# Patient Record
Sex: Male | Born: 1940 | Race: White | Hispanic: No | Marital: Married | Smoking: Never smoker
Health system: Southern US, Community
[De-identification: ages and names within clinical notes are randomized; demographics above are authoritative.]

## PROBLEM LIST (undated history)

## (undated) DIAGNOSIS — I10 Essential (primary) hypertension: Secondary | ICD-10-CM

## (undated) DIAGNOSIS — C189 Malignant neoplasm of colon, unspecified: Secondary | ICD-10-CM

## (undated) DIAGNOSIS — R1310 Dysphagia, unspecified: Secondary | ICD-10-CM

## (undated) DIAGNOSIS — I493 Ventricular premature depolarization: Secondary | ICD-10-CM

## (undated) DIAGNOSIS — E079 Disorder of thyroid, unspecified: Secondary | ICD-10-CM

## (undated) DIAGNOSIS — N183 Chronic kidney disease, stage 3 unspecified: Secondary | ICD-10-CM

## (undated) DIAGNOSIS — N131 Hydronephrosis with ureteral stricture, not elsewhere classified: Secondary | ICD-10-CM

## (undated) DIAGNOSIS — M316 Other giant cell arteritis: Secondary | ICD-10-CM

## (undated) DIAGNOSIS — F0150 Vascular dementia without behavioral disturbance: Secondary | ICD-10-CM

## (undated) DIAGNOSIS — K5909 Other constipation: Secondary | ICD-10-CM

## (undated) DIAGNOSIS — G7000 Myasthenia gravis without (acute) exacerbation: Secondary | ICD-10-CM

## (undated) DIAGNOSIS — R0600 Dyspnea, unspecified: Secondary | ICD-10-CM

## (undated) DIAGNOSIS — K219 Gastro-esophageal reflux disease without esophagitis: Secondary | ICD-10-CM

## (undated) DIAGNOSIS — I2510 Atherosclerotic heart disease of native coronary artery without angina pectoris: Secondary | ICD-10-CM

## (undated) DIAGNOSIS — I499 Cardiac arrhythmia, unspecified: Secondary | ICD-10-CM

## (undated) DIAGNOSIS — E785 Hyperlipidemia, unspecified: Secondary | ICD-10-CM

## (undated) DIAGNOSIS — I4891 Unspecified atrial fibrillation: Secondary | ICD-10-CM

## (undated) HISTORY — PX: COLONOSCOPY: SHX174

## (undated) HISTORY — PX: ESOPHAGOGASTRODUODENOSCOPY: SHX1529

## (undated) HISTORY — PX: COLON SURGERY: SHX602

## (undated) HISTORY — DX: Hyperlipidemia, unspecified: E78.5

## (undated) HISTORY — PX: BACK SURGERY: SHX140

## (undated) HISTORY — DX: Ventricular premature depolarization: I49.3

## (undated) HISTORY — DX: Unspecified atrial fibrillation (CMS HCC): I48.91

---

## 1993-03-25 DIAGNOSIS — C189 Malignant neoplasm of colon, unspecified: Secondary | ICD-10-CM

## 1993-03-25 DIAGNOSIS — C240 Malignant neoplasm of extrahepatic bile duct: Secondary | ICD-10-CM

## 1993-03-25 HISTORY — DX: Malignant neoplasm of extrahepatic bile duct: C24.0

## 1993-03-25 HISTORY — PX: LAPAROSCOPIC COLON RESECTION: SUR791

## 1993-03-25 HISTORY — DX: Malignant neoplasm of colon, unspecified: C18.9

## 1997-07-01 ENCOUNTER — Encounter: Admission: RE | Admit: 1997-07-01 | Discharge: 1997-07-01 | Payer: Self-pay | Admitting: Family Medicine

## 1998-03-25 DIAGNOSIS — C189 Malignant neoplasm of colon, unspecified: Secondary | ICD-10-CM

## 1998-03-25 HISTORY — DX: Malignant neoplasm of colon, unspecified: C18.9

## 2006-12-24 ENCOUNTER — Ambulatory Visit: Payer: Self-pay | Admitting: Unknown Physician Specialty

## 2011-07-26 ENCOUNTER — Ambulatory Visit: Payer: Self-pay

## 2011-12-26 ENCOUNTER — Ambulatory Visit: Payer: Self-pay | Admitting: Family Medicine

## 2012-03-31 ENCOUNTER — Ambulatory Visit: Payer: Self-pay | Admitting: Unknown Physician Specialty

## 2012-10-25 ENCOUNTER — Emergency Department: Payer: Self-pay | Admitting: Emergency Medicine

## 2013-02-25 ENCOUNTER — Ambulatory Visit: Payer: Self-pay

## 2013-04-12 ENCOUNTER — Other Ambulatory Visit: Payer: Self-pay | Admitting: Specialist

## 2013-04-16 LAB — WOUND CULTURE

## 2013-04-19 ENCOUNTER — Ambulatory Visit: Payer: Self-pay | Admitting: Physician Assistant

## 2013-10-20 ENCOUNTER — Emergency Department: Payer: Self-pay | Admitting: Emergency Medicine

## 2013-10-20 LAB — CBC WITH DIFFERENTIAL/PLATELET
Basophil #: 0.1 10*3/uL (ref 0.0–0.1)
Basophil %: 0.5 %
EOS PCT: 2.9 %
Eosinophil #: 0.3 10*3/uL (ref 0.0–0.7)
HCT: 41 % (ref 40.0–52.0)
HGB: 13.5 g/dL (ref 13.0–18.0)
LYMPHS ABS: 1.4 10*3/uL (ref 1.0–3.6)
LYMPHS PCT: 12.7 %
MCH: 30.4 pg (ref 26.0–34.0)
MCHC: 32.8 g/dL (ref 32.0–36.0)
MCV: 93 fL (ref 80–100)
MONOS PCT: 6.1 %
Monocyte #: 0.7 x10 3/mm (ref 0.2–1.0)
NEUTROS ABS: 8.5 10*3/uL — AB (ref 1.4–6.5)
NEUTROS PCT: 77.8 %
Platelet: 204 10*3/uL (ref 150–440)
RBC: 4.43 10*6/uL (ref 4.40–5.90)
RDW: 14.3 % (ref 11.5–14.5)
WBC: 10.9 10*3/uL — ABNORMAL HIGH (ref 3.8–10.6)

## 2013-10-20 LAB — URINALYSIS, COMPLETE
BACTERIA: NONE SEEN
BILIRUBIN, UR: NEGATIVE
GLUCOSE, UR: NEGATIVE mg/dL (ref 0–75)
KETONE: NEGATIVE
Leukocyte Esterase: NEGATIVE
Nitrite: NEGATIVE
PH: 5 (ref 4.5–8.0)
Protein: 30
RBC,UR: 154 /HPF (ref 0–5)
SPECIFIC GRAVITY: 1.028 (ref 1.003–1.030)
WBC UR: 5 /HPF (ref 0–5)

## 2013-10-20 LAB — COMPREHENSIVE METABOLIC PANEL
ALBUMIN: 3.6 g/dL (ref 3.4–5.0)
ALT: 23 U/L
AST: 27 U/L (ref 15–37)
Alkaline Phosphatase: 57 U/L
Anion Gap: 7 (ref 7–16)
BUN: 26 mg/dL — ABNORMAL HIGH (ref 7–18)
Bilirubin,Total: 0.8 mg/dL (ref 0.2–1.0)
CHLORIDE: 106 mmol/L (ref 98–107)
CREATININE: 1.83 mg/dL — AB (ref 0.60–1.30)
Calcium, Total: 8.4 mg/dL — ABNORMAL LOW (ref 8.5–10.1)
Co2: 30 mmol/L (ref 21–32)
EGFR (African American): 42 — ABNORMAL LOW
EGFR (Non-African Amer.): 37 — ABNORMAL LOW
Glucose: 103 mg/dL — ABNORMAL HIGH (ref 65–99)
OSMOLALITY: 290 (ref 275–301)
Potassium: 4.2 mmol/L (ref 3.5–5.1)
Sodium: 143 mmol/L (ref 136–145)
Total Protein: 6.8 g/dL (ref 6.4–8.2)

## 2013-10-20 LAB — LIPASE, BLOOD: Lipase: 126 U/L (ref 73–393)

## 2014-02-02 ENCOUNTER — Ambulatory Visit (INDEPENDENT_AMBULATORY_CARE_PROVIDER_SITE_OTHER): Payer: Medicare Other | Admitting: Family Medicine

## 2014-02-02 ENCOUNTER — Encounter: Payer: Self-pay | Admitting: Family Medicine

## 2014-02-02 ENCOUNTER — Encounter (INDEPENDENT_AMBULATORY_CARE_PROVIDER_SITE_OTHER): Payer: Self-pay

## 2014-02-02 VITALS — BP 145/73 | HR 75 | Ht 70.0 in | Wt 145.0 lb

## 2014-02-02 DIAGNOSIS — M722 Plantar fascial fibromatosis: Secondary | ICD-10-CM

## 2014-02-02 NOTE — Patient Instructions (Signed)
You have plantar fasciitis Plantar fascia stretch for 20-30 seconds (do 3 of these) in morning Lowering/raise on a step exercises 3 x 10 once or twice a day - this is very important for long term recovery. Can add heel walks, toe walks forward and backward as well Ice heel for 15 minutes as needed. Avoid flat shoes/barefoot walking as much as possible. Arch straps have been shown to help with pain - continue using these. Continue using the over the counter orthotics (dr. Zoe Guzman). Things to consider moving forward: more extensive physical therapy, night splints, repeat cortisone injection, custom orthotics, immobilization in a boot for a period of 4-6 weeks, active release of the plantar fascia. Call me if you're not improving and want to consider one of the above. Otherwise follow-up with me in 1 month to 6 weeks.

## 2014-02-03 DIAGNOSIS — M722 Plantar fascial fibromatosis: Secondary | ICD-10-CM | POA: Insufficient documentation

## 2014-02-03 NOTE — Assessment & Plan Note (Signed)
patient has tried several conservative measures for this including arch binder, physical therapy, taping, dr. Zoe Lan orthotics, and an injection.  Shown home exercises to do daily (was not doing eccentrics).  Icing, arch straps and orthotics.  We also discussed more extensive physical therapy, night splints, repeating injection, orthotics, immobilization.  F/u in 1 month to 6 weeks.

## 2014-02-03 NOTE — Progress Notes (Signed)
PCP: No primary care provider on file.  Subjective:   HPI: Patient is a 73 y.o. male here for right foot/heel pain.  Patient is a runner. States he was up to running about 10 miles in July. No known injury. Pain bottom of right heel developed. Unable to run due to pain. He has tried physical therapy, arch binder, taping, dr. Zoe Lan orthotics, cortisone injection. Has not done step exercise.  No past medical history on file.  No current outpatient prescriptions on file prior to visit.   No current facility-administered medications on file prior to visit.    No past surgical history on file.  No Known Allergies  History   Social History  . Marital Status: Married    Spouse Name: N/A    Number of Children: N/A  . Years of Education: N/A   Occupational History  . Not on file.   Social History Main Topics  . Smoking status: Never Smoker   . Smokeless tobacco: Not on file  . Alcohol Use: Not on file  . Drug Use: Not on file  . Sexual Activity: Not on file   Other Topics Concern  . Not on file   Social History Narrative  . No narrative on file    No family history on file.  BP 145/73 mmHg  Pulse 75  Ht 5\' 10"  (1.778 m)  Wt 145 lb (65.772 kg)  BMI 20.81 kg/m2  Review of Systems: See HPI above.    Objective:  Physical Exam:  Gen: NAD  Right foot/ankle: No gross deformity, swelling, ecchymoses FROM TTP plantar fascia at insertion on calcaneus Negative ant drawer and talar tilt.   Negative syndesmotic compression. Negative calcaneal squeeze. Thompsons test negative. NV intact distally.    Assessment & Plan:  1. Right plantar fasciitis - patient has tried several conservative measures for this including arch binder, physical therapy, taping, dr. Zoe Lan orthotics, and an injection.  Shown home exercises to do daily (was not doing eccentrics).  Icing, arch straps and orthotics.  We also discussed more extensive physical therapy, night splints,  repeating injection, orthotics, immobilization.  F/u in 1 month to 6 weeks.

## 2014-04-05 DIAGNOSIS — H6123 Impacted cerumen, bilateral: Secondary | ICD-10-CM | POA: Diagnosis not present

## 2014-04-05 DIAGNOSIS — Z23 Encounter for immunization: Secondary | ICD-10-CM | POA: Diagnosis not present

## 2014-04-05 DIAGNOSIS — E780 Pure hypercholesterolemia: Secondary | ICD-10-CM | POA: Diagnosis not present

## 2014-10-04 ENCOUNTER — Ambulatory Visit: Payer: Self-pay | Admitting: Family Medicine

## 2014-10-13 ENCOUNTER — Telehealth: Payer: Self-pay | Admitting: Family Medicine

## 2014-10-13 DIAGNOSIS — H6120 Impacted cerumen, unspecified ear: Secondary | ICD-10-CM | POA: Insufficient documentation

## 2014-10-13 DIAGNOSIS — E7800 Pure hypercholesterolemia, unspecified: Secondary | ICD-10-CM

## 2014-10-13 DIAGNOSIS — C189 Malignant neoplasm of colon, unspecified: Secondary | ICD-10-CM

## 2014-10-13 NOTE — Telephone Encounter (Signed)
Patient will be put on MAC schedule for 7/25 @11am  MAC coming in office to clean out box before leaving for vacation

## 2014-10-13 NOTE — Telephone Encounter (Signed)
Pt came in and wanted to know if he could get a referral to go to physical therapy. He went this morning hoping that we would be able to get the referral done. He would like to go to [facility] physical therapy on [address]. Pt would like a call back at [phone].

## 2014-10-13 NOTE — Telephone Encounter (Signed)
Pt called stated MAC instructed him to make an appt for next Tuesday. Pt stated seeing Scott Guzman would be fine with him. Please advise on which provider I should schedule him with. Thanks.

## 2014-10-17 ENCOUNTER — Ambulatory Visit: Payer: Self-pay | Admitting: Family Medicine

## 2014-10-19 ENCOUNTER — Ambulatory Visit: Payer: Self-pay | Admitting: Unknown Physician Specialty

## 2014-10-31 ENCOUNTER — Ambulatory Visit (INDEPENDENT_AMBULATORY_CARE_PROVIDER_SITE_OTHER): Payer: 59 | Admitting: Family Medicine

## 2014-10-31 ENCOUNTER — Encounter: Payer: Self-pay | Admitting: Family Medicine

## 2014-10-31 VITALS — BP 117/78 | HR 54 | Temp 97.3°F | Ht 70.0 in | Wt 139.0 lb

## 2014-10-31 DIAGNOSIS — G4700 Insomnia, unspecified: Secondary | ICD-10-CM

## 2014-10-31 DIAGNOSIS — T148XXA Other injury of unspecified body region, initial encounter: Secondary | ICD-10-CM

## 2014-10-31 DIAGNOSIS — T148 Other injury of unspecified body region: Secondary | ICD-10-CM

## 2014-10-31 MED ORDER — MIRTAZAPINE 30 MG PO TABS: 30.0000 mg | ORAL_TABLET | Freq: Every day | ORAL | Status: DC

## 2014-10-31 MED ORDER — MELOXICAM 15 MG PO TABS: 15.0000 mg | ORAL_TABLET | Freq: Every day | ORAL | Status: DC | PRN

## 2014-10-31 NOTE — Progress Notes (Signed)
   BP 117/78 mmHg  Pulse 54  Temp(Src) 97.3 F (36.3 C)  Ht 5\' 10"  (1.778 m)  Wt 139 lb (63.05 kg)  BMI 19.94 kg/m2  SpO2 99%   Subjective:    Patient ID: Scott Guzman, male    DOB: 1940/08/13, 74 y.o.   MRN: [id number]  HPI: Scott Guzman is a 74 y.o. male  No chief complaint on file.  patient follow-up medication doing well taking occasional meloxicam for overuse injury with his running and sprinting. Patient taking Remeron 80 mg to help with sleep breaks in half for 15 mg mostly and does well. Takes medications with no side effects  Relevant past medical, surgical, family and social history reviewed and updated as indicated. Interim medical history since our last visit reviewed. Allergies and medications reviewed and updated.  Review of Systems  Respiratory: Negative.   Cardiovascular: Negative.     Per HPI unless specifically indicated above     Objective:    BP 117/78 mmHg  Pulse 54  Temp(Src) 97.3 F (36.3 C)  Ht 5\' 10"  (1.778 m)  Wt 139 lb (63.05 kg)  BMI 19.94 kg/m2  SpO2 99%  Wt Readings from Last 3 Encounters:  10/31/14 139 lb (63.05 kg)  04/05/14 142 lb (64.411 kg)  02/02/14 145 lb (65.772 kg)    Physical Exam  Constitutional: He is oriented to person, place, and time. He appears well-developed and well-nourished. No distress.  HENT:  Head: Normocephalic and atraumatic.  Right Ear: Hearing normal.  Left Ear: Hearing normal.  Nose: Nose normal.  Eyes: Conjunctivae and lids are normal. Right eye exhibits no discharge. Left eye exhibits no discharge. No scleral icterus.  Cardiovascular: Normal rate and regular rhythm.   Pulmonary/Chest: Effort normal and breath sounds normal. No respiratory distress.  Musculoskeletal: Normal range of motion.  Neurological: He is alert and oriented to person, place, and time.  Skin: Skin is intact. No rash noted.  Psychiatric: He has a normal mood and affect. His speech is normal and behavior is normal. Judgment and  thought content normal. Cognition and memory are normal.        Assessment & Plan:   Problem List Items Addressed This Visit      Other   Insomnia    Other Visit Diagnoses    Muscle strain    -  Primary    Continue occasional meloxicam may need physical therapy from time to time        Follow up plan: Return in about 6 months (around 05/03/2015) for Physical Exam.

## 2014-11-10 ENCOUNTER — Telehealth: Payer: Self-pay

## 2014-11-10 MED ORDER — MIRTAZAPINE 30 MG PO TABS: 30.0000 mg | ORAL_TABLET | Freq: Every day | ORAL | Status: DC

## 2014-11-10 MED ORDER — MELOXICAM 15 MG PO TABS: 15.0000 mg | ORAL_TABLET | Freq: Every day | ORAL | Status: DC | PRN

## 2014-11-10 NOTE — Telephone Encounter (Signed)
Patient has requested 90 day Rx's for Meloxicam and Mirtazapine  [facility] will need new 90day Rx's

## 2015-02-10 ENCOUNTER — Encounter: Payer: Self-pay | Admitting: Family Medicine

## 2015-02-15 ENCOUNTER — Emergency Department
Admission: EM | Admit: 2015-02-15 | Discharge: 2015-02-15 | Disposition: A | Discharge status: Home / Self Care | Payer: Medicare Other | Attending: Emergency Medicine | Admitting: Emergency Medicine

## 2015-02-15 ENCOUNTER — Encounter: Payer: Self-pay | Admitting: Emergency Medicine

## 2015-02-15 DIAGNOSIS — R5381 Other malaise: Secondary | ICD-10-CM | POA: Insufficient documentation

## 2015-02-15 DIAGNOSIS — F419 Anxiety disorder, unspecified: Secondary | ICD-10-CM | POA: Insufficient documentation

## 2015-02-15 DIAGNOSIS — Z7982 Long term (current) use of aspirin: Secondary | ICD-10-CM | POA: Diagnosis not present

## 2015-02-15 DIAGNOSIS — R002 Palpitations: Secondary | ICD-10-CM

## 2015-02-15 DIAGNOSIS — R110 Nausea: Secondary | ICD-10-CM | POA: Insufficient documentation

## 2015-02-15 DIAGNOSIS — Z79899 Other long term (current) drug therapy: Secondary | ICD-10-CM | POA: Insufficient documentation

## 2015-02-15 DIAGNOSIS — R42 Dizziness and giddiness: Secondary | ICD-10-CM | POA: Diagnosis present

## 2015-02-15 LAB — CBC
HCT: 42.5 % (ref 40.0–52.0)
HEMOGLOBIN: 14 g/dL (ref 13.0–18.0)
MCH: 30 pg (ref 26.0–34.0)
MCHC: 33 g/dL (ref 32.0–36.0)
MCV: 90.8 fL (ref 80.0–100.0)
Platelets: 222 10*3/uL (ref 150–440)
RBC: 4.68 MIL/uL (ref 4.40–5.90)
RDW: 14.2 % (ref 11.5–14.5)
WBC: 9.4 10*3/uL (ref 3.8–10.6)

## 2015-02-15 LAB — GLUCOSE, CAPILLARY: Glucose-Capillary: 129 mg/dL — ABNORMAL HIGH (ref 65–99)

## 2015-02-15 LAB — BASIC METABOLIC PANEL
ANION GAP: 9 (ref 5–15)
BUN: 27 mg/dL — ABNORMAL HIGH (ref 6–20)
CALCIUM: 9.2 mg/dL (ref 8.9–10.3)
CO2: 25 mmol/L (ref 22–32)
CREATININE: 1.36 mg/dL — AB (ref 0.61–1.24)
Chloride: 104 mmol/L (ref 101–111)
GFR calc Af Amer: 58 mL/min — ABNORMAL LOW (ref >60)
GFR calc non Af Amer: 50 mL/min — ABNORMAL LOW (ref >60)
GLUCOSE: 140 mg/dL — AB (ref 65–99)
Potassium: 4.5 mmol/L (ref 3.5–5.1)
Sodium: 138 mmol/L (ref 135–145)

## 2015-02-15 LAB — MAGNESIUM: Magnesium: 2.1 mg/dL (ref 1.7–2.4)

## 2015-02-15 LAB — TROPONIN I

## 2015-02-15 LAB — CK: Total CK: 204 U/L (ref 49–397)

## 2015-02-15 MED ORDER — SODIUM CHLORIDE 0.9 % IV BOLUS (SEPSIS)
1000.0000 mL | Freq: Once | INTRAVENOUS | Status: AC
  Administered 2015-02-15: 1000 mL via INTRAVENOUS

## 2015-02-15 MED ORDER — LORAZEPAM 2 MG/ML IJ SOLN
1.0000 mg | Freq: Once | INTRAMUSCULAR | Status: AC
  Administered 2015-02-15: 1 mg via INTRAVENOUS
  Filled 2015-02-15: qty 1

## 2015-02-15 NOTE — ED Notes (Signed)
Patient ambulatory to triage with steady gait, without difficulty or distress noted; pt reports taken an energy supplement at 5pm with subsequent onset of nausea and feeling light-headed

## 2015-02-15 NOTE — ED Notes (Signed)
Patient discharged to home per MD order. Patient in stable condition, and deemed medically cleared by ED provider for discharge. Discharge instructions reviewed with patient/family using "Teach Back"; verbalized understanding of medication education and administration, and information about follow-up care. Denies further concerns. ° °

## 2015-02-15 NOTE — ED Provider Notes (Signed)
[facility] Emergency Department Provider Note  ____________________________________________  Time seen: Approximately 8:46 PM  I have reviewed the triage vital signs and the nursing notes.   HISTORY  Chief Complaint Nausea and Dizziness    HPI Scott Guzman is a 74 y.o. male with a distant history of colon cancer and mild hyperlipidemia but who is otherwise in excellent health and runs sprints regularly.  He presents today with general malaise and lightheadedness after taking a workout supplement about 4 hours prior to arrival.  He is taking the supplement once in the past and did not have any negative side effects, but shortly after taking the supplement today he had a great burst of energy and an "incredible work out".  When he got back he still had a lot of energy and did a bunch of work around the house.  He started to feel ill shortly thereafter with some nausea and general malaise.  He denies chest pain, shortness of breath, abdominal pain, dysuria, headache, and is not able to specifically identify what he is feeling except that he does not feel right and feels possibly some palpitations.He was very anxious about what may be happening to him so he asked his wife to bring him to the emergency department.  He currently states that he is feeling better but is still feeling some of the uneasy felt previously.  He is not having any pain in his extremities although he says that his legs are trembling.  He denies any focal numbness or weakness.     Past Medical History  Diagnosis Date  . Hyperlipidemia   . Cancer [facility])     colon    Patient Active Problem List   Diagnosis Date Noted  . Insomnia 10/31/2014  . Hypercholesterolemia 10/13/2014  . Colon cancer ([place]) 10/13/2014    Past Surgical History  Procedure Laterality Date  . Colon surgery      Current Outpatient Rx  Name  Route  Sig  Dispense  Refill  . aspirin 325 MG EC tablet   Oral   Take 325  mg by mouth daily.         . meloxicam (MOBIC) 15 MG tablet   Oral   Take 1 tablet (15 mg total) by mouth daily as needed for pain.   90 tablet   2   . mirtazapine (REMERON) 30 MG tablet   Oral   Take 1 tablet (30 mg total) by mouth at bedtime.   90 tablet   2   . Multiple Vitamin (MULTIVITAMIN) tablet   Oral   Take 1 tablet by mouth daily.           Allergies Review of patient's allergies indicates no known allergies.  No family history on file.  Social History Social History  Substance Use Topics  . Smoking status: Never Smoker   . Smokeless tobacco: Never Used  . Alcohol Use: No    Review of Systems Constitutional: No fever/chills.  General malaise and unease Eyes: No visual changes. ENT: No sore throat. Cardiovascular: Denies chest pain.  Possible palpitations.  Feels like his heart is racing Respiratory: Denies shortness of breath. Gastrointestinal: No abdominal pain.  nausea, no vomiting.  No diarrhea.  No constipation. Genitourinary: Negative for dysuria. Musculoskeletal: Negative for back pain. Skin: Negative for rash. Neurological: Negative for headaches, focal weakness or numbness.  10-point ROS otherwise negative.  ____________________________________________   PHYSICAL EXAM:  VITAL SIGNS: ED Triage Vitals  Enc Vitals Group  BP 02/15/15 2024 144/88 mmHg     Pulse Rate 02/15/15 2024 87     Resp 02/15/15 2024 18     Temp 02/15/15 2024 97.7 F (36.5 C)     Temp Source 02/15/15 2024 Oral     SpO2 02/15/15 2024 98 %     Weight 02/15/15 2024 140 lb (63.504 kg)     Height 02/15/15 2024 5\' 10"  (1.778 m)     Head Cir --      Peak Flow --      Pain Score --      Pain Loc --      Pain Edu? --      Excl. in [place]? --     Constitutional: Alert and oriented.  Very healthy and appears younger than stated age.  Anxious. Eyes: Conjunctivae are normal. PERRL. EOMI. Head: Atraumatic. Nose: No congestion/rhinnorhea. Mouth/Throat: Mucous membranes  are moist.  Oropharynx non-erythematous. Neck: No stridor.   Cardiovascular: Normal rate, regular rhythm. Grossly normal heart sounds.  Good peripheral circulation. Respiratory: Normal respiratory effort.  No retractions. Lungs CTAB. Gastrointestinal: Soft and nontender. No distention. No abdominal bruits. No CVA tenderness. Musculoskeletal: No lower extremity tenderness nor edema.  No joint effusions. Neurologic:  Normal speech and language. No gross focal neurologic deficits are appreciated.  Skin:  Skin is warm, dry and intact. No rash noted. Psychiatric: Mood and affect are slightly anxious. Speech and behavior are normal.  ____________________________________________   LABS (all labs ordered are listed, but only abnormal results are displayed)  Labs Reviewed  BASIC METABOLIC PANEL - Abnormal; Notable for the following:    Glucose, Bld 140 (*)    BUN 27 (*)    Creatinine, Ser 1.36 (*)    GFR calc non Af Amer 50 (*)    GFR calc Af Amer 58 (*)    All other components within normal limits  GLUCOSE, CAPILLARY - Abnormal; Notable for the following:    Glucose-Capillary 129 (*)    All other components within normal limits  CBC  TROPONIN I  CK  MAGNESIUM  URINALYSIS COMPLETEWITH MICROSCOPIC ([facility] ONLY)  CBG MONITORING, ED   ____________________________________________  EKG  ED ECG REPORT I, Shawnelle Spoerl, the attending physician, personally viewed and interpreted this ECG.  Date: 02/15/2015 EKG Time: 20:33 Rate: 86 Rhythm: normal sinus rhythm QRS Axis: normal Intervals: normal ST/T Wave abnormalities: Non-specific ST segment / T-wave changes, but no evidence of acute ischemia. Conduction Disutrbances: none Narrative Interpretation: unremarkable  ____________________________________________  RADIOLOGY   No results found.  ____________________________________________   PROCEDURES  Procedure(s) performed: None  Critical Care performed:  No ____________________________________________   INITIAL IMPRESSION / ASSESSMENT AND PLAN / ED COURSE  Pertinent labs & imaging results that were available during my care of the patient were reviewed by me and considered in my medical decision making (see chart for details).  I strongly doubt ACS, but the patient may be suffering from rhabdo or electrolyte abnormalities.  I will start treating with a liter of fluids and will likely give a second liter while awaiting lab results.  Given his level of anxiety and the fact that a heart rate of the mid to upper 80s for him is likely significantly elevated since he is a longtime runner and has a resting heart rate in the 40s or 50s, I will give him Ativan 1 mg which I think will help improve the symptoms as well.  He and his wife understand and agree with this plan.  -----------------------------------------  10:56 PM on 02/15/2015 -----------------------------------------  The patient feels much better at this time and is "pleasantly sleepy".  His vague feelings of unease and palpitations have completely resolved.  I asked him if he would like another liter of fluids and he states that he feels ready to go.  His labs are all within normal limits. (Of note, his magnesium and CK were originally listed as 11/22 instead of 11/23, but we are contacting the lab to have the correct this).  His creatinine is similar to a prior that we have on file for him.  I gave my usual and customary return precautions.    ____________________________________________  FINAL CLINICAL IMPRESSION(S) / ED DIAGNOSES  Final diagnoses:  Heart palpitations      NEW MEDICATIONS STARTED DURING THIS VISIT:  New Prescriptions   No medications on file     Hinda Kehr, MD 02/15/15 2301

## 2015-02-15 NOTE — ED Notes (Signed)
Mallie Mussel RN called the lab to correct the erroneous date listed for the CK and Magnesium. The lab supervisor stated that it has been corrected and it should show within 30 min. (The CK and the Magnesium were collected and resulted on Nov.23, 2013)

## 2015-02-15 NOTE — Discharge Instructions (Signed)
As we discussed, your workup today was reassuring.  Though we do not know exactly what is causing your symptoms, it appears that you have no emergent medical condition at this time are safe to go home and follow up as recommended in this paperwork.  Please do not take any more of that supplement, and drink plenty of clear fluids.  Please return immediately to the Emergency Department if you develop any new or worsening symptoms that concern you.   Palpitations A palpitation is the feeling that your heartbeat is irregular or is faster than normal. It may feel like your heart is fluttering or skipping a beat. Palpitations are usually not a serious problem. However, in some cases, you may need further medical evaluation. CAUSES  Palpitations can be caused by:  Smoking.  Caffeine or other stimulants, such as diet pills or energy drinks.  Alcohol.  Stress and anxiety.  Strenuous physical activity.  Fatigue.  Certain medicines.  Heart disease, especially if you have a history of irregular heart rhythms (arrhythmias), such as atrial fibrillation, atrial flutter, or supraventricular tachycardia.  An improperly working pacemaker or defibrillator. DIAGNOSIS  To find the cause of your palpitations, your health care provider will take your medical history and perform a physical exam. Your health care provider may also have you take a test called an ambulatory electrocardiogram (ECG). An ECG records your heartbeat patterns over a 24-hour period. You may also have other tests, such as:  Transthoracic echocardiogram (TTE). During echocardiography, sound waves are used to evaluate how blood flows through your heart.  Transesophageal echocardiogram (TEE).  Cardiac monitoring. This allows your health care provider to monitor your heart rate and rhythm in real time.  Holter monitor. This is a portable device that records your heartbeat and can help diagnose heart arrhythmias. It allows your health  care provider to track your heart activity for several days, if needed.  Stress tests by exercise or by giving medicine that makes the heart beat faster. TREATMENT  Treatment of palpitations depends on the cause of your symptoms and can vary greatly. Most cases of palpitations do not require any treatment other than time, relaxation, and monitoring your symptoms. Other causes, such as atrial fibrillation, atrial flutter, or supraventricular tachycardia, usually require further treatment. HOME CARE INSTRUCTIONS   Avoid:  Caffeinated coffee, tea, soft drinks, diet pills, and energy drinks.  Chocolate.  Alcohol.  Stop smoking if you smoke.  Reduce your stress and anxiety. Things that can help you relax include:  A method of controlling things in your body, such as your heartbeats, with your mind (biofeedback).  Yoga.  Meditation.  Physical activity such as swimming, jogging, or walking.  Get plenty of rest and sleep. SEEK MEDICAL CARE IF:   You continue to have a fast or irregular heartbeat beyond 24 hours.  Your palpitations occur more often. SEEK IMMEDIATE MEDICAL CARE IF:  You have chest pain or shortness of breath.  You have a severe headache.  You feel dizzy or you faint. MAKE SURE YOU:  Understand these instructions.  Will watch your condition.  Will get help right away if you are not doing well or get worse.   This information is not intended to replace advice given to you by your health care provider. Make sure you discuss any questions you have with your health care provider.   Document Released: 03/08/2000 Document Revised: 03/16/2013 Document Reviewed: 05/10/2011 Elsevier Interactive Patient Education [employer].

## 2015-02-16 MED ORDER — ONDANSETRON HCL 4 MG/2ML IJ SOLN
INTRAMUSCULAR | Status: AC
  Filled 2015-02-16: qty 2

## 2015-03-08 DIAGNOSIS — H2513 Age-related nuclear cataract, bilateral: Secondary | ICD-10-CM | POA: Diagnosis not present

## 2015-04-10 ENCOUNTER — Encounter: Payer: 59 | Admitting: Family Medicine

## 2015-04-24 ENCOUNTER — Telehealth: Payer: Self-pay | Admitting: Family Medicine

## 2015-04-24 NOTE — Telephone Encounter (Signed)
Work in [place] in AM

## 2015-04-24 NOTE — Telephone Encounter (Signed)
Pt called in and stated he was having pains in his head. He said he'd been having this pain for the last 3-4 days about once or twice a day. There were no appts available today and when i mentioned urgent care or the er the pt asked to just have MAC call him.

## 2015-04-25 ENCOUNTER — Encounter: Payer: Self-pay | Admitting: Family Medicine

## 2015-04-25 ENCOUNTER — Ambulatory Visit (INDEPENDENT_AMBULATORY_CARE_PROVIDER_SITE_OTHER): Payer: 59 | Admitting: Family Medicine

## 2015-04-25 VITALS — BP 115/71 | HR 68 | Temp 97.8°F | Ht 69.4 in | Wt 141.0 lb

## 2015-04-25 DIAGNOSIS — R202 Paresthesia of skin: Secondary | ICD-10-CM

## 2015-04-25 DIAGNOSIS — Z23 Encounter for immunization: Secondary | ICD-10-CM | POA: Diagnosis not present

## 2015-04-25 DIAGNOSIS — R200 Anesthesia of skin: Secondary | ICD-10-CM

## 2015-04-25 NOTE — Assessment & Plan Note (Addendum)
We'll observe symptoms Check sedimentation rate and BMP to rule out temporal arteritis Will observe patient education given on potential for shingles and rash urgency of treatment if so

## 2015-04-25 NOTE — Progress Notes (Signed)
BP 115/71 mmHg  Pulse 68  Temp(Src) 97.8 F (36.6 C)  Ht 5' 9.4" (1.763 m)  Wt 141 lb (63.957 kg)  BMI 20.58 kg/m2  SpO2 99%   Subjective:    Patient ID: Scott Guzman, male    DOB: 05-14-1940, 75 y.o.   MRN: [id number]  HPI: Scott Guzman is a 75 y.o. male  Chief Complaint  Patient presents with  . Headache    right side, temple   Patient yesterday had 2 spells of marked tingling in the right temporal area lasting 10 seconds or so No noted rash no trauma no visual complaints no oral complaints no other numbness. Hasn't been okay today Has not been taking any medications  Relevant past medical, surgical, family and social history reviewed and updated as indicated. Interim medical history since our last visit reviewed. Allergies and medications reviewed and updated.  Review of Systems  Constitutional: Negative.   HENT: Negative.   Eyes: Negative.   Respiratory: Negative.   Cardiovascular: Negative.   Gastrointestinal: Negative.   Endocrine: Negative.   Musculoskeletal: Negative.     Per HPI unless specifically indicated above     Objective:    BP 115/71 mmHg  Pulse 68  Temp(Src) 97.8 F (36.6 C)  Ht 5' 9.4" (1.763 m)  Wt 141 lb (63.957 kg)  BMI 20.58 kg/m2  SpO2 99%  Wt Readings from Last 3 Encounters:  04/25/15 141 lb (63.957 kg)  02/15/15 140 lb (63.504 kg)  10/31/14 139 lb (63.05 kg)    Physical Exam  Constitutional: He is oriented to person, place, and time. He appears well-developed and well-nourished. No distress.  HENT:  Head: Normocephalic and atraumatic.  Right Ear: Hearing and external ear normal.  Left Ear: Hearing and external ear normal.  Nose: Nose normal.  Mouth/Throat: Oropharynx is clear and moist. No oropharyngeal exudate.  Teeth and jaw with no lesions  Eyes: Conjunctivae and lids are normal. Pupils are equal, round, and reactive to light. Right eye exhibits no discharge. Left eye exhibits no discharge. No scleral icterus.  Neck:  Normal range of motion. No tracheal deviation present. No thyromegaly present.  Cardiovascular: Normal rate, regular rhythm and normal heart sounds.   Pulmonary/Chest: Effort normal and breath sounds normal. No respiratory distress.  Musculoskeletal: Normal range of motion.  Lymphadenopathy:    He has no cervical adenopathy.  Neurological: He is alert and oriented to person, place, and time. No cranial nerve deficit. He exhibits normal muscle tone. Coordination normal.  Skin: Skin is intact. No rash noted.  No rash in right temple area no tenderness over temporal artery   Psychiatric: He has a normal mood and affect. His speech is normal and behavior is normal. Judgment and thought content normal. Cognition and memory are normal.    Results for orders placed or performed during the hospital encounter of [id number]  Basic metabolic panel  Result Value Ref Range   Sodium 138 135 - 145 mmol/L   Potassium 4.5 3.5 - 5.1 mmol/L   Chloride 104 101 - 111 mmol/L   CO2 25 22 - 32 mmol/L   Glucose, Bld 140 (H) 65 - 99 mg/dL   BUN 27 (H) 6 - 20 mg/dL   Creatinine, Ser 1.36 (H) 0.61 - 1.24 mg/dL   Calcium 9.2 8.9 - 10.3 mg/dL   GFR calc non Af Amer 50 (L) >60 mL/min   GFR calc Af Amer 58 (L) >60 mL/min   Anion gap 9 5 -  15  CBC  Result Value Ref Range   WBC 9.4 3.8 - 10.6 K/uL   RBC 4.68 4.40 - 5.90 MIL/uL   Hemoglobin 14.0 13.0 - 18.0 g/dL   HCT 42.5 40.0 - 52.0 %   MCV 90.8 80.0 - 100.0 fL   MCH 30.0 26.0 - 34.0 pg   MCHC 33.0 32.0 - 36.0 g/dL   RDW 14.2 11.5 - 14.5 %   Platelets 222 150 - 440 K/uL  Troponin I  Result Value Ref Range   Troponin I <0.03 <0.031 ng/mL  Glucose, capillary  Result Value Ref Range   Glucose-Capillary 129 (H) 65 - 99 mg/dL  CK  Result Value Ref Range   Total CK 204 49 - 397 U/L  Magnesium  Result Value Ref Range   Magnesium 2.1 1.7 - 2.4 mg/dL      Assessment & Plan:   Problem List Items Addressed This Visit      Other   Numbness and tingling of  right face    We'll observe symptoms Check sedimentation rate and BMP to rule out temporal arteritis Will observe patient education given on potential for shingles and rash urgency of treatment if so       Relevant Orders   Basic metabolic panel   Reticulocytes    Other Visit Diagnoses    Immunization due    -  Primary    Relevant Orders    Flu Vaccine QUAD 36+ mos PF IM (Fluarix & Fluzone Quad PF) (Completed)        Follow up plan: Return if symptoms worsen or fail to improve, for As scheduled.

## 2015-04-26 ENCOUNTER — Encounter: Payer: Self-pay | Admitting: Family Medicine

## 2015-04-26 LAB — BASIC METABOLIC PANEL
BUN/Creatinine Ratio: 20 (ref 10–22)
BUN: 24 mg/dL (ref 8–27)
CO2: 26 mmol/L (ref 18–29)
Calcium: 9 mg/dL (ref 8.6–10.2)
Chloride: 101 mmol/L (ref 96–106)
Creatinine, Ser: 1.18 mg/dL (ref 0.76–1.27)
GFR, EST AFRICAN AMERICAN: 71 mL/min/{1.73_m2} (ref >59)
GFR, EST NON AFRICAN AMERICAN: 61 mL/min/{1.73_m2} (ref >59)
GLUCOSE: 81 mg/dL (ref 65–99)
Potassium: 5 mmol/L (ref 3.5–5.2)
Sodium: 141 mmol/L (ref 134–144)

## 2015-04-26 LAB — RETICULOCYTES: Retic Ct Pct: 0.5 % — ABNORMAL LOW (ref 0.6–2.6)

## 2015-06-27 ENCOUNTER — Other Ambulatory Visit: Payer: Self-pay | Admitting: Family Medicine

## 2015-10-02 ENCOUNTER — Encounter: Payer: Self-pay | Admitting: Family Medicine

## 2015-10-02 ENCOUNTER — Ambulatory Visit (INDEPENDENT_AMBULATORY_CARE_PROVIDER_SITE_OTHER): Payer: 59 | Admitting: Family Medicine

## 2015-10-02 VITALS — BP 102/48 | HR 67 | Temp 97.7°F | Ht 69.4 in | Wt 137.0 lb

## 2015-10-02 DIAGNOSIS — K5900 Constipation, unspecified: Secondary | ICD-10-CM | POA: Diagnosis not present

## 2015-10-02 NOTE — Patient Instructions (Signed)

## 2015-10-02 NOTE — Progress Notes (Signed)
BP 102/48 mmHg  Pulse 67  Temp(Src) 97.7 F (36.5 C)  Ht 5' 9.4" (1.763 m)  Wt 137 lb (62.143 kg)  BMI 19.99 kg/m2  SpO2 99%   Subjective:    Patient ID: Scott Guzman, male    DOB: 21-Nov-1940, 75 y.o.   MRN: [id number]  HPI: Scott Guzman is a 76 y.o. male  Chief Complaint  Patient presents with  . Constipation    Patient has several questions about constipation   ABDOMINAL ISSUES- Started using miralax at home and that really helped clear him out. Feeling much better. Yesterday, started to take 2 doses of miralax and only 1 dose today, has not been drinking a lot of water. Has been having problems with having pellets for his constipation. He is going to see GI next week.  Duration: several days Nature: bloating, aching, sharp Location: diffuse  Severity: moderate  Radiation: no Frequency: intermittent Alleviating factors: miralax Aggravating factors: stopping running Treatments attempted: laxatives Constipation: yes Diarrhea: no Mucous in the stool: no Heartburn: no Bloating:no Flatulence: yes Nausea: no Vomiting: no Melena or hematochezia: no Rash: no Jaundice: no Fever: no Weight loss: yes, unsure, just on exam today  Relevant past medical, surgical, family and social history reviewed and updated as indicated. Interim medical history since our last visit reviewed. Allergies and medications reviewed and updated.  Review of Systems  Constitutional: Negative.   Respiratory: Negative.   Cardiovascular: Negative.   Gastrointestinal: Positive for abdominal pain and constipation. Negative for nausea, vomiting, diarrhea, blood in stool, abdominal distention, anal bleeding and rectal pain.  Psychiatric/Behavioral: Negative.     Per HPI unless specifically indicated above     Objective:    BP 102/48 mmHg  Pulse 67  Temp(Src) 97.7 F (36.5 C)  Ht 5' 9.4" (1.763 m)  Wt 137 lb (62.143 kg)  BMI 19.99 kg/m2  SpO2 99%  Wt Readings from Last 3 Encounters:   10/02/15 137 lb (62.143 kg)  04/25/15 141 lb (63.957 kg)  02/15/15 140 lb (63.504 kg)    Physical Exam  Constitutional: He is oriented to person, place, and time. He appears well-developed and well-nourished. No distress.  HENT:  Head: Normocephalic and atraumatic.  Right Ear: Hearing normal.  Left Ear: Hearing normal.  Nose: Nose normal.  Eyes: Conjunctivae and lids are normal. Right eye exhibits no discharge. Left eye exhibits no discharge. No scleral icterus.  Cardiovascular: Normal rate, regular rhythm, normal heart sounds and intact distal pulses.  Exam reveals no gallop and no friction rub.   No murmur heard. Pulmonary/Chest: Effort normal and breath sounds normal. No respiratory distress. He has no wheezes. He has no rales. He exhibits no tenderness.  Abdominal: Soft. Bowel sounds are normal. He exhibits no distension and no mass. There is no tenderness. There is no rebound and no guarding.  Musculoskeletal: Normal range of motion.  Neurological: He is alert and oriented to person, place, and time.  Skin: Skin is warm, dry and intact. No rash noted. He is not diaphoretic. No erythema. No pallor.  Psychiatric: He has a normal mood and affect. His speech is normal and behavior is normal. Judgment and thought content normal. Cognition and memory are normal.  Nursing note and vitals reviewed.   Results for orders placed or performed in visit on [id number]  Basic metabolic panel  Result Value Ref Range   Glucose 81 65 - 99 mg/dL   BUN 24 8 - 27 mg/dL   Creatinine, Ser 1.18 0.76 -  1.27 mg/dL   GFR calc non Af Amer 61 >59 mL/min/1.73   GFR calc Af Amer 71 >59 mL/min/1.73   BUN/Creatinine Ratio 20 10 - 22   Sodium 141 134 - 144 mmol/L   Potassium 5.0 3.5 - 5.2 mmol/L   Chloride 101 96 - 106 mmol/L   CO2 26 18 - 29 mmol/L   Calcium 9.0 8.6 - 10.2 mg/dL  Reticulocytes  Result Value Ref Range   Retic Ct Pct 0.5 (L) 0.6 - 2.6 %      Assessment & Plan:   Problem List Items  Addressed This Visit    None    Visit Diagnoses    Constipation, unspecified constipation type    -  Primary    Improved with OTC miralax. Keep appointment with GI next week. Call with any concerns. Increase water intake.         Follow up plan: Return if symptoms worsen or fail to improve.

## 2015-10-10 DIAGNOSIS — R194 Change in bowel habit: Secondary | ICD-10-CM | POA: Diagnosis not present

## 2015-10-12 DIAGNOSIS — R194 Change in bowel habit: Secondary | ICD-10-CM | POA: Diagnosis not present

## 2015-10-24 ENCOUNTER — Encounter: Payer: Self-pay | Admitting: Family Medicine

## 2015-10-24 ENCOUNTER — Ambulatory Visit (INDEPENDENT_AMBULATORY_CARE_PROVIDER_SITE_OTHER): Payer: 59 | Admitting: Family Medicine

## 2015-10-24 VITALS — BP 173/81 | HR 51 | Temp 97.3°F | Wt 135.0 lb

## 2015-10-24 DIAGNOSIS — R42 Dizziness and giddiness: Secondary | ICD-10-CM | POA: Diagnosis not present

## 2015-10-24 MED ORDER — ONDANSETRON 4 MG PO TBDP: 4.0000 mg | ORAL_TABLET | Freq: Three times a day (TID) | ORAL | 0 refills | Status: DC | PRN

## 2015-10-24 MED ORDER — MECLIZINE HCL 25 MG PO TABS: 25.0000 mg | ORAL_TABLET | Freq: Three times a day (TID) | ORAL | 0 refills | Status: DC | PRN

## 2015-10-24 NOTE — Progress Notes (Signed)
   BP (!) 173/81   Pulse (!) 51   Temp 97.3 F (36.3 C)   Wt 135 lb (61.2 kg)   SpO2 100%   BMI 19.71 kg/m    Subjective:    Patient ID: Scott Guzman, male    DOB: 04-27-1940, 75 y.o.   MRN: [id number]  HPI: MARGARITO BONIFAY is a 75 y.o. male  Chief Complaint  Patient presents with  . Dizziness    woke up this morning feeling like the room is spinning. Having nausea but has not vomited.He states it has gotten better but he still doesn't feel good.   Patient presents with dizziness and nausea that began this morning. States he feels like the room is spinning in circles around him. Worse with laying flat. Does admit to not drinking as much water as he should, but does not feel dehydrated. Denies fever, CP, palpitations, vomiting, diarrhea, urinary symptoms, or abdominal pain. Has not tried anything for relief.   Relevant past medical, surgical, family and social history reviewed and updated as indicated. Interim medical history since our last visit reviewed. Allergies and medications reviewed and updated.  Review of Systems  Constitutional: Negative for chills and fever.  HENT: Negative.   Respiratory: Negative.   Cardiovascular: Negative.   Gastrointestinal: Positive for nausea.  Musculoskeletal: Negative.   Neurological: Positive for dizziness. Negative for syncope.  Psychiatric/Behavioral: Negative.     Per HPI unless specifically indicated above     Objective:    BP (!) 173/81   Pulse (!) 51   Temp 97.3 F (36.3 C)   Wt 135 lb (61.2 kg)   SpO2 100%   BMI 19.71 kg/m   Wt Readings from Last 3 Encounters:  10/24/15 135 lb (61.2 kg)  10/02/15 137 lb (62.1 kg)  04/25/15 141 lb (64 kg)    Physical Exam  Constitutional: He is oriented to person, place, and time. He appears well-developed and well-nourished. No distress.  HENT:  Head: Atraumatic.  Eyes: Conjunctivae are normal. No scleral icterus.  Neck: Normal range of motion. Neck supple.  Cardiovascular: Normal  heart sounds.   Pulmonary/Chest: Effort normal and breath sounds normal.  Abdominal: Soft. Bowel sounds are normal.  Musculoskeletal: Normal range of motion.  Lymphadenopathy:    He has no cervical adenopathy.  Neurological: He is alert and oriented to person, place, and time.  Skin: Skin is warm and dry.  Psychiatric: He has a normal mood and affect. His behavior is normal.  Nursing note and vitals reviewed.     Assessment & Plan:   Problem List Items Addressed This Visit    None    Visit Diagnoses    Dizziness    -  Primary    Likely with components of poor hydration and vertigo. Borderline orthostatics today. Meclizine and zofran sent, and patient encouraged to better hydrate, especially during the summer weather. He knows to go to the ER for worsening symptoms.    Follow up plan: Return if symptoms worsen or fail to improve.

## 2015-10-24 NOTE — Patient Instructions (Signed)
Stay well hydrated  Follow up as needed

## 2015-10-25 ENCOUNTER — Ambulatory Visit: Payer: 59 | Admitting: Family Medicine

## 2015-10-27 ENCOUNTER — Emergency Department (HOSPITAL_COMMUNITY): Payer: Medicare Other

## 2015-10-27 ENCOUNTER — Encounter (HOSPITAL_COMMUNITY): Payer: Self-pay | Admitting: Emergency Medicine

## 2015-10-27 ENCOUNTER — Emergency Department (HOSPITAL_COMMUNITY)
Admission: EM | Admit: 2015-10-27 | Discharge: 2015-10-27 | Disposition: A | Discharge status: Home / Self Care | Payer: Medicare Other | Attending: Emergency Medicine | Admitting: Emergency Medicine

## 2015-10-27 DIAGNOSIS — R002 Palpitations: Secondary | ICD-10-CM | POA: Insufficient documentation

## 2015-10-27 DIAGNOSIS — Z85038 Personal history of other malignant neoplasm of large intestine: Secondary | ICD-10-CM | POA: Insufficient documentation

## 2015-10-27 DIAGNOSIS — I493 Ventricular premature depolarization: Secondary | ICD-10-CM | POA: Diagnosis not present

## 2015-10-27 DIAGNOSIS — Z79899 Other long term (current) drug therapy: Secondary | ICD-10-CM | POA: Diagnosis not present

## 2015-10-27 DIAGNOSIS — Z7982 Long term (current) use of aspirin: Secondary | ICD-10-CM | POA: Diagnosis not present

## 2015-10-27 DIAGNOSIS — R079 Chest pain, unspecified: Secondary | ICD-10-CM | POA: Diagnosis not present

## 2015-10-27 LAB — BASIC METABOLIC PANEL
Anion gap: 10 (ref 5–15)
BUN: 25 mg/dL — AB (ref 6–20)
CHLORIDE: 103 mmol/L (ref 101–111)
CO2: 26 mmol/L (ref 22–32)
CREATININE: 1.22 mg/dL (ref 0.61–1.24)
Calcium: 9.3 mg/dL (ref 8.9–10.3)
GFR calc Af Amer: >60 mL/min (ref >60)
GFR calc non Af Amer: 57 mL/min — ABNORMAL LOW (ref >60)
GLUCOSE: 111 mg/dL — AB (ref 65–99)
Potassium: 4 mmol/L (ref 3.5–5.1)
SODIUM: 139 mmol/L (ref 135–145)

## 2015-10-27 LAB — CBC
HCT: 42.5 % (ref 39.0–52.0)
Hemoglobin: 14.3 g/dL (ref 13.0–17.0)
MCH: 29.7 pg (ref 26.0–34.0)
MCHC: 33.6 g/dL (ref 30.0–36.0)
MCV: 88.4 fL (ref 78.0–100.0)
PLATELETS: 230 10*3/uL (ref 150–400)
RBC: 4.81 MIL/uL (ref 4.22–5.81)
RDW: 13.6 % (ref 11.5–15.5)
WBC: 6 10*3/uL (ref 4.0–10.5)

## 2015-10-27 LAB — I-STAT TROPONIN, ED: Troponin i, poc: 0 ng/mL (ref 0.00–0.08)

## 2015-10-27 LAB — TSH: TSH: 4.033 u[IU]/mL (ref 0.350–4.500)

## 2015-10-27 LAB — MAGNESIUM: Magnesium: 2 mg/dL (ref 1.7–2.4)

## 2015-10-27 MED ORDER — MAGNESIUM SULFATE 2 GM/50ML IV SOLN
2.0000 g | Freq: Once | INTRAVENOUS | Status: AC
  Administered 2015-10-27: 2 g via INTRAVENOUS
  Filled 2015-10-27: qty 50

## 2015-10-27 NOTE — ED Notes (Signed)
Patient transported to X-ray 

## 2015-10-27 NOTE — ED Notes (Signed)
Patient ambulated in hallway with no difficulties.

## 2015-10-27 NOTE — Discharge Instructions (Signed)
Take magnesium supplements once a day until seen by your PCP.

## 2015-10-27 NOTE — ED Triage Notes (Signed)
Patient reports chest pain starting about 1/2 hour ago. Denies shortness of breath, vomiting. Reports nausea and lightheadedness. Patient states he was driving over here today because his wife is having bone marrow test today.

## 2015-10-27 NOTE — ED Provider Notes (Signed)
[facility] DEPT Provider Note   CSN: [id number] Arrival date & time: 10/27/15  [id number]  First Provider Contact:  First MD Initiated Contact with Patient 10/27/15 [phone]        History   Chief Complaint Chief Complaint  Patient presents with  . Chest Pain    HPI Scott Guzman is a 75 y.o. male.  75 yo M with a chief complaint of palpitations and a feeling like his heart is racing. This started while he was on the way to the hospital. Had a little lightheaded with this as well. Patient denied any overt pain denied shortness breath denies diaphoresis nausea or vomiting. He denies lower extremity edema. Has remote history of cancer but has been clean for at least 20 years. Denies cough congestion fevers or chills.   The history is provided by the patient.  Chest Pain  Description: palpitations. Severity:  Moderate Onset quality:  Sudden Duration:  1 hour Timing:  Constant Progression:  Unchanged Chronicity:  New Relieved by:  Nothing Worsened by:  Nothing Ineffective treatments:  None tried Associated symptoms: palpitations   Associated symptoms: no chest pain, no diarrhea, no headaches, no shortness of breath and no vomiting     Past Medical History:  Diagnosis Date  . Cancer ([place])    colon  . Hyperlipidemia     Patient Active Problem List   Diagnosis Date Noted  . Numbness and tingling of right face 04/25/2015  . Insomnia 10/31/2014  . Hypercholesterolemia 10/13/2014  . Colon cancer ([place]) 10/13/2014    Past Surgical History:  Procedure Laterality Date  . COLON SURGERY         Home Medications    Prior to Admission medications   Medication Sig Start Date End Date Taking? Authorizing Provider  aspirin 325 MG EC tablet Take 325 mg by mouth daily.    Historical Provider, MD  meclizine (ANTIVERT) 25 MG tablet Take 1 tablet (25 mg total) by mouth 3 (three) times daily as needed for dizziness. 10/24/15   Volney American, PA-C  meloxicam (MOBIC) 15 MG tablet  Take 1 tablet (15 mg total) by mouth daily as needed for pain. 11/10/14   Guadalupe Maple, MD  mirtazapine (REMERON) 30 MG tablet Take 1 tablet by mouth at  bedtime 06/27/15   Guadalupe Maple, MD  Multiple Vitamin (MULTIVITAMIN) tablet Take 1 tablet by mouth daily.    Historical Provider, MD  ondansetron (ZOFRAN ODT) 4 MG disintegrating tablet Take 1 tablet (4 mg total) by mouth every 8 (eight) hours as needed for nausea or vomiting. 10/24/15   Volney American, PA-C  polyethylene glycol [facility] Floria Raveling) packet Take 17 g by mouth daily as needed.     Historical Provider, MD    Family History No family history on file.  Social History Social History  Substance Use Topics  . Smoking status: Never Smoker  . Smokeless tobacco: Never Used  . Alcohol use No     Allergies   Review of patient's allergies indicates no known allergies.   Review of Systems Review of Systems  Constitutional: Negative for chills and fever.  HENT: Negative for congestion and facial swelling.   Eyes: Negative for discharge and visual disturbance.  Respiratory: Negative for shortness of breath.   Cardiovascular: Positive for palpitations. Negative for chest pain.  Gastrointestinal: Negative for abdominal pain, diarrhea and vomiting.  Musculoskeletal: Negative for arthralgias and myalgias.  Skin: Negative for color change and rash.  Neurological: Negative for  tremors, syncope and headaches.  Psychiatric/Behavioral: Negative for confusion and dysphoric mood.     Physical Exam Updated Vital Signs BP 166/71   Pulse 69   Temp 97.9 F (36.6 C) (Oral)   Resp 22   Ht 5\' 10"  (1.778 m)   Wt 140 lb (63.5 kg)   SpO2 98%   BMI 20.09 kg/m   Physical Exam  Constitutional: He is oriented to person, place, and time. He appears well-developed and well-nourished.  HENT:  Head: Normocephalic and atraumatic.  Eyes: Conjunctivae and EOM are normal. Pupils are equal, round, and reactive to light.  Neck: Normal  range of motion. No JVD present.  Cardiovascular: Normal rate and regular rhythm.   Pulmonary/Chest: Effort normal. No stridor. No respiratory distress.  Abdominal: He exhibits no distension. There is no tenderness. There is no guarding.  Musculoskeletal: Normal range of motion. He exhibits no edema.  Neurological: He is alert and oriented to person, place, and time.  Skin: Skin is warm and dry.  Psychiatric: He has a normal mood and affect. His behavior is normal.     ED Treatments / Results  Labs (all labs ordered are listed, but only abnormal results are displayed) Labs Reviewed  BASIC METABOLIC PANEL - Abnormal; Notable for the following:       Result Value   Glucose, Bld 111 (*)    BUN 25 (*)    GFR calc non Af Amer 57 (*)    All other components within normal limits  CBC  MAGNESIUM  TSH  I-STAT TROPOININ, ED    EKG  EKG Interpretation  Date/Time:  Friday October 27 2015 09:00:37 EDT Ventricular Rate:  97 PR Interval:    QRS Duration: 103 QT Interval:  362 QTC Calculation: 460 R Axis:   95 Text Interpretation:  Sinus rhythm Biatrial enlargement Right axis deviation No significant change since last tracing Confirmed by Hayven Croy MD, Quillian Quince [id number]) on 10/27/2015 9:18:20 AM Also confirmed by [name] Nine MD, DANIEL [phone]), editor [place] [address], [place] [phone])  on 10/27/2015 10:04:49 AM       Radiology Dg Chest 2 View  Result Date: 10/27/2015 CLINICAL DATA:  Left-sided chest pain EXAM: CHEST  2 VIEW COMPARISON:  December 26, 2011 FINDINGS: There is slight scarring in the right mid lung with probable small granulomas in this area. Lungs elsewhere are clear. Heart size and pulmonary vascularity are normal. No adenopathy. There is atherosclerotic calcification in the aorta. No bone lesions. No pneumothorax. IMPRESSION: Slight scarring with granulomas right mid lung, stable. No edema or consolidation. Aortic atherosclerosis. Electronically Signed   By: Lowella Grip III M.D.   On: 10/27/2015  09:50    Procedures Procedures (including critical care time)  Medications Ordered in ED Medications  magnesium sulfate IVPB 2 g 50 mL (2 g Intravenous New Bag/Given 10/27/15 [id number])     Initial Impression / Assessment and Plan / ED Course  I have reviewed the triage vital signs and the nursing notes.  Pertinent labs & imaging results that were available during my care of the patient were reviewed by me and considered in my medical decision making (see chart for details).  Clinical Course    75 yo M With a chief complaint of palpitations. Patient has PVCs on the monitor that coincide with his symptoms. I suspect this is the cause. I will obtain a troponin chest x-ray TSH electrolytes. I will give a dose of magnesium and see if this improves his symptoms.  Complete resolution of symptoms,  able to walk without difficulty.  D/c home.   10:44 AM:  I have discussed the diagnosis/risks/treatment options with the patient and family and believe the pt to be eligible for discharge home to follow-up with PCP. We also discussed returning to the ED immediately if new or worsening sx occur. We discussed the sx which are most concerning (e.g., sudden worsening pain, fever, inability to tolerate by mouth) that necessitate immediate return. Medications administered to the patient during their visit and any new prescriptions provided to the patient are listed below.  Medications given during this visit Medications  magnesium sulfate IVPB 2 g 50 mL (2 g Intravenous New Bag/Given 10/27/15 [id number])     The patient appears reasonably screen and/or stabilized for discharge and I doubt any other medical condition or other [facility] requiring further screening, evaluation, or treatment in the ED at this time prior to discharge.    Final Clinical Impressions(s) / ED Diagnoses   Final diagnoses:  PVC (premature ventricular contraction)  Palpitations    New Prescriptions New Prescriptions   No medications on file       Deno Etienne, DO 10/27/15 1045

## 2015-10-27 NOTE — ED Notes (Signed)
MD at bedside. 

## 2015-11-01 ENCOUNTER — Ambulatory Visit (INDEPENDENT_AMBULATORY_CARE_PROVIDER_SITE_OTHER): Payer: 59 | Admitting: Family Medicine

## 2015-11-01 ENCOUNTER — Encounter: Payer: Self-pay | Admitting: Family Medicine

## 2015-11-01 VITALS — BP 131/75 | HR 64 | Temp 97.5°F | Wt 135.0 lb

## 2015-11-01 DIAGNOSIS — Z23 Encounter for immunization: Secondary | ICD-10-CM

## 2015-11-01 DIAGNOSIS — I493 Ventricular premature depolarization: Secondary | ICD-10-CM | POA: Diagnosis not present

## 2015-11-01 DIAGNOSIS — K5900 Constipation, unspecified: Secondary | ICD-10-CM

## 2015-11-01 DIAGNOSIS — W57XXXA Bitten or stung by nonvenomous insect and other nonvenomous arthropods, initial encounter: Secondary | ICD-10-CM | POA: Diagnosis not present

## 2015-11-01 DIAGNOSIS — T148 Other injury of unspecified body region: Secondary | ICD-10-CM

## 2015-11-01 NOTE — Patient Instructions (Addendum)
Premature Ventricular Contraction A premature ventricular contraction is an irregularity in the normal heart rhythm. These contractions are extra heartbeats that occur too early in the normal sequence. In most cases, these contractions are harmless and do not require treatment. CAUSES Premature ventricular contractions may occur without a known cause. In healthy people, the extra contractions may be caused by:  Smoking.  Drinking alcohol.  Caffeine.  Certain medicines.  Some illegal drugs.  Stress. Sometimes, changes in chemicals in the blood (electrolytes) can also cause premature ventricular contractions. They can also occur in people with heart diseases that cause a decrease in blood flow to the heart. SIGNS AND SYMPTOMS Premature ventricular contractions often do not cause any symptoms. In some cases, you may have a feeling of your heart beating fast or skipping a beat (palpitations). DIAGNOSIS Your health care provider will take your medical history and do a physical exam. During the exam, the health care provider will check for irregular heartbeats. Various tests may be done to help diagnose premature ventricular contractions. These tests may include:  An ECG (electrocardiogram) to monitor the electrical activity of your heart.  Holter monitor testing. A Holter monitor is a portable device that can monitor the electrical activity of your heart over longer periods of time.  Stress tests to see how exercise affects your heart rhythm.  Echocardiogram. This test uses sound waves (ultrasound) to produce an image of your heart.  Electrophysiology study. This is used to evaluate the electrical conduction system of your heart. TREATMENT Usually, no treatment is needed. You may be advised to avoid things that can trigger the premature contractions, such as caffeine or alcohol. Medicines are sometimes given if symptoms are severe or if the extra heartbeats are very frequent. Treatment may  also be needed for an underlying cause of the contractions if one is found. HOME CARE INSTRUCTIONS  Take medicines only as directed by your health care provider.  Make any lifestyle changes recommended by your health care provider. These may include:  Quitting smoking.  Avoiding or limiting caffeine or alcohol.  Exercising. Talk to your health care provider about what type of exercise is safe for you.  Trying to reduce stress.  Keep all follow-up visits with your health care provider. This is important. SEEK IMMEDIATE MEDICAL CARE IF:  You feel palpitations that are frequent or continual.  You have chest pain.  You have shortness of breath.  You have sweating for no reason.  You have nausea and vomiting.  You become light-headed or faint.   This information is not intended to replace advice given to you by your health care provider. Make sure you discuss any questions you have with your health care provider.   Document Released: 10/27/2003 Document Revised: 04/01/2014 Document Reviewed: 08/12/2013 Elsevier Interactive Patient Education 2016 [employer] Information Ticks are insects that attach themselves to the skin and draw blood for food. There are various types of ticks. Common types include wood ticks and deer ticks. Most ticks live in shrubs and grassy areas. Ticks can climb onto your body when you make contact with leaves or grass where the tick is waiting. The most common places on the body for ticks to attach themselves are the scalp, neck, armpits, waist, and groin. Most tick bites are harmless, but sometimes ticks carry germs that cause diseases. These germs can be spread to a person during the tick's feeding process. The chance of a disease spreading through a tick bite depends on:  The type of tick.  Time of year.   How long the tick is attached.   Geographic location.  HOW CAN YOU PREVENT TICK BITES? Take these steps to help prevent tick  bites when you are outdoors:  Wear protective clothing. Long sleeves and long pants are best.   Wear white clothes so you can see ticks more easily.  Tuck your pant legs into your socks.   If walking on a trail, stay in the middle of the trail to avoid brushing against bushes.  Avoid walking through areas with long grass.  Put insect repellent on all exposed skin and along boot tops, pant legs, and sleeve cuffs.   Check clothing, hair, and skin repeatedly and before going inside.   Brush off any ticks that are not attached.  Take a shower or bath as soon as possible after being outdoors.  WHAT IS THE PROPER WAY TO REMOVE A TICK? Ticks should be removed as soon as possible to help prevent diseases caused by tick bites. 1. If latex gloves are available, put them on before trying to remove a tick.  2. Using fine-point tweezers, grasp the tick as close to the skin as possible. You may also use curved forceps or a tick removal tool. Grasp the tick as close to its head as possible. Avoid grasping the tick on its body. 3. Pull gently with steady upward pressure until the tick lets go. Do not twist the tick or jerk it suddenly. This may break off the tick's head or mouth parts. 4. Do not squeeze or crush the tick's body. This could force disease-carrying fluids from the tick into your body.  5. After the tick is removed, wash the bite area and your hands with soap and water or other disinfectant such as alcohol. 6. Apply a small amount of antiseptic cream or ointment to the bite site.  7. Wash and disinfect any instruments that were used.  Do not try to remove a tick by applying a hot match, petroleum jelly, or fingernail polish to the tick. These methods do not work and may increase the chances of disease being spread from the tick bite.  WHEN SHOULD YOU SEEK MEDICAL CARE? Contact your health care provider if you are unable to remove a tick from your skin or if a part of the tick  breaks off and is stuck in the skin.  After a tick bite, you need to be aware of signs and symptoms that could be related to diseases spread by ticks. Contact your health care provider if you develop any of the following in the days or weeks after the tick bite:  Unexplained fever.  Rash. A circular rash that appears days or weeks after the tick bite may indicate the possibility of Lyme disease. The rash may resemble a target with a bull's-eye and may occur at a different part of your body than the tick bite.  Redness and swelling in the area of the tick bite.   Tender, swollen lymph glands.   Diarrhea.   Weight loss.   Cough.   Fatigue.   Muscle, joint, or bone pain.   Abdominal pain.   Headache.   Lethargy or a change in your level of consciousness.  Difficulty walking or moving your legs.   Numbness in the legs.   Paralysis.  Shortness of breath.   Confusion.   Repeated vomiting.    This information is not intended to replace advice given to you by your  health care provider. Make sure you discuss any questions you have with your health care provider.   Document Released: 03/08/2000 Document Revised: 04/01/2014 Document Reviewed: 08/19/2012 Elsevier Interactive Patient Education 2016 [place] (what we'll do if it keeps happening or comes back) A Holter monitor is a small device that is used to detect abnormal heart rhythms. It clips to your clothing and is connected by wires to flat, sticky disks (electrodes) that attach to your chest. It is worn continuously for 24-48 hours. HOME CARE INSTRUCTIONS  Wear your Holter monitor at all times, even while exercising and sleeping, for as long as directed by your health care provider.  Make sure that the Holter monitor is safely clipped to your clothing or close to your body as recommended by your health care provider.  Do not get the monitor or wires wet.  Do not put body lotion  or moisturizer on your chest.  Keep your skin clean.  Keep a diary of your daily activities, such as walking and doing chores. If you feel that your heartbeat is abnormal or that your heart is fluttering or skipping a beat:  Record what you are doing when it happens.  Record what time of day the symptoms occur.  Return your Holter monitor as directed by your health care provider.  Keep all follow-up visits as directed by your health care provider. This is important. SEEK IMMEDIATE MEDICAL CARE IF:  You feel lightheaded or you faint.  You have trouble breathing.  You feel pain in your chest, upper arm, or jaw.  You feel sick to your stomach and your skin is pale, cool, or damp.  You heartbeat feels unusual or abnormal.   This information is not intended to replace advice given to you by your health care provider. Make sure you discuss any questions you have with your health care provider.   Document Released: 12/08/2003 Document Revised: 04/01/2014 Document Reviewed: 10/18/2013 Elsevier Interactive Patient Education [employer].

## 2015-11-01 NOTE — Progress Notes (Signed)
BP 131/75 (BP Location: Left Arm, Patient Position: Sitting, Cuff Size: Normal)   Pulse 64   Temp 97.5 F (36.4 C)   Wt 135 lb (61.2 kg)   SpO2 100%   BMI 19.37 kg/m    Subjective:    Patient ID: Scott Guzman, male    DOB: 22-Oct-1940, 75 y.o.   MRN: [id number]  HPI: Scott Guzman is a 75 y.o. male  Chief Complaint  Patient presents with  . ER Follow Up    PVC's   ER FOLLOW UP- Felt like he almost passed out, so went to the ER and got checked out.  Time since discharge: 5 days Hospital/facility: [facility] Diagnosis: PVCs/Palpitations Procedures/tests: EKG, troponin, TSH, CXR, electrolytes (all normal) Consultants: None New medications: magnesium in ER Discharge instructions:  Follow up here Status: better- hasn't been having any racing of the heart since then.   Constipation is doing better after doing worse. Saw GI and everything looked OK. Has been under a lot of stress recently with his wife's illness, his own issues and stuff going on with his son.  He does note that he has had a few tick bites. He has never been tested.   Relevant past medical, surgical, family and social history reviewed and updated as indicated. Interim medical history since our last visit reviewed. Allergies and medications reviewed and updated.  Review of Systems  Constitutional: Positive for activity change and fatigue. Negative for appetite change, chills, diaphoresis, fever and unexpected weight change.  Gastrointestinal: Positive for constipation. Negative for abdominal distention, abdominal pain, anal bleeding, blood in stool, diarrhea, nausea, rectal pain and vomiting.  Neurological: Positive for dizziness, weakness and light-headedness. Negative for tremors, seizures, syncope, facial asymmetry, speech difficulty, numbness and headaches.  Hematological: Negative.   Psychiatric/Behavioral: Negative for agitation, behavioral problems, confusion, decreased concentration, dysphoric mood,  hallucinations, self-injury, sleep disturbance and suicidal ideas. The patient is nervous/anxious. The patient is not hyperactive.     Per HPI unless specifically indicated above     Objective:    BP 131/75 (BP Location: Left Arm, Patient Position: Sitting, Cuff Size: Normal)   Pulse 64   Temp 97.5 F (36.4 C)   Wt 135 lb (61.2 kg)   SpO2 100%   BMI 19.37 kg/m   Wt Readings from Last 3 Encounters:  11/01/15 135 lb (61.2 kg)  10/27/15 140 lb (63.5 kg)  10/24/15 135 lb (61.2 kg)    Physical Exam  Constitutional: He is oriented to person, place, and time. He appears well-developed and well-nourished. No distress.  HENT:  Head: Normocephalic and atraumatic.  Right Ear: Hearing normal.  Left Ear: Hearing normal.  Nose: Nose normal.  Eyes: Conjunctivae and lids are normal. Right eye exhibits no discharge. Left eye exhibits no discharge. No scleral icterus.  Cardiovascular: Normal rate, regular rhythm, normal heart sounds and intact distal pulses.  Exam reveals no gallop and no friction rub.   No murmur heard. Pulmonary/Chest: Effort normal and breath sounds normal. No respiratory distress. He has no wheezes. He has no rales. He exhibits no tenderness.  Musculoskeletal: Normal range of motion.  Neurological: He is alert and oriented to person, place, and time.  Skin: Skin is warm, dry and intact. No rash noted. He is not diaphoretic. No erythema. No pallor.  Psychiatric: He has a normal mood and affect. His speech is normal and behavior is normal. Judgment and thought content normal. Cognition and memory are normal.  Nursing note and vitals reviewed.  Results for orders placed or performed during the hospital encounter of [id number]  Basic metabolic panel  Result Value Ref Range   Sodium 139 135 - 145 mmol/L   Potassium 4.0 3.5 - 5.1 mmol/L   Chloride 103 101 - 111 mmol/L   CO2 26 22 - 32 mmol/L   Glucose, Bld 111 (H) 65 - 99 mg/dL   BUN 25 (H) 6 - 20 mg/dL   Creatinine, Ser  1.22 0.61 - 1.24 mg/dL   Calcium 9.3 8.9 - 10.3 mg/dL   GFR calc non Af Amer 57 (L) >60 mL/min   GFR calc Af Amer >60 >60 mL/min   Anion gap 10 5 - 15  CBC  Result Value Ref Range   WBC 6.0 4.0 - 10.5 K/uL   RBC 4.81 4.22 - 5.81 MIL/uL   Hemoglobin 14.3 13.0 - 17.0 g/dL   HCT 42.5 39.0 - 52.0 %   MCV 88.4 78.0 - 100.0 fL   MCH 29.7 26.0 - 34.0 pg   MCHC 33.6 30.0 - 36.0 g/dL   RDW 13.6 11.5 - 15.5 %   Platelets 230 150 - 400 K/uL  Magnesium  Result Value Ref Range   Magnesium 2.0 1.7 - 2.4 mg/dL  TSH  Result Value Ref Range   TSH 4.033 0.350 - 4.500 uIU/mL  I-stat troponin, ED  Result Value Ref Range   Troponin i, poc 0.00 0.00 - 0.08 ng/mL   Comment 3              Assessment & Plan:   Problem List Items Addressed This Visit    None    Visit Diagnoses    PVCs (premature ventricular contractions)    -  Primary   EKG normal today. Push fluids. Watch stress. If getting worse, let us know and we'll send to cardiology. Call with any concerns.   Relevant Orders   EKG 12-Lead (Completed)   Tick bite       Just not feeling like himself. Will check for tick bourne diseases. Continue to monitor. Await results.    Relevant Orders   Lyme Ab/Western Blot Reflex   Rocky mtn spotted fvr abs pnl(IgG+IgM)   Babesia microti Antibody Panel   Ehrlichia Antibody Panel   Constipation, unspecified constipation type       Has seen GI. Continue pushing fluids. Continue miralax. Continue to monitor.    Immunization due       Pneumovax given today.       Follow up plan: Return if symptoms worsen or fail to improve.

## 2015-11-03 LAB — EHRLICHIA ANTIBODY PANEL
E. CHAFFEENSIS (HME) IGM TITER: NEGATIVE
E.Chaffeensis (HME) IgG: NEGATIVE
HGE IGG TITER: NEGATIVE
HGE IgM Titer: NEGATIVE

## 2015-11-03 LAB — LYME AB/WESTERN BLOT REFLEX: LYME DISEASE AB, QUANT, IGM: <0.8 index (ref 0.00–0.79)

## 2015-11-03 LAB — BABESIA MICROTI ANTIBODY PANEL
Babesia microti IgG: <1:10 {titer}
Babesia microti IgM: <1:10 {titer}

## 2015-11-03 LAB — ROCKY MTN SPOTTED FVR ABS PNL(IGG+IGM)
RMSF IgG: POSITIVE — AB
RMSF IgM: 0.33 index (ref 0.00–0.89)

## 2015-11-03 LAB — RMSF, IGG, IFA

## 2015-11-06 ENCOUNTER — Telehealth: Payer: Self-pay | Admitting: Family Medicine

## 2015-11-06 NOTE — Telephone Encounter (Signed)
Can you please let him know that his labs came back normal. 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 Thanks!

## 2015-11-07 NOTE — Telephone Encounter (Signed)
Patient notified

## 2015-12-13 ENCOUNTER — Other Ambulatory Visit: Payer: Self-pay | Admitting: Family Medicine

## 2016-01-04 ENCOUNTER — Encounter: Payer: Self-pay | Admitting: Family Medicine

## 2016-01-04 ENCOUNTER — Ambulatory Visit (INDEPENDENT_AMBULATORY_CARE_PROVIDER_SITE_OTHER): Payer: 59 | Admitting: Family Medicine

## 2016-01-04 VITALS — BP 116/69 | HR 82 | Temp 99.0°F | Wt 138.0 lb

## 2016-01-04 DIAGNOSIS — R945 Abnormal results of liver function studies: Secondary | ICD-10-CM | POA: Diagnosis not present

## 2016-01-04 DIAGNOSIS — R509 Fever, unspecified: Secondary | ICD-10-CM | POA: Diagnosis not present

## 2016-01-04 DIAGNOSIS — Z7689 Persons encountering health services in other specified circumstances: Secondary | ICD-10-CM | POA: Diagnosis not present

## 2016-01-04 MED ORDER — DOXYCYCLINE HYCLATE 100 MG PO TABS: 100.0000 mg | ORAL_TABLET | Freq: Two times a day (BID) | ORAL | 0 refills | Status: DC

## 2016-01-04 NOTE — Progress Notes (Signed)
   BP 116/69   Pulse 82   Temp 99 F (37.2 C)   Wt 138 lb (62.6 kg)   SpO2 97%   BMI 19.80 kg/m    Subjective:    Patient ID: Scott Guzman, male    DOB: 04/14/1940, 75 y.o.   MRN: [id number]  HPI: Scott Guzman is a 75 y.o. male  Chief Complaint  Patient presents with  . URI    x 1 week. Body aches, joint pain, some fever, slight cough. No sore throat, head congestion, no ear ache. No known tick bites.    Patient presents with 1 week history of fever, marked fatigue, body aches, joint pain. Temps have been around 101-101.5. Taking aspirin for symptoms. No URI sxs, cough, urinary symptoms, wounds, or rashes. Denies CP, SOB. No known tick bite or exposures. No recent travel or new foods.  Relevant past medical, surgical, family and social history reviewed and updated as indicated. Interim medical history since our last visit reviewed. Allergies and medications reviewed and updated.  Review of Systems  Constitutional: Positive for chills, fatigue and fever.  HENT: Negative.   Respiratory: Negative.   Cardiovascular: Negative.   Gastrointestinal: Negative.   Genitourinary: Negative.   Musculoskeletal: Positive for myalgias.  Skin: Negative.   Neurological: Negative.   Psychiatric/Behavioral: Negative.     Per HPI unless specifically indicated above     Objective:    BP 116/69   Pulse 82   Temp 99 F (37.2 C)   Wt 138 lb (62.6 kg)   SpO2 97%   BMI 19.80 kg/m   Wt Readings from Last 3 Encounters:  01/04/16 138 lb (62.6 kg)  11/01/15 135 lb (61.2 kg)  10/27/15 140 lb (63.5 kg)    Physical Exam  Constitutional: He is oriented to person, place, and time. He appears well-developed and well-nourished. No distress.  HENT:  Head: Atraumatic.  Right Ear: External ear normal.  Left Ear: External ear normal.  Nose: Nose normal.  Mouth/Throat: Oropharynx is clear and moist. No oropharyngeal exudate.  Eyes: Conjunctivae are normal. No scleral icterus.  Neck: Normal range  of motion. Neck supple.  Cardiovascular: Normal rate and normal heart sounds.   Pulmonary/Chest: Effort normal. No respiratory distress.  Abdominal: Soft. Bowel sounds are normal.  Musculoskeletal: Normal range of motion.  Lymphadenopathy:    He has no cervical adenopathy.  Neurological: He is alert and oriented to person, place, and time.  Skin: Skin is warm and dry.  Psychiatric: He has a normal mood and affect. His behavior is normal.  Nursing note and vitals reviewed.     Assessment & Plan:   Problem List Items Addressed This Visit    None    Visit Diagnoses    Fever, unspecified fever cause    -  Primary   Will prophylax with 1 week of doxycycline while awaiting labs. Continue tylenol or ibuprofen for symptomatic control in the meantime   Relevant Orders   CBC with Differential/Platelet (Completed)   Comprehensive metabolic panel (Completed)   CMV IgM (Completed)   Rocky mtn spotted fvr ab, IgM-blood (Completed)   Lyme Ab/Western Blot Reflex (Completed)   Monospot (Completed)   Sed Rate (ESR) (Completed)       Follow up plan: Return if symptoms worsen or fail to improve.

## 2016-01-05 ENCOUNTER — Other Ambulatory Visit: Payer: Self-pay | Admitting: Family Medicine

## 2016-01-05 ENCOUNTER — Telehealth: Payer: Self-pay | Admitting: Family Medicine

## 2016-01-05 DIAGNOSIS — R748 Abnormal levels of other serum enzymes: Secondary | ICD-10-CM

## 2016-01-05 NOTE — Telephone Encounter (Signed)
Patient notified

## 2016-01-05 NOTE — Patient Instructions (Signed)
Follow up as needed depending on lab results.

## 2016-01-05 NOTE — Telephone Encounter (Signed)
Please call pt and let him know that I am adding a hepatitis panel to his labs from yesterday as his liver function tests came back elevated. Still haven't gotten all of the other labs back yet. Hoping to get the rest back Monday.

## 2016-01-06 LAB — LYME AB/WESTERN BLOT REFLEX: LYME DISEASE AB, QUANT, IGM: <0.8 index (ref 0.00–0.79)

## 2016-01-06 LAB — COMPREHENSIVE METABOLIC PANEL
A/G RATIO: 1.4 (ref 1.2–2.2)
ALT: 98 IU/L — AB (ref 0–44)
AST: 99 IU/L — AB (ref 0–40)
Albumin: 3.8 g/dL (ref 3.5–4.8)
Alkaline Phosphatase: 165 IU/L — ABNORMAL HIGH (ref 39–117)
BUN/Creatinine Ratio: 16 (ref 10–24)
BUN: 18 mg/dL (ref 8–27)
Bilirubin Total: 0.5 mg/dL (ref 0.0–1.2)
CALCIUM: 9.2 mg/dL (ref 8.6–10.2)
CHLORIDE: 96 mmol/L (ref 96–106)
CO2: 28 mmol/L (ref 18–29)
Creatinine, Ser: 1.12 mg/dL (ref 0.76–1.27)
GFR calc Af Amer: 75 mL/min/{1.73_m2} (ref >59)
GFR, EST NON AFRICAN AMERICAN: 65 mL/min/{1.73_m2} (ref >59)
Globulin, Total: 2.8 g/dL (ref 1.5–4.5)
Glucose: 111 mg/dL — ABNORMAL HIGH (ref 65–99)
POTASSIUM: 5 mmol/L (ref 3.5–5.2)
Sodium: 140 mmol/L (ref 134–144)
Total Protein: 6.6 g/dL (ref 6.0–8.5)

## 2016-01-06 LAB — CBC WITH DIFFERENTIAL/PLATELET
BASOS ABS: 0 10*3/uL (ref 0.0–0.2)
BASOS: 0 %
EOS (ABSOLUTE): 0.1 10*3/uL (ref 0.0–0.4)
Eos: 1 %
Hematocrit: 36.7 % — ABNORMAL LOW (ref 37.5–51.0)
Hemoglobin: 12.2 g/dL — ABNORMAL LOW (ref 12.6–17.7)
IMMATURE GRANULOCYTES: 0 %
Immature Grans (Abs): 0 10*3/uL (ref 0.0–0.1)
Lymphocytes Absolute: 0.9 10*3/uL (ref 0.7–3.1)
Lymphs: 8 %
MCH: 30 pg (ref 26.6–33.0)
MCHC: 33.2 g/dL (ref 31.5–35.7)
MCV: 90 fL (ref 79–97)
MONOS ABS: 0.9 10*3/uL (ref 0.1–0.9)
Monocytes: 8 %
NEUTROS PCT: 83 %
Neutrophils Absolute: 8.8 10*3/uL — ABNORMAL HIGH (ref 1.4–7.0)
Platelets: 327 10*3/uL (ref 150–379)
RBC: 4.06 x10E6/uL — AB (ref 4.14–5.80)
RDW: 13.8 % (ref 12.3–15.4)
WBC: 10.7 10*3/uL (ref 3.4–10.8)

## 2016-01-06 LAB — CMV IGM: CMV IgM Ser EIA-aCnc: <30 AU/mL (ref 0.0–29.9)

## 2016-01-06 LAB — MONONUCLEOSIS SCREEN: Mono Screen: NEGATIVE

## 2016-01-06 LAB — ROCKY MTN SPOTTED FVR AB, IGM-BLOOD: RMSF IGM: 0.22 {index} (ref 0.00–0.89)

## 2016-01-06 LAB — SEDIMENTATION RATE: SED RATE: 16 mm/h (ref 0–30)

## 2016-01-08 ENCOUNTER — Ambulatory Visit
Admission: RE | Admit: 2016-01-08 | Discharge: 2016-01-08 | Disposition: A | Discharge status: Home / Self Care | Payer: Medicare Other | Source: Ambulatory Visit | Attending: Family Medicine | Admitting: Family Medicine

## 2016-01-08 ENCOUNTER — Encounter: Payer: Self-pay | Admitting: Family Medicine

## 2016-01-08 ENCOUNTER — Ambulatory Visit (INDEPENDENT_AMBULATORY_CARE_PROVIDER_SITE_OTHER): Payer: 59 | Admitting: Family Medicine

## 2016-01-08 VITALS — BP 138/73 | HR 84 | Temp 98.2°F | Wt 137.0 lb

## 2016-01-08 DIAGNOSIS — R05 Cough: Secondary | ICD-10-CM | POA: Diagnosis not present

## 2016-01-08 DIAGNOSIS — R509 Fever, unspecified: Secondary | ICD-10-CM | POA: Insufficient documentation

## 2016-01-08 DIAGNOSIS — R918 Other nonspecific abnormal finding of lung field: Secondary | ICD-10-CM | POA: Diagnosis not present

## 2016-01-08 LAB — CBC WITH DIFFERENTIAL/PLATELET
HEMOGLOBIN: 11.8 g/dL — AB (ref 12.6–17.7)
Hematocrit: 36 % — ABNORMAL LOW (ref 37.5–51.0)
LYMPHS ABS: 0.9 10*3/uL (ref 0.7–3.1)
Lymphs: 8 %
MCH: 30.5 pg (ref 26.6–33.0)
MCHC: 32.8 g/dL (ref 31.5–35.7)
MCV: 93 fL (ref 79–97)
MID (Absolute): 1.3 10*3/uL (ref 0.1–1.6)
MID: 11 %
NEUTROS ABS: 9.5 10*3/uL — AB (ref 1.4–7.0)
NEUTROS PCT: 81 %
PLATELETS: 404 10*3/uL — AB (ref 150–379)
RBC: 3.87 x10E6/uL — ABNORMAL LOW (ref 4.14–5.80)
RDW: 13.1 % (ref 12.3–15.4)
WBC: 11.7 10*3/uL — AB (ref 3.4–10.8)

## 2016-01-08 LAB — HEPATITIS PANEL, ACUTE
Hep A IgM: NEGATIVE
Hep B C IgM: NEGATIVE
Hep C Virus Ab: <0.1 s/co ratio (ref 0.0–0.9)
Hepatitis B Surface Ag: NEGATIVE

## 2016-01-08 LAB — SPECIMEN STATUS REPORT

## 2016-01-08 NOTE — Patient Instructions (Signed)
Follow up depending on results

## 2016-01-08 NOTE — Progress Notes (Signed)
BP 138/73   Pulse 84   Temp 98.2 F (36.8 C)   Wt 137 lb (62.1 kg)   SpO2 99%   BMI 19.66 kg/m    Subjective:    Patient ID: Scott Guzman, male    DOB: 10-10-1940, 75 y.o.   MRN: [id number]  HPI: Scott Guzman is a 75 y.o. male  Chief Complaint  Patient presents with  . Fever    patient still running fever, not feeling any better or noticed any change with the Doxy.   Patient presents for follow up from visit last week with persistent fevers 100-101, malaise, and generalized aches. States he doesn't feel any better this week, sxs remain the same. Does admit that he's also had a nagging cough for the past month or so that he forgot to mention last time. Has been taking tylenol as needed for sxs. Last dose of tylenol was 7 am this morning. Work-up has been negative for causation to this point, with the only finding being elevated liver enzymes and alk phos. Hepatitis panel negative.   Relevant past medical, surgical, family and social history reviewed and updated as indicated. Interim medical history since our last visit reviewed. Allergies and medications reviewed and updated.  Review of Systems  Constitutional: Positive for fatigue and fever.  HENT: Negative.   Eyes: Negative.   Respiratory: Positive for cough.   Cardiovascular: Negative.   Gastrointestinal: Negative.   Genitourinary: Negative.   Musculoskeletal: Positive for myalgias.  Skin: Negative.   Neurological: Negative.   Psychiatric/Behavioral: Negative.     Per HPI unless specifically indicated above     Objective:    BP 138/73   Pulse 84   Temp 98.2 F (36.8 C)   Wt 137 lb (62.1 kg)   SpO2 99%   BMI 19.66 kg/m   Wt Readings from Last 3 Encounters:  01/08/16 137 lb (62.1 kg)  01/04/16 138 lb (62.6 kg)  11/01/15 135 lb (61.2 kg)    Physical Exam  Constitutional: He is oriented to person, place, and time. He appears well-developed and well-nourished. No distress.  HENT:  Head: Atraumatic.  Eyes:  Conjunctivae are normal. Pupils are equal, round, and reactive to light. No scleral icterus.  Neck: Normal range of motion. Neck supple.  Cardiovascular: Normal rate.   Pulmonary/Chest: Effort normal and breath sounds normal. No respiratory distress.  Musculoskeletal: Normal range of motion.  Lymphadenopathy:    He has no cervical adenopathy.  Neurological: He is alert and oriented to person, place, and time.  Skin: Skin is warm and dry.  Psychiatric: He has a normal mood and affect. His behavior is normal.  Nursing note and vitals reviewed.   Results for orders placed or performed in visit on 01/04/16  CBC with Differential/Platelet  Result Value Ref Range   WBC 10.7 3.4 - 10.8 x10E3/uL   RBC 4.06 (L) 4.14 - 5.80 x10E6/uL   Hemoglobin 12.2 (L) 12.6 - 17.7 g/dL   Hematocrit 36.7 (L) 37.5 - 51.0 %   MCV 90 79 - 97 fL   MCH 30.0 26.6 - 33.0 pg   MCHC 33.2 31.5 - 35.7 g/dL   RDW 13.8 12.3 - 15.4 %   Platelets 327 150 - 379 x10E3/uL   Neutrophils 83 Not Estab. %   Lymphs 8 Not Estab. %   Monocytes 8 Not Estab. %   Eos 1 Not Estab. %   Basos 0 Not Estab. %   Neutrophils Absolute 8.8 (H) 1.4 -  7.0 x10E3/uL   Lymphocytes Absolute 0.9 0.7 - 3.1 x10E3/uL   Monocytes Absolute 0.9 0.1 - 0.9 x10E3/uL   EOS (ABSOLUTE) 0.1 0.0 - 0.4 x10E3/uL   Basophils Absolute 0.0 0.0 - 0.2 x10E3/uL   Immature Granulocytes 0 Not Estab. %   Immature Grans (Abs) 0.0 0.0 - 0.1 x10E3/uL  Comprehensive metabolic panel  Result Value Ref Range   Glucose 111 (H) 65 - 99 mg/dL   BUN 18 8 - 27 mg/dL   Creatinine, Ser 1.12 0.76 - 1.27 mg/dL   GFR calc non Af Amer 65 >59 mL/min/1.73   GFR calc Af Amer 75 >59 mL/min/1.73   BUN/Creatinine Ratio 16 10 - 24   Sodium 140 134 - 144 mmol/L   Potassium 5.0 3.5 - 5.2 mmol/L   Chloride 96 96 - 106 mmol/L   CO2 28 18 - 29 mmol/L   Calcium 9.2 8.6 - 10.2 mg/dL   Total Protein 6.6 6.0 - 8.5 g/dL   Albumin 3.8 3.5 - 4.8 g/dL   Globulin, Total 2.8 1.5 - 4.5 g/dL    Albumin/Globulin Ratio 1.4 1.2 - 2.2   Bilirubin Total 0.5 0.0 - 1.2 mg/dL   Alkaline Phosphatase 165 (H) 39 - 117 IU/L   AST 99 (H) 0 - 40 IU/L   ALT 98 (H) 0 - 44 IU/L  CMV IgM  Result Value Ref Range   CMV IgM Ser EIA-aCnc <30.0 0.0 - 29.9 AU/mL  Rocky mtn spotted fvr ab, IgM-blood  Result Value Ref Range   RMSF IgM 0.22 0.00 - 0.89 index  Lyme Ab/Western Blot Reflex  Result Value Ref Range   Lyme IgG/IgM Ab <0.91 0.00 - 0.90 ISR   LYME DISEASE AB, QUANT, IGM <0.80 0.00 - 0.79 index  Monospot  Result Value Ref Range   Mono Screen Negative Negative  Sed Rate (ESR)  Result Value Ref Range   Sed Rate 16 0 - 30 mm/hr  Hepatitis panel, acute  Result Value Ref Range   Hep A IgM WILL FOLLOW    Hepatitis B Surface Ag WILL FOLLOW    Hep B C IgM WILL FOLLOW    Hep C Virus Ab WILL FOLLOW   Specimen status report  Result Value Ref Range   specimen status report Comment       Assessment & Plan:   Problem List Items Addressed This Visit    None    Visit Diagnoses    Fever, unspecified fever cause    -  Primary   CBC stable from last week, await CXR and repeat CMP. Consider CT abdomen as next step. Ibuprofen for fever control in the meantime   Relevant Orders   CBC With Differential/Platelet   DG Chest 2 View   Comprehensive metabolic panel       Follow up plan: Return if symptoms worsen or fail to improve.

## 2016-01-09 ENCOUNTER — Telehealth: Payer: Self-pay | Admitting: Family Medicine

## 2016-01-09 DIAGNOSIS — R7989 Other specified abnormal findings of blood chemistry: Secondary | ICD-10-CM

## 2016-01-09 DIAGNOSIS — R945 Abnormal results of liver function studies: Principal | ICD-10-CM

## 2016-01-09 LAB — COMPREHENSIVE METABOLIC PANEL
A/G RATIO: 1.2 (ref 1.2–2.2)
ALT: 179 IU/L — AB (ref 0–44)
AST: 100 IU/L — AB (ref 0–40)
Albumin: 3.4 g/dL — ABNORMAL LOW (ref 3.5–4.8)
Alkaline Phosphatase: 268 IU/L — ABNORMAL HIGH (ref 39–117)
BUN/Creatinine Ratio: 24 (ref 10–24)
BUN: 25 mg/dL (ref 8–27)
Bilirubin Total: 0.3 mg/dL (ref 0.0–1.2)
CALCIUM: 8.9 mg/dL (ref 8.6–10.2)
CO2: 29 mmol/L (ref 18–29)
CREATININE: 1.03 mg/dL (ref 0.76–1.27)
Chloride: 99 mmol/L (ref 96–106)
GFR, EST AFRICAN AMERICAN: 83 mL/min/{1.73_m2} (ref >59)
GFR, EST NON AFRICAN AMERICAN: 72 mL/min/{1.73_m2} (ref >59)
Globulin, Total: 2.8 g/dL (ref 1.5–4.5)
Glucose: 119 mg/dL — ABNORMAL HIGH (ref 65–99)
Potassium: 4.5 mmol/L (ref 3.5–5.2)
Sodium: 143 mmol/L (ref 134–144)
TOTAL PROTEIN: 6.2 g/dL (ref 6.0–8.5)

## 2016-01-09 NOTE — Telephone Encounter (Signed)
Patient notified. I advised him of the need for the CT scan as soon as possible, but he states he's very busy with work and wants to go on Friday.

## 2016-01-09 NOTE — Telephone Encounter (Signed)
Patient notified

## 2016-01-09 NOTE — Telephone Encounter (Signed)
Please call pt and let him know that I have ordered a CT scan of his abdomen that I'm putting a high priority on hoping he can get it done today. Have not gotten x-ray back yet, but we need to figure out why his liver enzymes are steadily worsening.

## 2016-01-09 NOTE — Telephone Encounter (Signed)
Just got his chest x-ray back, and it was normal - no new findings. Hoping to get some answers from the CT scan as soon as possible

## 2016-01-11 ENCOUNTER — Telehealth: Payer: Self-pay | Admitting: Family Medicine

## 2016-01-11 ENCOUNTER — Ambulatory Visit
Admission: RE | Admit: 2016-01-11 | Discharge: 2016-01-11 | Disposition: A | Discharge status: Home / Self Care | Payer: Medicare Other | Source: Ambulatory Visit | Attending: Family Medicine | Admitting: Family Medicine

## 2016-01-11 DIAGNOSIS — R945 Abnormal results of liver function studies: Secondary | ICD-10-CM

## 2016-01-11 DIAGNOSIS — N200 Calculus of kidney: Secondary | ICD-10-CM | POA: Diagnosis not present

## 2016-01-11 DIAGNOSIS — R7989 Other specified abnormal findings of blood chemistry: Secondary | ICD-10-CM | POA: Diagnosis not present

## 2016-01-11 DIAGNOSIS — R509 Fever, unspecified: Secondary | ICD-10-CM

## 2016-01-11 HISTORY — DX: Malignant neoplasm of colon, unspecified: C18.9

## 2016-01-11 MED ORDER — IOPAMIDOL (ISOVUE-300) INJECTION 61%
85.0000 mL | Freq: Once | INTRAVENOUS | Status: AC | PRN
  Administered 2016-01-11: 85 mL via INTRAVENOUS

## 2016-01-11 NOTE — Telephone Encounter (Signed)
CT was normal except for a few non-obstructing kidney stones which aren't seeming to cause any symptoms and would not explain all of the things going on. I have placed a referral to infectious disease for further evaluation as we have been unable to identify cause.

## 2016-01-12 NOTE — Telephone Encounter (Signed)
Patient notified

## 2016-01-16 ENCOUNTER — Other Ambulatory Visit: Payer: Self-pay | Admitting: Infectious Diseases

## 2016-01-16 DIAGNOSIS — R7989 Other specified abnormal findings of blood chemistry: Secondary | ICD-10-CM | POA: Diagnosis not present

## 2016-01-16 DIAGNOSIS — R509 Fever, unspecified: Secondary | ICD-10-CM

## 2016-01-16 DIAGNOSIS — R05 Cough: Secondary | ICD-10-CM | POA: Diagnosis not present

## 2016-01-16 DIAGNOSIS — R059 Cough, unspecified: Secondary | ICD-10-CM

## 2016-01-16 DIAGNOSIS — R5383 Other fatigue: Secondary | ICD-10-CM | POA: Diagnosis not present

## 2016-01-22 ENCOUNTER — Emergency Department
Admission: EM | Admit: 2016-01-22 | Discharge: 2016-01-22 | Disposition: A | Discharge status: Home / Self Care | Payer: Medicare Other | Attending: Emergency Medicine | Admitting: Emergency Medicine

## 2016-01-22 ENCOUNTER — Emergency Department: Payer: Medicare Other

## 2016-01-22 DIAGNOSIS — Z85038 Personal history of other malignant neoplasm of large intestine: Secondary | ICD-10-CM | POA: Diagnosis not present

## 2016-01-22 DIAGNOSIS — R509 Fever, unspecified: Secondary | ICD-10-CM | POA: Insufficient documentation

## 2016-01-22 DIAGNOSIS — Z7982 Long term (current) use of aspirin: Secondary | ICD-10-CM | POA: Diagnosis not present

## 2016-01-22 DIAGNOSIS — R05 Cough: Secondary | ICD-10-CM | POA: Insufficient documentation

## 2016-01-22 DIAGNOSIS — Z791 Long term (current) use of non-steroidal anti-inflammatories (NSAID): Secondary | ICD-10-CM | POA: Diagnosis not present

## 2016-01-22 DIAGNOSIS — Z79899 Other long term (current) drug therapy: Secondary | ICD-10-CM | POA: Insufficient documentation

## 2016-01-22 DIAGNOSIS — Z125 Encounter for screening for malignant neoplasm of prostate: Secondary | ICD-10-CM | POA: Diagnosis not present

## 2016-01-22 DIAGNOSIS — J9811 Atelectasis: Secondary | ICD-10-CM | POA: Diagnosis not present

## 2016-01-22 DIAGNOSIS — Z5181 Encounter for therapeutic drug level monitoring: Secondary | ICD-10-CM | POA: Diagnosis not present

## 2016-01-22 DIAGNOSIS — R000 Tachycardia, unspecified: Secondary | ICD-10-CM | POA: Insufficient documentation

## 2016-01-22 DIAGNOSIS — R059 Cough, unspecified: Secondary | ICD-10-CM

## 2016-01-22 DIAGNOSIS — R61 Generalized hyperhidrosis: Secondary | ICD-10-CM | POA: Diagnosis not present

## 2016-01-22 DIAGNOSIS — R053 Chronic cough: Secondary | ICD-10-CM | POA: Insufficient documentation

## 2016-01-22 LAB — BASIC METABOLIC PANEL
Anion gap: 10 (ref 5–15)
BUN: 24 mg/dL — AB (ref 6–20)
CALCIUM: 8.5 mg/dL — AB (ref 8.9–10.3)
CHLORIDE: 100 mmol/L — AB (ref 101–111)
CO2: 25 mmol/L (ref 22–32)
CREATININE: 1.16 mg/dL (ref 0.61–1.24)
GFR calc non Af Amer: >60 mL/min (ref >60)
GLUCOSE: 118 mg/dL — AB (ref 65–99)
Potassium: 4.4 mmol/L (ref 3.5–5.1)
Sodium: 135 mmol/L (ref 135–145)

## 2016-01-22 LAB — CBC WITH DIFFERENTIAL/PLATELET
BASOS PCT: 1 %
Basophils Absolute: 0.1 10*3/uL (ref 0–0.1)
EOS ABS: 0.7 10*3/uL (ref 0–0.7)
EOS PCT: 5 %
HCT: 31 % — ABNORMAL LOW (ref 40.0–52.0)
Hemoglobin: 10.8 g/dL — ABNORMAL LOW (ref 13.0–18.0)
LYMPHS ABS: 0.6 10*3/uL — AB (ref 1.0–3.6)
Lymphocytes Relative: 4 %
MCH: 29.7 pg (ref 26.0–34.0)
MCHC: 34.7 g/dL (ref 32.0–36.0)
MCV: 85.4 fL (ref 80.0–100.0)
MONO ABS: 0.9 10*3/uL (ref 0.2–1.0)
MONOS PCT: 7 %
NEUTROS PCT: 83 %
Neutro Abs: 10.7 10*3/uL — ABNORMAL HIGH (ref 1.4–6.5)
PLATELETS: 470 10*3/uL — AB (ref 150–440)
RBC: 3.63 MIL/uL — ABNORMAL LOW (ref 4.40–5.90)
RDW: 13.7 % (ref 11.5–14.5)
WBC: 13 10*3/uL — ABNORMAL HIGH (ref 3.8–10.6)

## 2016-01-22 LAB — PROTIME-INR
INR: 1.05
Prothrombin Time: 13.7 seconds (ref 11.4–15.2)

## 2016-01-22 LAB — LIPASE, BLOOD: LIPASE: 20 U/L (ref 11–51)

## 2016-01-22 LAB — APTT: aPTT: 40 seconds — ABNORMAL HIGH (ref 24–36)

## 2016-01-22 LAB — SALICYLATE LEVEL: Salicylate Lvl: <7 mg/dL (ref 2.8–30.0)

## 2016-01-22 LAB — PROCALCITONIN: PROCALCITONIN: 0.14 ng/mL

## 2016-01-22 LAB — LACTIC ACID, PLASMA: Lactic Acid, Venous: 1.3 mmol/L (ref 0.5–1.9)

## 2016-01-22 MED ORDER — SODIUM CHLORIDE 0.9 % IV BOLUS (SEPSIS)
1000.0000 mL | Freq: Once | INTRAVENOUS | Status: AC
  Administered 2016-01-22: 1000 mL via INTRAVENOUS

## 2016-01-22 MED ORDER — BENZONATATE 100 MG PO CAPS: 100.0000 mg | ORAL_CAPSULE | Freq: Four times a day (QID) | ORAL | 0 refills | Status: DC | PRN

## 2016-01-22 MED ORDER — IOPAMIDOL (ISOVUE-300) INJECTION 61%
75.0000 mL | Freq: Once | INTRAVENOUS | Status: AC | PRN
  Administered 2016-01-22: 75 mL via INTRAVENOUS

## 2016-01-22 NOTE — ED Triage Notes (Signed)
Pt presents to ED with reoccurring fever and persistent cough for over a month. Seen by pcp multiple times with no known cause for his symptoms. Most recently evaluated earlier this morning with lab work performed. Pt states 6 months ago he was a runner and now he just feels weak and cant do anything. Denies n/v/d. No increased work of breathing or acute distress noted at this time.

## 2016-01-22 NOTE — Discharge Instructions (Signed)
Your labs and CT scan of the chest today were unremarkable. Please continue following up with Dr. Ola Spurr for further evaluation of your  symptoms. Continue drinking lots of fluids and staying well-hydrated.

## 2016-01-22 NOTE — ED Provider Notes (Signed)
[facility] Emergency Department Provider Note  ____________________________________________  Time seen: Approximately 11:31 PM  I have reviewed the triage vital signs and the nursing notes.   HISTORY  Chief Complaint Fever and Cough    HPI Scott Guzman is a 75 y.o. male who complains of fever and cough for the past month. Really states that he's had fatigue and cough for the past 6 months and then a fever over the past 1 month. He's been referred to infectious disease doctor for stroke is been doing a very extensive workup on him. He is negative for various infectious disease titers. He saw his doctor today who added on some additional tests including a Quantiferon gold. He is planned for a CT chest tomorrow. Patient just felt like he needed to be seen tonight. His been eating and drinking normally.  No syncope. No chest pain or shortness of breath. Only takes Remeron which is taken for about 20 years for sleep. Continued subacute night sweats.     Past Medical History:  Diagnosis Date  . Colon cancer ([place]) 2000   He states he had a partial colon resection.   . Hyperlipidemia   . PVC (premature ventricular contraction)      Patient Active Problem List   Diagnosis Date Noted  . Numbness and tingling of right face 04/25/2015  . Insomnia 10/31/2014  . Hypercholesterolemia 10/13/2014  . Colon cancer ([place]) 10/13/2014     Past Surgical History:  Procedure Laterality Date  . COLON SURGERY       Prior to Admission medications   Medication Sig Start Date End Date Taking? Authorizing Provider  aspirin 325 MG EC tablet Take 325 mg by mouth daily.    Historical Provider, MD  doxycycline (VIBRA-TABS) 100 MG tablet Take 1 tablet (100 mg total) by mouth 2 (two) times daily. 01/04/16   Volney American, PA-C  meclizine (ANTIVERT) 25 MG tablet Take 1 tablet (25 mg total) by mouth 3 (three) times daily as needed for dizziness. 10/24/15   Volney American, PA-C  meloxicam [facility]) 15 MG tablet Take 1 tablet by mouth  daily as needed for pain 12/13/15   Guadalupe Maple, MD  mirtazapine (REMERON) 30 MG tablet Take 1 tablet by mouth at  bedtime 06/27/15   Guadalupe Maple, MD  ondansetron (ZOFRAN ODT) 4 MG disintegrating tablet Take 1 tablet (4 mg total) by mouth every 8 (eight) hours as needed for nausea or vomiting. 10/24/15   Volney American, PA-C  polyethylene glycol [facility] Floria Raveling) packet Take 17 g by mouth daily as needed.     Historical Provider, MD     Allergies Review of patient's allergies indicates no known allergies.   No family history on file.  Social History Social History  Substance Use Topics  . Smoking status: Never Smoker  . Smokeless tobacco: Never Used  . Alcohol use No    Review of Systems  Constitutional:   Positive fever and chills. No weight loss ENT:   No sore throat. No rhinorrhea. Cardiovascular:   No chest pain. Respiratory:   No dyspnea positive nonproductive cough. Gastrointestinal:   Negative for abdominal pain, vomiting and diarrhea.  Genitourinary:   Negative for dysuria or difficulty urinating. Musculoskeletal:   Negative for focal pain or swelling Neurological:   Negative for headaches 10-point ROS otherwise negative.  ____________________________________________   PHYSICAL EXAM:  VITAL SIGNS: ED Triage Vitals  Enc Vitals Group     BP  01/22/16 2123 (!) 102/57     Pulse Rate 01/22/16 2123 (!) 102     Resp 01/22/16 2123 20     Temp 01/22/16 2123 (!) 101.8 F (38.8 C)     Temp Source 01/22/16 2123 Oral     SpO2 01/22/16 2123 95 %     Weight 01/22/16 2124 137 lb (62.1 kg)     Height 01/22/16 2124 5\' 10"  (1.778 m)     Head Circumference --      Peak Flow --      Pain Score 01/22/16 2125 2     Pain Loc --      Pain Edu? --      Excl. in [place]? --     Vital signs reviewed, nursing assessments reviewed.   Constitutional:   Alert and oriented. Well appearing and in no  distress. Eyes:   No scleral icterus. No conjunctival pallor. PERRL. EOMI.  No nystagmus. ENT   Head:   Normocephalic and atraumatic.   Nose:   No congestion/rhinnorhea. No septal hematoma   Mouth/Throat:   MMM, no pharyngeal erythema. No peritonsillar mass.    Neck:   No stridor. No SubQ emphysema. No meningismus. Hematological/Lymphatic/Immunilogical:   No cervical lymphadenopathy. Cardiovascular:   Tachycardia heart rate 100. Symmetric bilateral radial and DP pulses.  No murmurs.  Respiratory:   Normal respiratory effort without tachypnea nor retractions. Breath sounds are clear and equal bilaterally. No wheezes/rales/rhonchi. Gastrointestinal:   Soft and nontender. Non distended. There is no CVA tenderness.  No rebound, rigidity, or guarding. Genitourinary:   deferred Musculoskeletal:   Nontender with normal range of motion in all extremities. No joint effusions.  No lower extremity tenderness.  No edema. Neurologic:   Normal speech and language.  CN 2-10 normal. Motor grossly intact. No gross focal neurologic deficits are appreciated.  Skin:    Skin is warm, dry and intact. No rash noted.  No petechiae, purpura, or bullae.  ____________________________________________    LABS (pertinent positives/negatives) (all labs ordered are listed, but only abnormal results are displayed) Labs Reviewed  BASIC METABOLIC PANEL - Abnormal; Notable for the following:       Result Value   Chloride 100 (*)    Glucose, Bld 118 (*)    BUN 24 (*)    Calcium 8.5 (*)    All other components within normal limits  CBC WITH DIFFERENTIAL/PLATELET - Abnormal; Notable for the following:    WBC 13.0 (*)    RBC 3.63 (*)    Hemoglobin 10.8 (*)    HCT 31.0 (*)    Platelets 470 (*)    Neutro Abs 10.7 (*)    Lymphs Abs 0.6 (*)    All other components within normal limits  APTT - Abnormal; Notable for the following:    aPTT 40 (*)    All other components within normal limits  CULTURE, BLOOD  (ROUTINE X 2)  CULTURE, BLOOD (ROUTINE X 2)  URINE CULTURE  CULTURE, EXPECTORATED SPUTUM-ASSESSMENT  LACTIC ACID, PLASMA  PROTIME-INR  LIPASE, BLOOD  PROCALCITONIN  SALICYLATE LEVEL  LACTIC ACID, PLASMA  URINALYSIS COMPLETEWITH MICROSCOPIC ([facility] ONLY)   ____________________________________________   EKG  Interpreted by me Sinus rhythm rate of 85, normal axis intervals QRS ST segments and T waves.  ____________________________________________    RADIOLOGY  Chest x-ray unremarkable CT chest with contrast unremarkable  ____________________________________________   PROCEDURES Procedures  ____________________________________________   INITIAL IMPRESSION / ASSESSMENT AND PLAN / ED COURSE  Pertinent labs & imaging  results that were available during my care of the patient were reviewed by me and considered in my medical decision making (see chart for details).  Patient well appearing no acute distress who presents with fever to 102 and a heart rate of 102. The tachycardia is congruent to the fever. Blood pressure is normal, patient is very calm and comfortable and well-appearing. Sepsis protocol initiated although I actually think the patient is not septic despite the vital sign alterations. No empiric antibiotics. Extensive lab panel here in the ED is unremarkable. I completed the CT chest which was unremarkable except that shows some calcified granulomas, concerning for old granulomatous disease.  Vital signs are now unremarkable. We'll discharge home to follow up with Dr. Ola Spurr closely. As this is been going on for at least a month and he has had a very extensive workup, there doesn't seem to be any benefit to hospitalization since he is hemodynamically stable since essentially all sources of infection of an ruled out except those that can be continued to be worked up and monitored through the clinic.   Clinical Course    ____________________________________________   FINAL CLINICAL IMPRESSION(S) / ED DIAGNOSES  Final diagnoses:  Cough  Fever, unspecified fever cause       Portions of this note were generated with dragon dictation software. Dictation errors may occur despite best attempts at proofreading.    Carrie Mew, MD 01/22/16 [phone]

## 2016-01-22 NOTE — ED Notes (Signed)
Pt discharged to home.  Family member driving.  Discharge instructions reviewed.  Verbalized understanding.  No questions or concerns at this time.  Teach back verified.  Pt in NAD.  No items left in ED.   

## 2016-01-22 NOTE — ED Notes (Signed)
Patient transported to X-ray 

## 2016-01-23 ENCOUNTER — Ambulatory Visit: Admission: RE | Admit: 2016-01-23 | Payer: Medicare Other | Source: Ambulatory Visit

## 2016-01-27 LAB — CULTURE, BLOOD (ROUTINE X 2)
CULTURE: NO GROWTH
CULTURE: NO GROWTH

## 2016-01-29 DIAGNOSIS — R7989 Other specified abnormal findings of blood chemistry: Secondary | ICD-10-CM | POA: Diagnosis not present

## 2016-01-29 DIAGNOSIS — R509 Fever, unspecified: Secondary | ICD-10-CM | POA: Diagnosis not present

## 2016-01-29 DIAGNOSIS — R700 Elevated erythrocyte sedimentation rate: Secondary | ICD-10-CM | POA: Insufficient documentation

## 2016-01-31 DIAGNOSIS — G4489 Other headache syndrome: Secondary | ICD-10-CM | POA: Diagnosis not present

## 2016-02-02 DIAGNOSIS — G4489 Other headache syndrome: Secondary | ICD-10-CM | POA: Diagnosis not present

## 2016-02-12 DIAGNOSIS — R700 Elevated erythrocyte sedimentation rate: Secondary | ICD-10-CM | POA: Diagnosis not present

## 2016-02-12 DIAGNOSIS — R748 Abnormal levels of other serum enzymes: Secondary | ICD-10-CM | POA: Diagnosis not present

## 2016-02-12 DIAGNOSIS — D473 Essential (hemorrhagic) thrombocythemia: Secondary | ICD-10-CM | POA: Diagnosis not present

## 2016-02-21 DIAGNOSIS — D473 Essential (hemorrhagic) thrombocythemia: Secondary | ICD-10-CM | POA: Diagnosis not present

## 2016-02-21 DIAGNOSIS — R700 Elevated erythrocyte sedimentation rate: Secondary | ICD-10-CM | POA: Diagnosis not present

## 2016-02-21 DIAGNOSIS — R748 Abnormal levels of other serum enzymes: Secondary | ICD-10-CM | POA: Diagnosis not present

## 2016-03-06 DIAGNOSIS — M316 Other giant cell arteritis: Secondary | ICD-10-CM | POA: Diagnosis not present

## 2016-03-06 DIAGNOSIS — R700 Elevated erythrocyte sedimentation rate: Secondary | ICD-10-CM | POA: Diagnosis not present

## 2016-03-06 DIAGNOSIS — D473 Essential (hemorrhagic) thrombocythemia: Secondary | ICD-10-CM | POA: Diagnosis not present

## 2016-03-11 DIAGNOSIS — R200 Anesthesia of skin: Secondary | ICD-10-CM | POA: Diagnosis not present

## 2016-03-11 DIAGNOSIS — R700 Elevated erythrocyte sedimentation rate: Secondary | ICD-10-CM | POA: Diagnosis not present

## 2016-03-11 DIAGNOSIS — R202 Paresthesia of skin: Secondary | ICD-10-CM | POA: Diagnosis not present

## 2016-03-11 DIAGNOSIS — D473 Essential (hemorrhagic) thrombocythemia: Secondary | ICD-10-CM | POA: Diagnosis not present

## 2016-03-11 DIAGNOSIS — M316 Other giant cell arteritis: Secondary | ICD-10-CM | POA: Diagnosis not present

## 2016-03-12 ENCOUNTER — Encounter: Payer: Self-pay | Admitting: Family Medicine

## 2016-03-12 ENCOUNTER — Ambulatory Visit (INDEPENDENT_AMBULATORY_CARE_PROVIDER_SITE_OTHER): Payer: Medicare Other | Admitting: Family Medicine

## 2016-03-12 VITALS — BP 146/75 | HR 86 | Temp 98.0°F | Ht 68.7 in | Wt 141.5 lb

## 2016-03-12 DIAGNOSIS — N400 Enlarged prostate without lower urinary tract symptoms: Secondary | ICD-10-CM | POA: Diagnosis not present

## 2016-03-12 DIAGNOSIS — G4700 Insomnia, unspecified: Secondary | ICD-10-CM | POA: Diagnosis not present

## 2016-03-12 DIAGNOSIS — R7989 Other specified abnormal findings of blood chemistry: Secondary | ICD-10-CM | POA: Diagnosis not present

## 2016-03-12 DIAGNOSIS — R945 Abnormal results of liver function studies: Principal | ICD-10-CM

## 2016-03-12 DIAGNOSIS — M316 Other giant cell arteritis: Secondary | ICD-10-CM | POA: Insufficient documentation

## 2016-03-12 NOTE — Progress Notes (Signed)
 BP (!) 146/75 (BP Location: Left Arm, Patient Position: Sitting, Cuff Size: Normal)   Pulse 86   Temp 98 F (36.7 C)   Ht 5' 8.7" (1.745 m)   Wt 141 lb 8 oz (64.2 kg)   SpO2 97%   BMI 21.08 kg/m    Subjective:    Patient ID: Scott Guzman, male    DOB: 11/05/1940, 75 y.o.   MRN: [id number]  HPI: Scott Guzman is a 75 y.o. male  Chief Complaint  Patient presents with  . Medicare Wellness  AWV metrics met Temporal arteritis followed by rheumatology doing much better. Patient still on prednisone at feeling back to normal. Insomnia stable on Remeron. Takes 30 mg at bedtime most nights now that he is on prednisone. Patient very competitive sprint distance runner but due to medical issues hasn't been running this late fall. Reviewed to get back slowly because of possible tendon rupture and prednisone.  Relevant past medical, surgical, family and social history reviewed and updated as indicated. Interim medical history since our last visit reviewed. Allergies and medications reviewed and updated.  Review of Systems  Constitutional: Negative.   HENT: Negative.   Eyes: Negative.   Respiratory: Negative.   Cardiovascular: Negative.   Gastrointestinal: Negative.   Endocrine: Negative.   Genitourinary: Negative.   Musculoskeletal: Negative.   Skin: Negative.   Allergic/Immunologic: Negative.   Neurological: Negative.   Hematological: Negative.   Psychiatric/Behavioral: Negative.     Per HPI unless specifically indicated above     Objective:    BP (!) 146/75 (BP Location: Left Arm, Patient Position: Sitting, Cuff Size: Normal)   Pulse 86   Temp 98 F (36.7 C)   Ht 5' 8.7" (1.745 m)   Wt 141 lb 8 oz (64.2 kg)   SpO2 97%   BMI 21.08 kg/m   Wt Readings from Last 3 Encounters:  03/12/16 141 lb 8 oz (64.2 kg)  01/22/16 137 lb (62.1 kg)  01/08/16 137 lb (62.1 kg)    Physical Exam  Constitutional: He is oriented to person, place, and time. He appears well-developed and  well-nourished.  HENT:  Head: Normocephalic and atraumatic.  Right Ear: External ear normal.  Left Ear: External ear normal.  Eyes: Conjunctivae and EOM are normal. Pupils are equal, round, and reactive to light.  Neck: Normal range of motion. Neck supple.  Cardiovascular: Normal rate, regular rhythm, normal heart sounds and intact distal pulses.   Pulmonary/Chest: Effort normal and breath sounds normal.  Abdominal: Soft. Bowel sounds are normal. There is no splenomegaly or hepatomegaly.  Genitourinary: Rectum normal and penis normal.  Genitourinary Comments: Mild BPH changes  Musculoskeletal: Normal range of motion.  Neurological: He is alert and oriented to person, place, and time. He has normal reflexes.  Skin: No rash noted. No erythema.  Psychiatric: He has a normal mood and affect. His behavior is normal. Judgment and thought content normal.        Assessment & Plan:   Problem List Items Addressed This Visit      Cardiovascular and Mediastinum   Temporal arteritis ([name])    Followed by rheumatology has been on prednisone and feeling better.        Genitourinary   BPH (benign prostatic hyperplasia)   Relevant Orders   PSA     Other   Insomnia    The current medical regimen is effective;  continue present plan and medications.        Other Visit Diagnoses      Elevated LFTs    -  Primary      Only checking PSA as patient's had extensive blood work done throughout the fall. Follow up plan: Return in about 1 year (around 03/12/2017) for Physical Exam.      

## 2016-03-12 NOTE — Assessment & Plan Note (Signed)
Followed by rheumatology has been on prednisone and feeling better.

## 2016-03-12 NOTE — Assessment & Plan Note (Signed)
The current medical regimen is effective;  continue present plan and medications.  

## 2016-03-13 ENCOUNTER — Encounter: Payer: 59 | Admitting: Family Medicine

## 2016-03-13 ENCOUNTER — Encounter: Payer: Self-pay | Admitting: Family Medicine

## 2016-03-13 LAB — PSA: Prostate Specific Ag, Serum: 1 ng/mL (ref 0.0–4.0)

## 2016-03-25 DIAGNOSIS — S22000A Wedge compression fracture of unspecified thoracic vertebra, initial encounter for closed fracture: Secondary | ICD-10-CM

## 2016-03-25 HISTORY — DX: Wedge compression fracture of unspecified thoracic vertebra, initial encounter for closed fracture: S22.000A

## 2016-04-08 ENCOUNTER — Ambulatory Visit (INDEPENDENT_AMBULATORY_CARE_PROVIDER_SITE_OTHER): Payer: Medicare Other | Admitting: Family Medicine

## 2016-04-08 ENCOUNTER — Encounter: Payer: Self-pay | Admitting: Family Medicine

## 2016-04-08 VITALS — BP 105/64 | HR 97 | Temp 97.8°F | Ht 70.0 in | Wt 147.0 lb

## 2016-04-08 DIAGNOSIS — M316 Other giant cell arteritis: Secondary | ICD-10-CM | POA: Diagnosis not present

## 2016-04-08 DIAGNOSIS — I952 Hypotension due to drugs: Secondary | ICD-10-CM | POA: Diagnosis not present

## 2016-04-08 NOTE — Assessment & Plan Note (Signed)
Improving with prednisone decreasing

## 2016-04-08 NOTE — Progress Notes (Signed)
   BP 105/64   Pulse 97   Temp 97.8 F (36.6 C) (Oral)   Ht 5\' 10"  (1.778 m)   Wt 147 lb (66.7 kg)   SpO2 99%   BMI 21.09 kg/m    Subjective:    Patient ID: Scott Guzman, male    DOB: 1940/06/19, 76 y.o.   MRN: [id number]  HPI: Scott Guzman is a 76 y.o. male  Chief Complaint  Patient presents with  . Dizziness  Patient with lightheaded when going from lying or sitting to standing. Patient with no nausea or diaphoresis no real dizziness just more lightheaded has noticed is worse now that is cutting back on prednisone. Of concern these are similar symptoms to what patient had prior to going on prednisone. Patient has gained significant weight on prednisone and has quit his interval training and sprinting.  Relevant past medical, surgical, family and social history reviewed and updated as indicated. Interim medical history since our last visit reviewed. Allergies and medications reviewed and updated.  Review of Systems  Constitutional: Positive for fatigue. Negative for diaphoresis and fever.  Respiratory: Negative.   Cardiovascular: Negative.     Per HPI unless specifically indicated above     Objective:    BP 105/64   Pulse 97   Temp 97.8 F (36.6 C) (Oral)   Ht 5\' 10"  (1.778 m)   Wt 147 lb (66.7 kg)   SpO2 99%   BMI 21.09 kg/m   Wt Readings from Last 3 Encounters:  04/08/16 147 lb (66.7 kg)  03/12/16 141 lb 8 oz (64.2 kg)  01/22/16 137 lb (62.1 kg)    Physical Exam  Constitutional: He is oriented to person, place, and time. He appears well-developed and well-nourished. No distress.  HENT:  Head: Normocephalic and atraumatic.  Right Ear: Hearing normal.  Left Ear: Hearing normal.  Nose: Nose normal.  Eyes: Conjunctivae and lids are normal. Right eye exhibits no discharge. Left eye exhibits no discharge. No scleral icterus.  Cardiovascular: Normal rate, regular rhythm and normal heart sounds.   Pulmonary/Chest: Effort normal and breath sounds normal. No  respiratory distress.  Musculoskeletal: Normal range of motion.  Neurological: He is alert and oriented to person, place, and time.  Skin: Skin is intact. No rash noted.  Psychiatric: He has a normal mood and affect. His speech is normal and behavior is normal. Judgment and thought content normal. Cognition and memory are normal.   patient with marked orthostatic tilt of 132/77 to 105/64 standing Results for orders placed or performed in visit on 03/12/16  PSA  Result Value Ref Range   Prostate Specific Ag, Serum 1.0 0.0 - 4.0 ng/mL      Assessment & Plan:   Problem List Items Addressed This Visit      Cardiovascular and Mediastinum   Temporal arteritis ([place])    Improving with prednisone decreasing       Other Visit Diagnoses    Hypotension due to drugs    -  Primary   Patient tapering prednisone discussed increasing salt and Valsalva maneuvers increase fluids if problems persist will need to further evaluate       Follow up plan: Return for As scheduled.

## 2016-04-17 DIAGNOSIS — R700 Elevated erythrocyte sedimentation rate: Secondary | ICD-10-CM | POA: Diagnosis not present

## 2016-04-17 DIAGNOSIS — Z79899 Other long term (current) drug therapy: Secondary | ICD-10-CM | POA: Diagnosis not present

## 2016-04-18 ENCOUNTER — Other Ambulatory Visit: Payer: Self-pay | Admitting: Family Medicine

## 2016-05-04 ENCOUNTER — Emergency Department: Payer: Medicare Other

## 2016-05-04 ENCOUNTER — Emergency Department
Admission: EM | Admit: 2016-05-04 | Discharge: 2016-05-04 | Disposition: A | Discharge status: Home / Self Care | Payer: Medicare Other | Attending: Emergency Medicine | Admitting: Emergency Medicine

## 2016-05-04 ENCOUNTER — Encounter: Payer: Self-pay | Admitting: Emergency Medicine

## 2016-05-04 DIAGNOSIS — R1010 Upper abdominal pain, unspecified: Secondary | ICD-10-CM | POA: Insufficient documentation

## 2016-05-04 DIAGNOSIS — Z85038 Personal history of other malignant neoplasm of large intestine: Secondary | ICD-10-CM | POA: Diagnosis not present

## 2016-05-04 DIAGNOSIS — R0789 Other chest pain: Secondary | ICD-10-CM | POA: Diagnosis not present

## 2016-05-04 DIAGNOSIS — R079 Chest pain, unspecified: Secondary | ICD-10-CM | POA: Diagnosis not present

## 2016-05-04 LAB — CBC
HEMATOCRIT: 41.7 % (ref 40.0–52.0)
HEMOGLOBIN: 13.8 g/dL (ref 13.0–18.0)
MCH: 29.2 pg (ref 26.0–34.0)
MCHC: 33.1 g/dL (ref 32.0–36.0)
MCV: 88.2 fL (ref 80.0–100.0)
Platelets: 300 10*3/uL (ref 150–440)
RBC: 4.73 MIL/uL (ref 4.40–5.90)
RDW: 16.5 % — ABNORMAL HIGH (ref 11.5–14.5)
WBC: 10.4 10*3/uL (ref 3.8–10.6)

## 2016-05-04 LAB — BASIC METABOLIC PANEL
ANION GAP: 9 (ref 5–15)
BUN: 19 mg/dL (ref 6–20)
CALCIUM: 9 mg/dL (ref 8.9–10.3)
CHLORIDE: 101 mmol/L (ref 101–111)
CO2: 31 mmol/L (ref 22–32)
Creatinine, Ser: 1.18 mg/dL (ref 0.61–1.24)
GFR calc non Af Amer: 59 mL/min — ABNORMAL LOW (ref >60)
GLUCOSE: 97 mg/dL (ref 65–99)
Potassium: 3.4 mmol/L — ABNORMAL LOW (ref 3.5–5.1)
Sodium: 141 mmol/L (ref 135–145)

## 2016-05-04 LAB — FIBRIN DERIVATIVES D-DIMER (ARMC ONLY): Fibrin derivatives D-dimer (ARMC): 660 — ABNORMAL HIGH (ref 0–499)

## 2016-05-04 LAB — TROPONIN I: Troponin I: <0.03 ng/mL (ref <0.03)

## 2016-05-04 MED ORDER — IOPAMIDOL (ISOVUE-370) INJECTION 76%
75.0000 mL | Freq: Once | INTRAVENOUS | Status: AC | PRN
  Administered 2016-05-04: 75 mL via INTRAVENOUS

## 2016-05-04 NOTE — ED Triage Notes (Signed)
Pt states he started having chest pain yesterday on the right side, states today the pain came back but was different and in the middle of his chest.  Pt states it almost feels like indigestion. Pt states he does not have a cardiac hx but is being seen by ID for possible Rhuematoid, but they have not given him a complete diagnosis yet. Pt has been on high dose steroids for the past 2-3 months.

## 2016-05-04 NOTE — ED Notes (Signed)
Pt. Verbalizes understanding of d/c instructions and follow-up. VS stable and pain controlled per pt.  Pt. In NAD at time of d/c and denies further concerns regarding this visit. Pt. Stable at the time of departure from the unit, departing unit by the safest and most appropriate manner per that pt condition and limitations. Pt advised to return to the ED at any time for emergent concerns, or for new/worsening symptoms.   

## 2016-05-04 NOTE — ED Provider Notes (Signed)
[facility] Emergency Department Provider Note        Time seen: ----------------------------------------- 7:21 PM on 05/04/2016 -----------------------------------------    I have reviewed the triage vital signs and the nursing notes.   HISTORY  Chief Complaint Chest Pain    HPI Scott Guzman is a 76 y.o. male presents to ER for chest pain on the right side of his chest and upper abdomen. Patient states today the pain was worse than it was yesterday. He describes it as a heartburn type of pain. He has never had a history of this, nothing makes it better or worse. He denies any recent illness, changes in his medicines or other complaints. Currently is on chronic steroids for rheumatoid arthritis   Past Medical History:  Diagnosis Date  . Colon cancer ([place]) 2000   He states he had a partial colon resection.   . Hyperlipidemia   . PVC (premature ventricular contraction)     Patient Active Problem List   Diagnosis Date Noted  . Temporal arteritis ([place]) 03/12/2016  . BPH (benign prostatic hyperplasia) 03/12/2016  . Insomnia 10/31/2014  . Hypercholesterolemia 10/13/2014  . Colon cancer ([place]) 10/13/2014    Past Surgical History:  Procedure Laterality Date  . COLON SURGERY      Allergies Patient has no known allergies.  Social History Social History  Substance Use Topics  . Smoking status: Never Smoker  . Smokeless tobacco: Never Used  . Alcohol use No    Review of Systems Constitutional: Negative for fever. Cardiovascular: Positive for chest pain Respiratory: Negative for shortness of breath. Gastrointestinal: Negative for abdominal pain, vomiting and diarrhea. Genitourinary: Negative for dysuria. Musculoskeletal: Negative for back pain. Skin: Negative for rash. Neurological: Negative for headaches, focal weakness or numbness.  10-point ROS otherwise negative.  ____________________________________________   PHYSICAL  EXAM:  VITAL SIGNS: ED Triage Vitals [05/04/16 1814]  Enc Vitals Group     BP (!) 162/84     Pulse Rate 71     Resp 20     Temp 97.7 F (36.5 C)     Temp Source Oral     SpO2 100 %     Weight 150 lb (68 kg)     Height 5\' 10"  (1.778 m)     Head Circumference      Peak Flow      Pain Score 5     Pain Loc      Pain Edu?      Excl. in [place]?     Constitutional: Alert and oriented. Well appearing and in no distress. Eyes: Conjunctivae are normal. PERRL. Normal extraocular movements. ENT   Head: Normocephalic and atraumatic.   Nose: No congestion/rhinnorhea.   Mouth/Throat: Mucous membranes are moist.   Neck: No stridor. Cardiovascular: Normal rate, regular rhythm. No murmurs, rubs, or gallops. Respiratory: Normal respiratory effort without tachypnea nor retractions. Breath sounds are clear and equal bilaterally. No wheezes/rales/rhonchi. Gastrointestinal: Soft and nontender. Normal bowel sounds Musculoskeletal: Nontender with normal range of motion in all extremities. No lower extremity tenderness nor edema. Neurologic:  Normal speech and language. No gross focal neurologic deficits are appreciated.  Skin:  Skin is warm, dry and intact. No rash noted. Psychiatric: Mood and affect are normal. Speech and behavior are normal.  ____________________________________________  EKG: Interpreted by me. Sinus rhythm rate of 77 bpm, normal PR interval, normal QRS, normal QT, nonspecific ST segment changes  Repeat EKG interpreted by me, heart rate is 60 bpm, normal PR interval,  normal QRS size, normal QT, possible septal infarct age indeterminate. EKG is unchanged from earlier. ____________________________________________  ED COURSE:  Pertinent labs & imaging results that were available during my care of the patient were reviewed by me and considered in my medical decision making (see chart for details). Patient presents to ER with minimal chest pain and nonspecific chest pain at  this time. We will assess with labs and likely repeat troponin.   Procedures ____________________________________________   LABS (pertinent positives/negatives)  Labs Reviewed  BASIC METABOLIC PANEL - Abnormal; Notable for the following:       Result Value   Potassium 3.4 (*)    GFR calc non Af Amer 59 (*)    All other components within normal limits  CBC - Abnormal; Notable for the following:    RDW 16.5 (*)    All other components within normal limits  FIBRIN DERIVATIVES D-DIMER ([facility] ONLY) - Abnormal; Notable for the following:    Fibrin derivatives D-dimer ([facility]) 660 (*)    All other components within normal limits  TROPONIN I  TROPONIN I    RADIOLOGY Images were viewed by me  Chest x-ray Is unremarkable CT angiogram of the chest IMPRESSION: No evidence of pulmonary emboli.  Changes consistent with prior granulomatous disease. ____________________________________________  FINAL ASSESSMENT AND PLAN  Chest pain  Plan: Patient with labs and imaging as dictated above. No clear etiology for the patient's chest pain is been identified. Repeat troponin is negative. D-dimer elevated but CT scan is negative. He is stable for outpatient follow-up.   Earleen Newport, MD   Note: This note was generated in part or whole with voice recognition software. Voice recognition is usually quite accurate but there are transcription errors that can and very often do occur. I apologize for any typographical errors that were not detected and corrected.     Earleen Newport, MD 05/04/16 2131

## 2016-05-15 DIAGNOSIS — Z79899 Other long term (current) drug therapy: Secondary | ICD-10-CM | POA: Diagnosis not present

## 2016-05-23 DIAGNOSIS — M316 Other giant cell arteritis: Secondary | ICD-10-CM | POA: Diagnosis not present

## 2016-05-23 DIAGNOSIS — R700 Elevated erythrocyte sedimentation rate: Secondary | ICD-10-CM | POA: Diagnosis not present

## 2016-05-24 ENCOUNTER — Telehealth: Payer: Self-pay | Admitting: Family Medicine

## 2016-05-24 NOTE — Telephone Encounter (Signed)
Routing to provider  

## 2016-05-24 NOTE — Telephone Encounter (Signed)
Pt would like to be tested for lyme disease.  Pt states that he would like to use [facility] and wonders if he orders the kit are we willing to draw his blood  Please call patient to advise.  [phone]

## 2016-05-28 NOTE — Telephone Encounter (Signed)
I am not sure if the lab can draw this or to order it. If he is able to get the kit and they are allowed to draw it, that should be fine, but we will need to check with [facility] on that.

## 2016-06-06 DIAGNOSIS — R700 Elevated erythrocyte sedimentation rate: Secondary | ICD-10-CM | POA: Diagnosis not present

## 2016-06-06 DIAGNOSIS — M316 Other giant cell arteritis: Secondary | ICD-10-CM | POA: Diagnosis not present

## 2016-06-10 NOTE — Telephone Encounter (Signed)
Call pt 

## 2016-06-11 DIAGNOSIS — R700 Elevated erythrocyte sedimentation rate: Secondary | ICD-10-CM | POA: Diagnosis not present

## 2016-06-19 ENCOUNTER — Encounter: Payer: Self-pay | Admitting: Family Medicine

## 2016-06-19 ENCOUNTER — Ambulatory Visit (INDEPENDENT_AMBULATORY_CARE_PROVIDER_SITE_OTHER): Payer: Medicare Other | Admitting: Family Medicine

## 2016-06-19 VITALS — BP 127/77 | HR 84 | Temp 98.0°F | Wt 147.0 lb

## 2016-06-19 DIAGNOSIS — H6122 Impacted cerumen, left ear: Secondary | ICD-10-CM

## 2016-06-19 MED ORDER — MECLIZINE HCL 25 MG PO TABS: 25.0000 mg | ORAL_TABLET | Freq: Three times a day (TID) | ORAL | 0 refills | Status: DC | PRN

## 2016-06-19 NOTE — Progress Notes (Signed)
   BP 127/77   Pulse 84   Temp 98 F (36.7 C)   Wt 147 lb (66.7 kg)   SpO2 100%   BMI 21.09 kg/m    Subjective:    Patient ID: Scott Guzman, male    DOB: 05-Mar-1941, 76 y.o.   MRN: [id number]  HPI: Scott Guzman is a 76 y.o. male  Chief Complaint  Patient presents with  . Cerumen Impaction    left ear x 2 weeks.    Patient presents with 2 weeks of left ear discomfort and muffled hearing. Asked his rheumatologist to take a look and was told there was an impaction of cerumen, and he would like to have it removed. Has had this happen in the past. Denies congestion, sore throat, cough, fever.   Relevant past medical, surgical, family and social history reviewed and updated as indicated. Interim medical history since our last visit reviewed. Allergies and medications reviewed and updated.  Review of Systems  Constitutional: Negative.   HENT: Positive for ear pain and hearing loss (muffled).   Respiratory: Negative.   Cardiovascular: Negative.   Gastrointestinal: Negative.   Genitourinary: Negative.   Musculoskeletal: Negative.   Neurological: Negative.   Psychiatric/Behavioral: Negative.     Per HPI unless specifically indicated above     Objective:    BP 127/77   Pulse 84   Temp 98 F (36.7 C)   Wt 147 lb (66.7 kg)   SpO2 100%   BMI 21.09 kg/m   Wt Readings from Last 3 Encounters:  06/19/16 147 lb (66.7 kg)  05/04/16 150 lb (68 kg)  04/08/16 147 lb (66.7 kg)    Physical Exam  Constitutional: He is oriented to person, place, and time. He appears well-developed and well-nourished. No distress.  HENT:  Head: Atraumatic.  Right EAC mostly clear, minimal peripheral wax present. TM intact and benign appearing. Left EAC impacted with cerumen. TM not visible  Eyes: Conjunctivae are normal. Pupils are equal, round, and reactive to light.  Neck: Normal range of motion. Neck supple.  Cardiovascular: Normal rate and normal heart sounds.   Pulmonary/Chest: Effort normal  and breath sounds normal. No respiratory distress.  Musculoskeletal: Normal range of motion.  Neurological: He is alert and oriented to person, place, and time.  Skin: Skin is warm and dry.  Psychiatric: He has a normal mood and affect. His behavior is normal.  Nursing note and vitals reviewed.  Procedure Note: Left ear lavage Left ear was lavaged with warm water and hydrogen peroxide. Wax plug easily removed, TM intact and benign. Procedure well tolerated with excellent relief.      Assessment & Plan:   Problem List Items Addressed This Visit    None    Visit Diagnoses    Impacted cerumen of left ear    -  Primary   Ear lavage performed in clinic with good relief. Pt tolerated well. Discussed using debrox drops several times weekly if starting to build up again       Follow up plan: Return for as scheduled.

## 2016-06-19 NOTE — Patient Instructions (Signed)
Debrox drops for earwax softening

## 2016-06-25 ENCOUNTER — Ambulatory Visit (INDEPENDENT_AMBULATORY_CARE_PROVIDER_SITE_OTHER): Payer: Medicare Other | Admitting: Family Medicine

## 2016-06-25 ENCOUNTER — Encounter: Payer: Self-pay | Admitting: Family Medicine

## 2016-06-25 DIAGNOSIS — A6920 Lyme disease, unspecified: Secondary | ICD-10-CM | POA: Diagnosis not present

## 2016-06-25 DIAGNOSIS — R002 Palpitations: Secondary | ICD-10-CM

## 2016-06-25 DIAGNOSIS — R5383 Other fatigue: Secondary | ICD-10-CM | POA: Insufficient documentation

## 2016-06-25 DIAGNOSIS — R5382 Chronic fatigue, unspecified: Secondary | ICD-10-CM | POA: Diagnosis not present

## 2016-06-25 NOTE — Progress Notes (Signed)
BP 134/84   Pulse 80   Wt 149 lb 6.4 oz (67.8 kg)   SpO2 99%   BMI 21.44 kg/m    Subjective:    Patient ID: Scott Guzman, male    DOB: 02-14-1941, 76 y.o.   MRN: [id number]  HPI: Scott Guzman is a 76 y.o. male  Chief Complaint  Patient presents with  . Follow-up    Lyme test.  Patient concern still having Lyme symptoms and has Lyme testing kit to send to a lab in [place]. These labs were drawn centrifuged and given to the patient's possession for mailing. Venipuncture done without problems. Patient also still having marked palpitations symptoms with intermittent symptoms of racing heart said 3-4 times this week and lasting about 30 minutes or so. Patient's been to the emergency room but of course racing heart stop by the time he had EKGs done which was normal. The symptoms are very disconcerting and he feels like he may be falling out. Palpitations really gone on almost all year 2-3 times a week and has had days where he doesn't feel good because the Lyme disease testing is now tapering off of prednisone. While on prednisone he did feel good.  Relevant past medical, surgical, family and social history reviewed and updated as indicated. Interim medical history since our last visit reviewed. Allergies and medications reviewed and updated.  Review of Systems  Constitutional: Positive for fatigue.  Respiratory: Negative.   Cardiovascular: Positive for palpitations. Negative for chest pain.    Per HPI unless specifically indicated above     Objective:    BP 134/84   Pulse 80   Wt 149 lb 6.4 oz (67.8 kg)   SpO2 99%   BMI 21.44 kg/m   Wt Readings from Last 3 Encounters:  06/25/16 149 lb 6.4 oz (67.8 kg)  06/19/16 147 lb (66.7 kg)  05/04/16 150 lb (68 kg)    Physical Exam  Constitutional: He is oriented to person, place, and time. He appears well-developed and well-nourished.  HENT:  Head: Normocephalic and atraumatic.  Eyes: Conjunctivae and EOM are normal.  Neck:  Normal range of motion.  Cardiovascular: Normal rate, regular rhythm and normal heart sounds.   Pulmonary/Chest: Effort normal and breath sounds normal.  Musculoskeletal: Normal range of motion.  Neurological: He is alert and oriented to person, place, and time.  Skin: No erythema.  Psychiatric: He has a normal mood and affect. His behavior is normal. Judgment and thought content normal.   EKG normal sinus rhythm with occasional PACs but otherwise normal.  Results for orders placed or performed during the hospital encounter of 01/00/71  Basic metabolic panel  Result Value Ref Range   Sodium 141 135 - 145 mmol/L   Potassium 3.4 (L) 3.5 - 5.1 mmol/L   Chloride 101 101 - 111 mmol/L   CO2 31 22 - 32 mmol/L   Glucose, Bld 97 65 - 99 mg/dL   BUN 19 6 - 20 mg/dL   Creatinine, Ser 1.18 0.61 - 1.24 mg/dL   Calcium 9.0 8.9 - 10.3 mg/dL   GFR calc non Af Amer 59 (L) >60 mL/min   GFR calc Af Amer >60 >60 mL/min   Anion gap 9 5 - 15  CBC  Result Value Ref Range   WBC 10.4 3.8 - 10.6 K/uL   RBC 4.73 4.40 - 5.90 MIL/uL   Hemoglobin 13.8 13.0 - 18.0 g/dL   HCT 41.7 40.0 - 52.0 %   MCV 88.2  80.0 - 100.0 fL   MCH 29.2 26.0 - 34.0 pg   MCHC 33.1 32.0 - 36.0 g/dL   RDW 16.5 (H) 11.5 - 14.5 %   Platelets 300 150 - 440 K/uL  Troponin I  Result Value Ref Range   Troponin I <0.03 <0.03 ng/mL  Fibrin derivatives D-Dimer ([facility] only)  Result Value Ref Range   Fibrin derivatives D-dimer ([facility]) 660 (H) 0 - 499  Troponin I  Result Value Ref Range   Troponin I <0.03 <0.03 ng/mL      Assessment & Plan:   Problem List Items Addressed This Visit      Other   Palpitations    With long history of palpitations will do EKG today and schedule 24-hour Holter monitor.      Relevant Orders   EKG 12-Lead (Completed)   Fatigue    With ongoing fatigue symptoms and Lyme disease concerns will send testing          Follow up plan: Return if symptoms worsen or fail to improve, for As  scheduled.

## 2016-06-25 NOTE — Addendum Note (Signed)
Addended by: Gerda Diss A on: 06/25/2016 11:16 AM   Modules accepted: Orders

## 2016-06-25 NOTE — Assessment & Plan Note (Signed)
With long history of palpitations will do EKG today and schedule 24-hour Holter monitor.

## 2016-06-25 NOTE — Assessment & Plan Note (Signed)
With ongoing fatigue symptoms and Lyme disease concerns will send testing

## 2016-06-26 ENCOUNTER — Telehealth: Payer: Self-pay | Admitting: Family Medicine

## 2016-06-26 NOTE — Telephone Encounter (Signed)
Called to check on the 24 hr monitor Dr Jeananne Rama was going to order for him yesterday. Per patient he has not heard anything and he just wanted to make sure he was not supposed to be doing something.   Thanks

## 2016-06-26 NOTE — Telephone Encounter (Signed)
Called [facility] -  to verify process. Went ahead and scheduled for 07/01/16 @ 9 a.m. Called pt to relay, he is unavailable at that time. Pt was given [facility]'s number to reschedule.   Address: [address], [place], [place] [address] Hours: Open ? Closes 5PM Phone: [phone]

## 2016-07-02 ENCOUNTER — Other Ambulatory Visit: Payer: Self-pay

## 2016-07-02 ENCOUNTER — Ambulatory Visit (INDEPENDENT_AMBULATORY_CARE_PROVIDER_SITE_OTHER): Payer: Medicare Other

## 2016-07-02 DIAGNOSIS — R002 Palpitations: Secondary | ICD-10-CM | POA: Diagnosis not present

## 2016-07-05 ENCOUNTER — Ambulatory Visit
Admission: RE | Admit: 2016-07-05 | Discharge: 2016-07-05 | Disposition: A | Discharge status: Home / Self Care | Payer: Medicare Other | Source: Ambulatory Visit | Attending: Internal Medicine | Admitting: Internal Medicine

## 2016-07-23 DIAGNOSIS — R700 Elevated erythrocyte sedimentation rate: Secondary | ICD-10-CM | POA: Diagnosis not present

## 2016-07-31 DIAGNOSIS — R079 Chest pain, unspecified: Secondary | ICD-10-CM | POA: Diagnosis not present

## 2016-07-31 DIAGNOSIS — R700 Elevated erythrocyte sedimentation rate: Secondary | ICD-10-CM | POA: Diagnosis not present

## 2016-08-16 DIAGNOSIS — R0789 Other chest pain: Secondary | ICD-10-CM | POA: Diagnosis not present

## 2016-08-16 DIAGNOSIS — E7800 Pure hypercholesterolemia, unspecified: Secondary | ICD-10-CM | POA: Diagnosis not present

## 2016-08-16 DIAGNOSIS — R0602 Shortness of breath: Secondary | ICD-10-CM | POA: Diagnosis not present

## 2016-08-19 DIAGNOSIS — R0609 Other forms of dyspnea: Secondary | ICD-10-CM | POA: Insufficient documentation

## 2016-08-19 DIAGNOSIS — R0602 Shortness of breath: Secondary | ICD-10-CM | POA: Insufficient documentation

## 2016-08-19 DIAGNOSIS — R0600 Dyspnea, unspecified: Secondary | ICD-10-CM | POA: Insufficient documentation

## 2016-08-19 DIAGNOSIS — R0789 Other chest pain: Secondary | ICD-10-CM | POA: Insufficient documentation

## 2016-08-20 DIAGNOSIS — R509 Fever, unspecified: Secondary | ICD-10-CM | POA: Diagnosis not present

## 2016-08-20 DIAGNOSIS — R700 Elevated erythrocyte sedimentation rate: Secondary | ICD-10-CM | POA: Diagnosis not present

## 2016-09-05 ENCOUNTER — Other Ambulatory Visit: Payer: Self-pay | Admitting: Family Medicine

## 2016-09-05 NOTE — Telephone Encounter (Signed)
Your patient 

## 2016-09-12 DIAGNOSIS — R0789 Other chest pain: Secondary | ICD-10-CM | POA: Diagnosis not present

## 2016-09-12 DIAGNOSIS — R0602 Shortness of breath: Secondary | ICD-10-CM | POA: Diagnosis not present

## 2016-09-26 DIAGNOSIS — R700 Elevated erythrocyte sedimentation rate: Secondary | ICD-10-CM | POA: Diagnosis not present

## 2016-10-18 DIAGNOSIS — R0602 Shortness of breath: Secondary | ICD-10-CM | POA: Diagnosis not present

## 2016-10-18 DIAGNOSIS — E7800 Pure hypercholesterolemia, unspecified: Secondary | ICD-10-CM | POA: Diagnosis not present

## 2016-10-18 DIAGNOSIS — R0789 Other chest pain: Secondary | ICD-10-CM | POA: Diagnosis not present

## 2016-11-13 DIAGNOSIS — R700 Elevated erythrocyte sedimentation rate: Secondary | ICD-10-CM | POA: Diagnosis not present

## 2016-12-09 DIAGNOSIS — R700 Elevated erythrocyte sedimentation rate: Secondary | ICD-10-CM | POA: Diagnosis not present

## 2016-12-12 ENCOUNTER — Telehealth: Payer: Self-pay | Admitting: Family Medicine

## 2016-12-12 ENCOUNTER — Other Ambulatory Visit: Payer: Self-pay

## 2016-12-12 MED ORDER — MIRTAZAPINE 30 MG PO TABS: 30.0000 mg | ORAL_TABLET | Freq: Every day | ORAL | 0 refills | Status: DC

## 2016-12-12 NOTE — Telephone Encounter (Signed)
Routing to provider. Appt on 03/13/17.

## 2016-12-12 NOTE — Telephone Encounter (Signed)
Faxed script to [facility] [phone] [place]

## 2016-12-25 DIAGNOSIS — H2513 Age-related nuclear cataract, bilateral: Secondary | ICD-10-CM | POA: Diagnosis not present

## 2016-12-27 ENCOUNTER — Telehealth: Payer: Self-pay | Admitting: Family Medicine

## 2016-12-27 NOTE — Telephone Encounter (Signed)
Pt declined awv visit

## 2017-01-07 ENCOUNTER — Encounter: Payer: Self-pay | Admitting: Family Medicine

## 2017-01-07 ENCOUNTER — Ambulatory Visit (INDEPENDENT_AMBULATORY_CARE_PROVIDER_SITE_OTHER): Payer: Medicare Other | Admitting: Family Medicine

## 2017-01-07 VITALS — BP 131/73 | HR 73 | Temp 97.8°F | Wt 139.0 lb

## 2017-01-07 DIAGNOSIS — M545 Low back pain, unspecified: Secondary | ICD-10-CM

## 2017-01-07 MED ORDER — BACLOFEN 5 MG PO TABS: 5.0000 mg | ORAL_TABLET | Freq: Three times a day (TID) | ORAL | 0 refills | Status: DC | PRN

## 2017-01-07 MED ORDER — HYDROCODONE-ACETAMINOPHEN 5-325 MG PO TABS: 1.0000 | ORAL_TABLET | Freq: Four times a day (QID) | ORAL | 0 refills | Status: DC | PRN

## 2017-01-07 NOTE — Patient Instructions (Signed)
Follow up as needed

## 2017-01-07 NOTE — Progress Notes (Signed)
   BP 131/73   Pulse 73   Temp 97.8 F (36.6 C)   Wt 139 lb (63 kg)   SpO2 99%   BMI 19.94 kg/m    Subjective:    Patient ID: Scott Guzman, male    DOB: 11-12-1940, 76 y.o.   MRN: [id number]  HPI: Scott Guzman is a 76 y.o. male  Chief Complaint  Patient presents with  . Back Pain    tree fell on his back this morning, hurting in his lower back.   Patient presents with low back pain since being hit across the back with a tree branch this morning while cutting limbs. No head trauma, LOC, numbness or tingling down legs, bowel or bladder incontinence, dyspnea, or wound. Took tylenol with mild relief. Able to walk, just very sore.   Relevant past medical, surgical, family and social history reviewed and updated as indicated. Interim medical history since our last visit reviewed. Allergies and medications reviewed and updated.  Review of Systems  Constitutional: Negative.   Respiratory: Negative.   Cardiovascular: Negative.   Gastrointestinal: Negative.   Genitourinary: Negative.   Musculoskeletal: Positive for arthralgias and back pain.  Neurological: Negative.   Psychiatric/Behavioral: Negative.    Per HPI unless specifically indicated above     Objective:    BP 131/73   Pulse 73   Temp 97.8 F (36.6 C)   Wt 139 lb (63 kg)   SpO2 99%   BMI 19.94 kg/m   Wt Readings from Last 3 Encounters:  01/07/17 139 lb (63 kg)  06/25/16 149 lb 6.4 oz (67.8 kg)  06/19/16 147 lb (66.7 kg)    Physical Exam  Constitutional: He is oriented to person, place, and time. He appears well-developed and well-nourished. No distress.  HENT:  Head: Atraumatic.  Eyes: Pupils are equal, round, and reactive to light. Conjunctivae are normal.  Neck: Normal range of motion. Neck supple.  Cardiovascular: Normal rate and normal heart sounds.   Pulmonary/Chest: Effort normal and breath sounds normal. No respiratory distress.  Musculoskeletal: He exhibits tenderness (ttp b/l low back at site of  impact, but no spinal ttp).  Antalgic gait and movements, but ROM intact  Neurological: He is alert and oriented to person, place, and time. No cranial nerve deficit. He exhibits normal muscle tone. Coordination normal.  Skin: Skin is warm and dry.  No visible bruising or abrasions from impact  Psychiatric: He has a normal mood and affect. His behavior is normal.  Nursing note and vitals reviewed.     Assessment & Plan:   Problem List Items Addressed This Visit    None    Visit Diagnoses    Acute bilateral low back pain without sciatica    -  Primary   Following trauma from fallen tree branch. No evidence of fx, difficulty breathing, or internal bleeding. Norco and baclofen prn, precautions reviewed at length   Relevant Medications   HYDROcodone-acetaminophen (NORCO/VICODIN) 5-325 MG tablet   Baclofen 5 MG TABS    Strict return precautions reviewed with patient and patient's wife.    Follow up plan: Return if symptoms worsen or fail to improve.

## 2017-01-08 ENCOUNTER — Encounter: Payer: Self-pay | Admitting: Family Medicine

## 2017-01-09 ENCOUNTER — Ambulatory Visit (INDEPENDENT_AMBULATORY_CARE_PROVIDER_SITE_OTHER): Payer: Medicare Other | Admitting: Family Medicine

## 2017-01-09 ENCOUNTER — Encounter: Payer: Self-pay | Admitting: Family Medicine

## 2017-01-09 VITALS — BP 113/71 | HR 86 | Temp 98.4°F | Wt 140.0 lb

## 2017-01-09 DIAGNOSIS — M545 Low back pain, unspecified: Secondary | ICD-10-CM

## 2017-01-09 DIAGNOSIS — K5903 Drug induced constipation: Secondary | ICD-10-CM | POA: Diagnosis not present

## 2017-01-09 NOTE — Patient Instructions (Addendum)
Increase miralax as needed Start taking dulcolax several times daily until BM is achieved Increase water intake and fiber If all else fails, pick up an enema  If feeling dizzy, can try a meclizine and increasing salt intake

## 2017-01-09 NOTE — Progress Notes (Signed)
BP 113/71   Pulse 86   Temp 98.4 F (36.9 C)   Wt 140 lb (63.5 kg)   SpO2 96%   BMI 20.09 kg/m    Subjective:    Patient ID: Scott Guzman, male    DOB: 1940/11/01, 76 y.o.   MRN: [id number]  HPI: Scott Guzman is a 76 y.o. male  Chief Complaint  Patient presents with  . Back Pain    not feeling any better  . Constipation    last BM was Friday or Sat. Been using Miralax with no relief. He states he doesn't feel like he's constipated.   Patient here today following up on mid/low back pain after a branch fell on him 2 days ago. Back is still very sore, but some better and he is better able to ambulate today. Continues to not have leg weakness, incontinence, numbness or tingling. Taking 1/2 tab hydrocodone prn, still has plenty left from previous visit. Providing some good relief from the pain.   Had some constipation issues prior to incident that have naturally gotten worse with the addition of pain medication. Has been trying to drink more water and taking several capfuls of miralax daily with no relief. Last BM was Saturday. Pt concerned because he doesn't even feel an urge to go. No abdominal pain, N/V.   Past Medical History:  Diagnosis Date  . Colon cancer ([place]) 2000   He states he had a partial colon resection.   . Hyperlipidemia   . PVC (premature ventricular contraction)    Social History   Social History  . Marital status: Married    Spouse name: N/A  . Number of children: N/A  . Years of education: N/A   Occupational History  . Not on file.   Social History Main Topics  . Smoking status: Never Smoker  . Smokeless tobacco: Never Used  . Alcohol use No  . Drug use: No  . Sexual activity: Not on file   Other Topics Concern  . Not on file   Social History Narrative  . No narrative on file    Relevant past medical, surgical, family and social history reviewed and updated as indicated. Interim medical history since our last visit reviewed. Allergies and  medications reviewed and updated.  Review of Systems  Constitutional: Negative.   Respiratory: Negative.   Cardiovascular: Negative.   Gastrointestinal: Positive for constipation.  Genitourinary: Negative.   Musculoskeletal: Positive for back pain.  Neurological: Negative.   Psychiatric/Behavioral: Negative.    Per HPI unless specifically indicated above     Objective:    BP 113/71   Pulse 86   Temp 98.4 F (36.9 C)   Wt 140 lb (63.5 kg)   SpO2 96%   BMI 20.09 kg/m   Wt Readings from Last 3 Encounters:  01/11/17 150 lb (68 kg)  01/09/17 140 lb (63.5 kg)  01/07/17 139 lb (63 kg)    Physical Exam  Constitutional: He is oriented to person, place, and time. He appears well-developed and well-nourished. No distress.  HENT:  Head: Atraumatic.  Eyes: Pupils are equal, round, and reactive to light. Conjunctivae are normal.  Cardiovascular: Normal rate and normal heart sounds.   Pulmonary/Chest: Effort normal and breath sounds normal. No respiratory distress.  Abdominal: Soft. Bowel sounds are normal. He exhibits no mass. There is no tenderness. There is no guarding.  Musculoskeletal: He exhibits tenderness (TTP b/l lower back). He exhibits no edema or deformity.  Antalgic movements, but  ROM and strength intact No spinal TTP   Neurological: He is alert and oriented to person, place, and time.  Skin: Skin is warm and dry.  No bruising or abrasions noted across back  Psychiatric: He has a normal mood and affect. His behavior is normal.  Nursing note and vitals reviewed.  Results for orders placed or performed during the hospital encounter of 65/78/46  Basic metabolic panel  Result Value Ref Range   Sodium 141 135 - 145 mmol/L   Potassium 3.4 (L) 3.5 - 5.1 mmol/L   Chloride 101 101 - 111 mmol/L   CO2 31 22 - 32 mmol/L   Glucose, Bld 97 65 - 99 mg/dL   BUN 19 6 - 20 mg/dL   Creatinine, Ser 1.18 0.61 - 1.24 mg/dL   Calcium 9.0 8.9 - 10.3 mg/dL   GFR calc non Af Amer 59 (L)  >60 mL/min   GFR calc Af Amer >60 >60 mL/min   Anion gap 9 5 - 15  CBC  Result Value Ref Range   WBC 10.4 3.8 - 10.6 K/uL   RBC 4.73 4.40 - 5.90 MIL/uL   Hemoglobin 13.8 13.0 - 18.0 g/dL   HCT 41.7 40.0 - 52.0 %   MCV 88.2 80.0 - 100.0 fL   MCH 29.2 26.0 - 34.0 pg   MCHC 33.1 32.0 - 36.0 g/dL   RDW 16.5 (H) 11.5 - 14.5 %   Platelets 300 150 - 440 K/uL  Troponin I  Result Value Ref Range   Troponin I <0.03 <0.03 ng/mL  Fibrin derivatives D-Dimer ([facility] only)  Result Value Ref Range   Fibrin derivatives D-dimer ([facility]) 660 (H) 0 - 499  Troponin I  Result Value Ref Range   Troponin I <0.03 <0.03 ng/mL      Assessment & Plan:   Problem List Items Addressed This Visit    None    Visit Diagnoses    Acute bilateral low back pain without sciatica    -  Primary   Slowly improving after branch accident, no evidence of fx or neurologic complications. Continue prn hydrocodone and rest, heat pads, muscle rubs   Drug-induced constipation       Start fiber supplement, increase water, increase miralax dose every few hours until successful BM. Add dulcolax BID. If no success, perform enema and call       Follow up plan: Return for as scheduled.

## 2017-01-10 ENCOUNTER — Encounter: Payer: Self-pay | Admitting: Family Medicine

## 2017-01-11 ENCOUNTER — Emergency Department
Admission: EM | Admit: 2017-01-11 | Discharge: 2017-01-11 | Disposition: A | Discharge status: Home / Self Care | Payer: Medicare Other | Attending: Emergency Medicine | Admitting: Emergency Medicine

## 2017-01-11 ENCOUNTER — Emergency Department: Payer: Medicare Other

## 2017-01-11 DIAGNOSIS — S299XXA Unspecified injury of thorax, initial encounter: Secondary | ICD-10-CM | POA: Diagnosis present

## 2017-01-11 DIAGNOSIS — Y929 Unspecified place or not applicable: Secondary | ICD-10-CM | POA: Insufficient documentation

## 2017-01-11 DIAGNOSIS — W208XXA Other cause of strike by thrown, projected or falling object, initial encounter: Secondary | ICD-10-CM | POA: Insufficient documentation

## 2017-01-11 DIAGNOSIS — Z85038 Personal history of other malignant neoplasm of large intestine: Secondary | ICD-10-CM | POA: Insufficient documentation

## 2017-01-11 DIAGNOSIS — S22089A Unspecified fracture of T11-T12 vertebra, initial encounter for closed fracture: Secondary | ICD-10-CM | POA: Diagnosis not present

## 2017-01-11 DIAGNOSIS — Y998 Other external cause status: Secondary | ICD-10-CM | POA: Diagnosis not present

## 2017-01-11 DIAGNOSIS — Z79899 Other long term (current) drug therapy: Secondary | ICD-10-CM | POA: Diagnosis not present

## 2017-01-11 DIAGNOSIS — M545 Low back pain: Secondary | ICD-10-CM | POA: Diagnosis not present

## 2017-01-11 DIAGNOSIS — Y9389 Activity, other specified: Secondary | ICD-10-CM | POA: Insufficient documentation

## 2017-01-11 DIAGNOSIS — S22080A Wedge compression fracture of T11-T12 vertebra, initial encounter for closed fracture: Secondary | ICD-10-CM | POA: Diagnosis not present

## 2017-01-11 LAB — URINALYSIS, COMPLETE (UACMP) WITH MICROSCOPIC
BACTERIA UA: NONE SEEN
Bilirubin Urine: NEGATIVE
GLUCOSE, UA: NEGATIVE mg/dL
Hgb urine dipstick: NEGATIVE
Ketones, ur: NEGATIVE mg/dL
Leukocytes, UA: NEGATIVE
Nitrite: NEGATIVE
PROTEIN: NEGATIVE mg/dL
SQUAMOUS EPITHELIAL / LPF: NONE SEEN
Specific Gravity, Urine: 1.016 (ref 1.005–1.030)
pH: 6 (ref 5.0–8.0)

## 2017-01-11 MED ORDER — OXYCODONE-ACETAMINOPHEN 5-325 MG PO TABS: 1.0000 | ORAL_TABLET | Freq: Three times a day (TID) | ORAL | 0 refills | Status: DC | PRN

## 2017-01-11 MED ORDER — BACLOFEN 10 MG PO TABS: ORAL_TABLET | ORAL | 1 refills | Status: AC

## 2017-01-11 NOTE — ED Notes (Signed)
Pt was seen 10/16 after being hit by a tree branch in his upper back. Pt states was sent home with prescription for norco 5-325. Pt states on Thursday was offered for another prescription for more pain medication and initially declined. Pt states now he only has 3 pills left and continues to have pain and thinks he needs more pain medication. Pt states "they didn't do an x-ray and I think I should have an x-ray done". Pt is ambulatory without difficulty, denies any new or worsening symptoms at this time. Pt states called Dr. Tedd Sias emergency line but his PCP was unable to write a prescription for him due to him not being in the office and MD not being able to call prescription for narcotic in.

## 2017-01-11 NOTE — ED Triage Notes (Signed)
Pt reports was cutting down a tree and part of tree fell on him. Pt has been evaluated by PCP, was given baclofen and hydrocodone, has run out of pain prescription and needs refill. Reports pain 9/10 while moving

## 2017-01-11 NOTE — Discharge Instructions (Signed)
Follow-up with Dr. Rance Muir office this coming week for reevaluation of your  back pain. Discontinue taking hydrocodone for pain. Begin taking oxycodone 1 tablet every 8 hours as needed for severe pain. Be aware that this could cause drowsiness increase your risk for falling. Also continue taking MiraLAX on a daily basis to prevent constipation due to narcotic use. Continue with baclofen one half tablet twice a day during the day and increased to baclofen 10 mg at bedtime. You may also use ice to your back to reduce pain.

## 2017-01-11 NOTE — ED Provider Notes (Signed)
[facility] Emergency Department Provider Note  ____________________________________________   First MD Initiated Contact with Patient 01/11/17 1228     (approximate)  I have reviewed the triage vital signs and the nursing notes.   HISTORY  Chief Complaint Medication Refill   HPI Scott Guzman is a 76 y.o. male is here with complaint of continued back pain. Patient was seen in his PCPs office this week after a tree limb fell on him. This was a very large tree limb and pinned him to the ground. He was able to get out on his own. He is given a prescription for hydrocodone and baclofen. He has had some issues withconstipation while taking this medication but was able to have a bowel movement after using MiraLAX and another over-the-counter medication. Patient states that he is taking medication as directed by his doctorbut still continues to be in pain. He denies any paresthesias to his lower extremities. He is now aware of any hematuria. He denies any head injury or loss of consciousness.patient rates his pain as a 10 over 10 with movement.   Past Medical History:  Diagnosis Date  . Colon cancer ([place]) 2000   He states he had a partial colon resection.   . Hyperlipidemia   . PVC (premature ventricular contraction)     Patient Active Problem List   Diagnosis Date Noted  . Palpitations 06/25/2016  . Fatigue 06/25/2016  . Temporal arteritis ([place]) 03/12/2016  . BPH (benign prostatic hyperplasia) 03/12/2016  . Insomnia 10/31/2014  . Hypercholesterolemia 10/13/2014  . Colon cancer ([place]) 10/13/2014    Past Surgical History:  Procedure Laterality Date  . COLON SURGERY      Prior to Admission medications   Medication Sig Start Date End Date Taking? Authorizing Provider  baclofen (LIORESAL) 10 MG tablet One tablet at bedtime 01/11/17 01/22/17  Letitia Neri L, PA-C  Baclofen 5 MG TABS Take 5 mg by mouth 3 (three) times daily as needed. 01/07/17   Volney American, PA-C  meclizine (ANTIVERT) 25 MG tablet Take 1 tablet (25 mg total) by mouth 3 (three) times daily as needed for dizziness. Patient not taking: Reported on 01/07/2017 06/19/16   Volney American, PA-C  mirtazapine (REMERON) 30 MG tablet Take 1 tablet (30 mg total) by mouth at bedtime. 12/12/16   Volney American, PA-C  oxyCODONE-acetaminophen (PERCOCET) 5-325 MG tablet Take 1 tablet by mouth every 8 (eight) hours as needed for severe pain. 01/11/17   Johnn Hai, PA-C  predniSONE (DELTASONE) 20 MG tablet Take 7.5 mg by mouth daily.  02/25/16   [provider]    Allergies Patient has no known allergies.  No family history on file.  Social History Social History  Substance Use Topics  . Smoking status: Never Smoker  . Smokeless tobacco: Never Used  . Alcohol use No    Review of Systems Constitutional: No fever/chills Eyes: No visual changes. ENT: no trauma Cardiovascular: Denies chest pain. Respiratory: Denies shortness of breath. Gastrointestinal: No abdominal pain.  No nausea, no vomiting.    positive constipation. Genitourinary: Negative for dysuria.negative for hematuria Musculoskeletal: positive for back pain. Skin: Negative for rash. Neurological: Negative for headaches, focal weakness or numbness. ____________________________________________   PHYSICAL EXAM:  VITAL SIGNS: ED Triage Vitals [01/11/17 1125]  Enc Vitals Group     BP 114/60     Pulse Rate 88     Resp 16     Temp 98.8 F (37.1 C)  Temp Source Oral     SpO2 97 %     Weight 150 lb (68 kg)     Height 5\' 10"  (1.778 m)     Head Circumference      Peak Flow      Pain Score      Pain Loc      Pain Edu?      Excl. in [place]?    Constitutional: Alert and oriented. Well appearing and in no acute distress. Eyes: Conjunctivae are normal.  Head: Atraumatic. Nose: no trauma Neck: No stridor.  no cervical tenderness on palpation posteriorly. Cardiovascular: Normal  rate, regular rhythm. Grossly normal heart sounds.  Good peripheral circulation. Respiratory: Normal respiratory effort.  No retractions. Lungs CTAB. Chest wall nontender to palpation. No ecchymosis or abrasions were seen. Gastrointestinal: Soft and nontender. No distention.  Musculoskeletal: there is moderate tenderness on palpation of the lower thoracic and upper lumbar spine. No soft tissue swelling, ecchymosis, abrasions were noted. Range of motion is restricted secondary to patient's discomfort. Good muscle strength bilateral lower extremities. Neurologic:  Normal speech and language. No gross focal neurologic deficits are appreciated.  Skin:  Skin is warm, dry and intact. As noted above. Psychiatric: Mood and affect are normal. Speech and behavior are normal.  ____________________________________________   LABS (all labs ordered are listed, but only abnormal results are displayed)  Labs Reviewed  URINALYSIS, COMPLETE (UACMP) WITH MICROSCOPIC - Abnormal; Notable for the following:       Result Value   Color, Urine YELLOW (*)    APPearance CLEAR (*)    All other components within normal limits  RADIOLOGY  Dg Thoracic Spine 2 View  Result Date: 01/11/2017 CLINICAL DATA:  76 year old male with thoracic and lumbar spine pain. A tree fell on his back this past week. EXAM: THORACIC SPINE 2 VIEWS COMPARISON:  Prior CT scan of the chest 05/04/2016 FINDINGS: Compression fracture of the T12 vertebral body with approximately 50% height loss anteriorly. This is a new finding compared to the prior CT scan dated 05/04/2016. The remaining vertebral body heights are maintained. No visualized rib fracture. The visualized lungs are clear. Atherosclerotic calcifications are present in the transverse aorta. IMPRESSION: 1. T12 compression fracture with approximately 50% height loss anteriorly is a new finding compared to 05/04/2016. This may be acute or subacute. 2.  Aortic Atherosclerosis (ICD10-170.0)  Electronically Signed   By: Jacqulynn Cadet M.D.   On: 01/11/2017 13:24   Dg Lumbar Spine 2-3 Views  Result Date: 01/11/2017 CLINICAL DATA:  Acute low back pain following injury 4 days ago. Initial encounter. EXAM: LUMBAR SPINE - 2-3 VIEW COMPARISON:  05/04/2016 chest CT appear 01/11/2016 abdominal and pelvic CT FINDINGS: A 60% anterior wedge compression fracture of T12 is noted, new since 05/04/2016. No other fracture or subluxation identified. No definite bony retropulsion is noted. 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 IMPRESSION: 60% anterior wedge compression fracture of T12. No definite bony retropulsion. Electronically Signed   By: Margarette Canada M.D.   On: 01/11/2017 13:27    ____________________________________________   PROCEDURES  Procedure(s) performed: None  Procedures  Critical Care performed: No  ____________________________________________   INITIAL IMPRESSION / ASSESSMENT AND PLAN / ED COURSE  As part of my medical decision making, I reviewed the following data within the electronic MEDICAL RECORD NUMBER Notes from prior ED visits and [place] Controlled Substance Database  x-ray findings were discussed with patient and wife. Patient was given a picture  of his T12  compression fracture. We discussed pain management. Patient states that hydrocodone does not help with relief a lot of his pain. He will discontinue taking this medication again taking oxycodone/acetaminophen every 8 hours as needed for pain. He currently has prescription for baclofen 10 mg which he has been taking one half tablet 3 times a day. Patient voices that he is also having difficulty sleeping. He is to continue taking his half tablet of baclofen during the day and will increase to 10 mg baclofen at at bedtime and a separate prescription forthis was written. Patient is follow-up with his PCP next week for reevaluation of his pain.   ___________________________________________   FINAL  CLINICAL IMPRESSION(S) / ED DIAGNOSES  Final diagnoses:  Compression fracture of T12 vertebra ([place])      NEW MEDICATIONS STARTED DURING THIS VISIT:  Discharge Medication List as of 01/11/2017  2:44 PM    START taking these medications   Details  !! baclofen (LIORESAL) 10 MG tablet One tablet at bedtime, Print    oxyCODONE-acetaminophen (PERCOCET) 5-325 MG tablet Take 1 tablet by mouth every 8 (eight) hours as needed for severe pain., Starting Sat 01/11/2017, Print     !! - Potential duplicate medications found. Please discuss with provider.       Note:  This document was prepared using Dragon voice recognition software and may include unintentional dictation errors.    Johnn Hai, PA-C 01/11/17 1616    Lisa Roca, MD 01/12/17 [phone]

## 2017-01-11 NOTE — ED Notes (Signed)
Pt discharged to home.  Family member driving.  Discharge instructions reviewed.  Verbalized understanding.  No questions or concerns at this time.  Teach back verified.  Pt in NAD.  No items left in ED.   

## 2017-01-13 ENCOUNTER — Encounter: Payer: Self-pay | Admitting: Family Medicine

## 2017-01-13 ENCOUNTER — Other Ambulatory Visit: Payer: Self-pay

## 2017-01-13 ENCOUNTER — Ambulatory Visit (INDEPENDENT_AMBULATORY_CARE_PROVIDER_SITE_OTHER): Payer: Medicare Other | Admitting: Family Medicine

## 2017-01-13 VITALS — BP 172/76 | HR 57 | Temp 98.2°F | Wt 140.0 lb

## 2017-01-13 DIAGNOSIS — S22000A Wedge compression fracture of unspecified thoracic vertebra, initial encounter for closed fracture: Secondary | ICD-10-CM | POA: Insufficient documentation

## 2017-01-13 DIAGNOSIS — S22000D Wedge compression fracture of unspecified thoracic vertebra, subsequent encounter for fracture with routine healing: Secondary | ICD-10-CM

## 2017-01-13 DIAGNOSIS — K5903 Drug induced constipation: Secondary | ICD-10-CM | POA: Diagnosis not present

## 2017-01-13 DIAGNOSIS — Z8781 Personal history of (healed) traumatic fracture: Secondary | ICD-10-CM | POA: Insufficient documentation

## 2017-01-13 NOTE — Assessment & Plan Note (Addendum)
Still hurting quite a bit, but continues to have good ROM and functionality. Discussed several options with pt, including referral to specialist for consult. Pt opting to monitor for another week or so using prn percocet, rest, bracing for any activity, lidocaine patches. D/c baclofen as seeming to cause memory issues and is not helping noticeably. Return precautions reviewed again

## 2017-01-13 NOTE — Progress Notes (Signed)
BP (!) 172/76   Pulse (!) 57   Temp 98.2 F (36.8 C)   Wt 140 lb (63.5 kg)   SpO2 98%   BMI 20.09 kg/m    Subjective:    Patient ID: Scott Guzman, male    DOB: [birth year], 76 y.o.   MRN: [id number]  HPI: Scott Guzman is a 76 y.o. male  Chief Complaint  Patient presents with  . Back Pain    Went to ED for continued pain. Was diagnosed with a T12 compression fracture. States pain is about the same.   Patient presents for ER f/u for his back pain. Went over weekend because he ran out of his pain medication and could not get it refilled after hours. X-ray of thoracic and lumbar spine performed and revealed T 12 compression fx, subacute vs acute. Given percocet and increased baclofen dose and told to rest and avoid most activities. Pain has been difficult to control even with percocet, but he feels he may be slowly getting some better. Still no leg weakness, numbness, tingling.   Still struggling with constipation. Was able to have a BM Friday with use of 3 doses of miralax and 2 doses of dulcolax. Has been slowly lowering miralax dose as twice now he has had a small liquid BM in his sleep. Has not happened since stopping the dulcolax. No abdominal pain, urinary sxs, N/V. Tolerating PO well.   Past Medical History:  Diagnosis Date  . Colon cancer ([place]) 2000   He states he had a partial colon resection.   . Hyperlipidemia   . PVC (premature ventricular contraction)    Social History   Social History  . Marital status: Married    Spouse name: N/A  . Number of children: N/A  . Years of education: N/A   Occupational History  . Not on file.   Social History Main Topics  . Smoking status: Never Smoker  . Smokeless tobacco: Never Used  . Alcohol use No  . Drug use: No  . Sexual activity: Not on file   Other Topics Concern  . Not on file   Social History Narrative  . No narrative on file   Relevant past medical, surgical, family and social history reviewed and updated as  indicated. Interim medical history since our last visit reviewed. Allergies and medications reviewed and updated.  Review of Systems  Constitutional: Negative.   HENT: Negative.   Respiratory: Negative.   Cardiovascular: Negative.   Gastrointestinal: Positive for constipation.  Genitourinary: Negative.   Musculoskeletal: Positive for back pain and gait problem.  Psychiatric/Behavioral: Negative.    Per HPI unless specifically indicated above     Objective:    BP (!) 172/76   Pulse (!) 57   Temp 98.2 F (36.8 C)   Wt 140 lb (63.5 kg)   SpO2 98%   BMI 20.09 kg/m   Wt Readings from Last 3 Encounters:  01/13/17 140 lb (63.5 kg)  01/11/17 150 lb (68 kg)  01/09/17 140 lb (63.5 kg)    Physical Exam  Constitutional: He is oriented to person, place, and time. He appears well-developed and well-nourished.  HENT:  Head: Atraumatic.  Eyes: Pupils are equal, round, and reactive to light. Conjunctivae are normal.  Neck: Normal range of motion. Neck supple.  Cardiovascular: Normal rate and normal heart sounds.   Pulmonary/Chest: Effort normal and breath sounds normal. No respiratory distress.  Musculoskeletal: Normal range of motion. He exhibits tenderness (mild ttp b/l mid/lower  back). He exhibits no edema or deformity.  Neurological: He is alert and oriented to person, place, and time.  Skin: Skin is warm and dry.  No bruising, abrasions  Psychiatric: He has a normal mood and affect. His behavior is normal.  Nursing note and vitals reviewed.  Results for orders placed or performed during the hospital encounter of 01/11/17  Urinalysis, Complete w Microscopic  Result Value Ref Range   Color, Urine YELLOW (A) YELLOW   APPearance CLEAR (A) CLEAR   Specific Gravity, Urine 1.016 1.005 - 1.030   pH 6.0 5.0 - 8.0   Glucose, UA NEGATIVE NEGATIVE mg/dL   Hgb urine dipstick NEGATIVE NEGATIVE   Bilirubin Urine NEGATIVE NEGATIVE   Ketones, ur NEGATIVE NEGATIVE mg/dL   Protein, ur  NEGATIVE NEGATIVE mg/dL   Nitrite NEGATIVE NEGATIVE   Leukocytes, UA NEGATIVE NEGATIVE   RBC / HPF 0-5 0 - 5 RBC/hpf   WBC, UA 0-5 0 - 5 WBC/hpf   Bacteria, UA NONE SEEN NONE SEEN   Squamous Epithelial / LPF NONE SEEN NONE SEEN   Mucus PRESENT       Assessment & Plan:   Problem List Items Addressed This Visit      Musculoskeletal and Integument   Thoracic compression fracture ([place]) - Primary    Still hurting quite a bit, but continues to have good ROM and functionality. Discussed several options with pt, including referral to specialist for consult. Pt opting to monitor for another week or so using prn percocet, rest, bracing for any activity, lidocaine patches. D/c baclofen as seeming to cause memory issues and is not helping noticeably. Return precautions reviewed again       Other Visit Diagnoses    Drug-induced constipation       Decrease dose of miralax to once-twice daily, only use dulcolax for severe episodes. Prune juice and high fiber diet.        Follow up plan: Return if symptoms worsen or fail to improve.

## 2017-01-13 NOTE — Patient Outreach (Signed)
Outreach patient after ED visit on 01/11/17 at [facility] [place].  I spoke with the patient and he verified his PCP.  He had just come from seeing his PCP.  I explained the 24 Hour Nurse Advice Line and [facility] services and asked if he would like a follow up call from on of my team members.  He declined there being a need for our services at the time and thanked me for the call.  I told him that I would mail him information regarding [facility] services and to give [place] a call should he need [place] in the future.

## 2017-01-13 NOTE — Patient Instructions (Signed)
Follow up as needed

## 2017-01-14 ENCOUNTER — Encounter: Payer: Self-pay | Admitting: Family Medicine

## 2017-01-14 DIAGNOSIS — R700 Elevated erythrocyte sedimentation rate: Secondary | ICD-10-CM | POA: Diagnosis not present

## 2017-01-15 ENCOUNTER — Other Ambulatory Visit: Payer: Self-pay | Admitting: Emergency Medicine

## 2017-01-15 ENCOUNTER — Other Ambulatory Visit: Payer: Self-pay | Admitting: Family Medicine

## 2017-01-15 ENCOUNTER — Encounter: Payer: Self-pay | Admitting: Family Medicine

## 2017-01-15 DIAGNOSIS — S22000D Wedge compression fracture of unspecified thoracic vertebra, subsequent encounter for fracture with routine healing: Secondary | ICD-10-CM

## 2017-01-15 NOTE — Progress Notes (Signed)
re

## 2017-01-16 ENCOUNTER — Telehealth: Payer: Self-pay | Admitting: Family Medicine

## 2017-01-16 ENCOUNTER — Other Ambulatory Visit: Payer: Self-pay | Admitting: Emergency Medicine

## 2017-01-16 NOTE — Telephone Encounter (Signed)
Pt requesting Percocet. Please advise.

## 2017-01-16 NOTE — Telephone Encounter (Signed)
Copied from [place] [phone]. Topic: Quick Communication - See Telephone Encounter >> Jan 16, 2017  2:11 PM Boyd Kerbs wrote: CRM for notification. See Telephone encounter for:  01/16/17.  Need medicine refill - narcotic The prescription she has now is from ER  But came to see Dr. Orene Desanctis and she said he will need medication.  Please call wife- Juliann Pulse

## 2017-01-16 NOTE — Telephone Encounter (Signed)
Pt's wife asking for refill on narcotic.

## 2017-01-17 ENCOUNTER — Other Ambulatory Visit: Payer: Self-pay | Admitting: Family Medicine

## 2017-01-17 MED ORDER — OXYCODONE-ACETAMINOPHEN 5-325 MG PO TABS: 1.0000 | ORAL_TABLET | Freq: Two times a day (BID) | ORAL | 0 refills | Status: DC | PRN

## 2017-01-22 ENCOUNTER — Ambulatory Visit
Admission: RE | Admit: 2017-01-22 | Discharge: 2017-01-22 | Disposition: A | Discharge status: Home / Self Care | Payer: Medicare Other | Source: Ambulatory Visit | Attending: Orthopedic Surgery | Admitting: Orthopedic Surgery

## 2017-01-22 ENCOUNTER — Other Ambulatory Visit: Payer: Self-pay | Admitting: Orthopedic Surgery

## 2017-01-22 DIAGNOSIS — S22080A Wedge compression fracture of T11-T12 vertebra, initial encounter for closed fracture: Secondary | ICD-10-CM

## 2017-01-22 DIAGNOSIS — R29890 Loss of height: Secondary | ICD-10-CM | POA: Insufficient documentation

## 2017-01-22 DIAGNOSIS — M549 Dorsalgia, unspecified: Secondary | ICD-10-CM | POA: Diagnosis not present

## 2017-01-22 DIAGNOSIS — M810 Age-related osteoporosis without current pathological fracture: Secondary | ICD-10-CM | POA: Diagnosis not present

## 2017-01-22 DIAGNOSIS — X58XXXA Exposure to other specified factors, initial encounter: Secondary | ICD-10-CM | POA: Insufficient documentation

## 2017-01-23 ENCOUNTER — Encounter: Payer: Self-pay | Admitting: *Deleted

## 2017-01-23 ENCOUNTER — Ambulatory Visit
Admission: RE | Admit: 2017-01-23 | Discharge: 2017-01-23 | Disposition: A | Discharge status: Home / Self Care | Payer: Medicare Other | Source: Ambulatory Visit | Attending: Orthopedic Surgery | Admitting: Orthopedic Surgery

## 2017-01-23 ENCOUNTER — Ambulatory Visit: Payer: Medicare Other | Admitting: Anesthesiology

## 2017-01-23 ENCOUNTER — Ambulatory Visit: Payer: Medicare Other

## 2017-01-23 ENCOUNTER — Encounter
Admission: RE | Disposition: A | Discharge status: Home / Self Care | Payer: Self-pay | Source: Ambulatory Visit | Attending: Orthopedic Surgery

## 2017-01-23 DIAGNOSIS — Z85038 Personal history of other malignant neoplasm of large intestine: Secondary | ICD-10-CM | POA: Insufficient documentation

## 2017-01-23 DIAGNOSIS — Z419 Encounter for procedure for purposes other than remedying health state, unspecified: Secondary | ICD-10-CM

## 2017-01-23 DIAGNOSIS — I739 Peripheral vascular disease, unspecified: Secondary | ICD-10-CM | POA: Diagnosis not present

## 2017-01-23 DIAGNOSIS — S22080A Wedge compression fracture of T11-T12 vertebra, initial encounter for closed fracture: Secondary | ICD-10-CM | POA: Diagnosis not present

## 2017-01-23 DIAGNOSIS — Z981 Arthrodesis status: Secondary | ICD-10-CM | POA: Diagnosis not present

## 2017-01-23 DIAGNOSIS — Z79899 Other long term (current) drug therapy: Secondary | ICD-10-CM | POA: Insufficient documentation

## 2017-01-23 DIAGNOSIS — W2209XA Striking against other stationary object, initial encounter: Secondary | ICD-10-CM | POA: Insufficient documentation

## 2017-01-23 DIAGNOSIS — E7800 Pure hypercholesterolemia, unspecified: Secondary | ICD-10-CM | POA: Insufficient documentation

## 2017-01-23 HISTORY — PX: KYPHOPLASTY: SHX5884

## 2017-01-23 SURGERY — KYPHOPLASTY
Anesthesia: General | Site: Back | Wound class: Clean

## 2017-01-23 MED ORDER — LIDOCAINE HCL (PF) 1 % IJ SOLN
INTRAMUSCULAR | Status: AC
  Filled 2017-01-23: qty 30

## 2017-01-23 MED ORDER — PROPOFOL 500 MG/50ML IV EMUL
INTRAVENOUS | Status: DC | PRN
  Administered 2017-01-23: 35 ug/kg/min via INTRAVENOUS

## 2017-01-23 MED ORDER — SODIUM CHLORIDE 0.9 % IV SOLN: INTRAVENOUS | Status: DC

## 2017-01-23 MED ORDER — BUPIVACAINE-EPINEPHRINE (PF) 0.5% -1:200000 IJ SOLN
INTRAMUSCULAR | Status: AC
  Filled 2017-01-23: qty 30

## 2017-01-23 MED ORDER — PROPOFOL 10 MG/ML IV BOLUS
INTRAVENOUS | Status: AC
  Filled 2017-01-23: qty 20

## 2017-01-23 MED ORDER — MIDAZOLAM HCL 2 MG/2ML IJ SOLN
INTRAMUSCULAR | Status: AC
  Filled 2017-01-23: qty 2

## 2017-01-23 MED ORDER — FENTANYL CITRATE (PF) 100 MCG/2ML IJ SOLN
INTRAMUSCULAR | Status: DC | PRN
  Administered 2017-01-23 (×4): 25 ug via INTRAVENOUS

## 2017-01-23 MED ORDER — ONDANSETRON HCL 4 MG PO TABS: 4.0000 mg | ORAL_TABLET | Freq: Four times a day (QID) | ORAL | Status: DC | PRN

## 2017-01-23 MED ORDER — IOPAMIDOL (ISOVUE-M 200) INJECTION 41%
INTRAMUSCULAR | Status: AC
  Filled 2017-01-23: qty 20

## 2017-01-23 MED ORDER — FAMOTIDINE 20 MG PO TABS
20.0000 mg | ORAL_TABLET | Freq: Once | ORAL | Status: AC
  Administered 2017-01-23: 20 mg via ORAL

## 2017-01-23 MED ORDER — HYDROCODONE-ACETAMINOPHEN 5-325 MG PO TABS: 1.0000 | ORAL_TABLET | ORAL | Status: DC | PRN

## 2017-01-23 MED ORDER — LIDOCAINE HCL 1 % IJ SOLN
INTRAMUSCULAR | Status: DC | PRN
  Administered 2017-01-23: 30 mL

## 2017-01-23 MED ORDER — METOCLOPRAMIDE HCL 10 MG PO TABS: 5.0000 mg | ORAL_TABLET | Freq: Three times a day (TID) | ORAL | Status: DC | PRN

## 2017-01-23 MED ORDER — CEFAZOLIN SODIUM-DEXTROSE 1-4 GM/50ML-% IV SOLN
1.0000 g | Freq: Once | INTRAVENOUS | Status: AC
  Administered 2017-01-23: 1 g via INTRAVENOUS

## 2017-01-23 MED ORDER — FENTANYL CITRATE (PF) 100 MCG/2ML IJ SOLN
INTRAMUSCULAR | Status: AC
  Filled 2017-01-23: qty 2

## 2017-01-23 MED ORDER — LIDOCAINE 2% (20 MG/ML) 5 ML SYRINGE
INTRAMUSCULAR | Status: DC | PRN
  Administered 2017-01-23: 25 mg via INTRAVENOUS

## 2017-01-23 MED ORDER — BUPIVACAINE-EPINEPHRINE (PF) 0.5% -1:200000 IJ SOLN
INTRAMUSCULAR | Status: DC | PRN
  Administered 2017-01-23: 20 mL

## 2017-01-23 MED ORDER — METOCLOPRAMIDE HCL 5 MG/ML IJ SOLN: 5.0000 mg | Freq: Three times a day (TID) | INTRAMUSCULAR | Status: DC | PRN

## 2017-01-23 MED ORDER — ONDANSETRON HCL 4 MG/2ML IJ SOLN: 4.0000 mg | Freq: Once | INTRAMUSCULAR | Status: DC | PRN

## 2017-01-23 MED ORDER — CEFAZOLIN SODIUM-DEXTROSE 1-4 GM/50ML-% IV SOLN
INTRAVENOUS | Status: AC
  Filled 2017-01-23: qty 50

## 2017-01-23 MED ORDER — PROPOFOL 10 MG/ML IV BOLUS
INTRAVENOUS | Status: DC | PRN
  Administered 2017-01-23: 20 mg via INTRAVENOUS

## 2017-01-23 MED ORDER — LACTATED RINGERS IV SOLN
INTRAVENOUS | Status: DC
  Administered 2017-01-23: 11:00:00 via INTRAVENOUS

## 2017-01-23 MED ORDER — MIDAZOLAM HCL 5 MG/5ML IJ SOLN
INTRAMUSCULAR | Status: DC | PRN
  Administered 2017-01-23: 2 mg via INTRAVENOUS

## 2017-01-23 MED ORDER — ONDANSETRON HCL 4 MG/2ML IJ SOLN: 4.0000 mg | Freq: Four times a day (QID) | INTRAMUSCULAR | Status: DC | PRN

## 2017-01-23 MED ORDER — LIDOCAINE HCL (PF) 2 % IJ SOLN
INTRAMUSCULAR | Status: AC
  Filled 2017-01-23: qty 10

## 2017-01-23 MED ORDER — FAMOTIDINE 20 MG PO TABS
ORAL_TABLET | ORAL | Status: AC
  Administered 2017-01-23: 20 mg via ORAL
  Filled 2017-01-23: qty 1

## 2017-01-23 MED ORDER — FENTANYL CITRATE (PF) 100 MCG/2ML IJ SOLN: 25.0000 ug | INTRAMUSCULAR | Status: DC | PRN

## 2017-01-23 MED ORDER — IOPAMIDOL (ISOVUE-M 200) INJECTION 41%
INTRAMUSCULAR | Status: AC
  Filled 2017-01-23: qty 10

## 2017-01-23 SURGICAL SUPPLY — 16 items
CEMENT KYPHON CX01A KIT/MIXER (Cement) ×2 IMPLANT
DERMABOND ADVANCED (GAUZE/BANDAGES/DRESSINGS) ×1
DERMABOND ADVANCED .7 DNX12 (GAUZE/BANDAGES/DRESSINGS) ×1 IMPLANT
DEVICE BIOPSY BONE KYPHX (INSTRUMENTS) ×2 IMPLANT
DRAPE C-ARM XRAY 36X54 (DRAPES) ×2 IMPLANT
DURAPREP 26ML APPLICATOR (WOUND CARE) ×2 IMPLANT
GLOVE SURG SYN 9.0  PF PI (GLOVE) ×1
GLOVE SURG SYN 9.0 PF PI (GLOVE) ×1 IMPLANT
GOWN SRG 2XL LVL 4 RGLN SLV (GOWNS) ×1 IMPLANT
GOWN STRL NON-REIN 2XL LVL4 (GOWNS) ×1
GOWN STRL REUS W/ TWL LRG LVL3 (GOWN DISPOSABLE) ×1 IMPLANT
GOWN STRL REUS W/TWL LRG LVL3 (GOWN DISPOSABLE) ×1
PACK KYPHOPLASTY (MISCELLANEOUS) ×2 IMPLANT
STRAP SAFETY BODY (MISCELLANEOUS) ×2 IMPLANT
TRAY KYPHOPAK 15/3 EXPRESS 1ST (MISCELLANEOUS) IMPLANT
TRAY KYPHOPAK 20/3 EXPRESS 1ST (MISCELLANEOUS) ×2 IMPLANT

## 2017-01-23 NOTE — Anesthesia Preprocedure Evaluation (Signed)
Anesthesia Evaluation  Patient identified by MRN, date of birth, ID band Patient awake    Reviewed: Allergy & Precautions, H&P , NPO status , Patient's Chart, lab work & pertinent test results, reviewed documented beta blocker date and time   Airway Mallampati: II   Neck ROM: full    Dental  (+) Poor Dentition   Pulmonary neg pulmonary ROS,    Pulmonary exam normal        Cardiovascular + Peripheral Vascular Disease  negative cardio ROS Normal cardiovascular exam Rhythm:regular Rate:Normal     Neuro/Psych negative neurological ROS  negative psych ROS   GI/Hepatic negative GI ROS, Neg liver ROS,   Endo/Other  negative endocrine ROS  Renal/GU negative Renal ROS  negative genitourinary   Musculoskeletal   Abdominal   Peds  Hematology negative hematology ROS (+)   Anesthesia Other Findings Past Medical History: 2000: Colon cancer ([place])     Comment:  He states he had a partial colon resection.  No date: Hyperlipidemia No date: PVC (premature ventricular contraction) Past Surgical History: No date: COLON SURGERY BMI    Body Mass Index:  20.09 kg/m     Reproductive/Obstetrics negative OB ROS                             Anesthesia Physical Anesthesia Plan  ASA: II  Anesthesia Plan: General   Post-op Pain Management:    Induction:   PONV Risk Score and Plan: 3 and Ondansetron, Dexamethasone, Midazolam and Propofol infusion  Airway Management Planned:   Additional Equipment:   Intra-op Plan:   Post-operative Plan:   Informed Consent: I have reviewed the patients History and Physical, chart, labs and discussed the procedure including the risks, benefits and alternatives for the proposed anesthesia with the patient or authorized representative who has indicated his/her understanding and acceptance.   Dental Advisory Given  Plan Discussed with: CRNA  Anesthesia Plan  Comments:         Anesthesia Quick Evaluation

## 2017-01-23 NOTE — Transfer of Care (Signed)
Immediate Anesthesia Transfer of Care Note  Patient: INFANT ZINK  Procedure(s) Performed: Coralyn Helling (N/A Back)  Patient Location: PACU  Anesthesia Type:General  Level of Consciousness: awake and alert   Airway & Oxygen Therapy: Patient Spontanous Breathing  Post-op Assessment: Report given to RN and Post -op Vital signs reviewed and stable  Post vital signs: Reviewed  Last Vitals:  Vitals:   01/23/17 1119 01/23/17 1354  BP: (!) 176/77 (!) 164/78  Pulse: 72 83  Resp: 18 14  Temp: 36.6 C   SpO2: 100% 99%    Last Pain:  Vitals:   01/23/17 1119  TempSrc: Oral  PainSc: 7          Complications: No apparent anesthesia complications

## 2017-01-23 NOTE — Discharge Instructions (Addendum)
Take it easy today and resume normal household activities tomorrow. Okay to drive on Monday if off of narcotics. Remove Band-Aid on Saturday then okay to shower. Try to avoid lifting taking anything over 3 pounds for 2 weeks    AMBULATORY SURGERY  DISCHARGE INSTRUCTIONS   1) The drugs that you were given will stay in your system until tomorrow so for the next 24 hours you should not:  A) Drive an automobile B) Make any legal decisions C) Drink any alcoholic beverage   2) You may resume regular meals tomorrow.  Today it is better to start with liquids and gradually work up to solid foods.  You may eat anything you prefer, but it is better to start with liquids, then soup and crackers, and gradually work up to solid foods.   3) Please notify your doctor immediately if you have any unusual bleeding, trouble breathing, redness and pain at the surgery site, drainage, fever, or pain not relieved by medication.    4) Additional Instructions:        Please contact your physician with any problems or Same Day Surgery at [phone], Monday through Friday 6 am to 4 pm, or  at [facility] number at [phone].

## 2017-01-23 NOTE — OR Nursing (Signed)
Dr. Rudene Christians in to see pt prior to d/c

## 2017-01-23 NOTE — Op Note (Signed)
01/23/2017  1:51 PM  PATIENT:  Scott Guzman  76 y.o. male  PRE-OPERATIVE DIAGNOSIS:  CLOSED WEDGE COMPRESSION FRACTURE OF TWELFTH THORACIC VERTEBRA  POST-OPERATIVE DIAGNOSIS:  CLOSED WEDGE COMPRESSION FRACTURE OF TWELFTH THORACIC VERTEBRA  PROCEDURE:  Procedure(s): KYPHOPLASTY-T12 (N/A)  SURGEON: Laurene Footman, MD  ASSISTANTS: None  ANESTHESIA:   local and MAC  EBL:  Total I/O In: 250 [I.V.:250] Out: 0   BLOOD ADMINISTERED:none  DRAINS: none   LOCAL MEDICATIONS USED:  MARCAINE    and XYLOCAINE   SPECIMEN:  Source of Specimen:  T12 vertebral body  DISPOSITION OF SPECIMEN:  PATHOLOGY  COUNTS:  YES  TOURNIQUET:  * No tourniquets in log *  IMPLANTS: Bone cement  DICTATION: .Dragon Dictation   Patient brought the operating room and after adequate sedation was obtained the patient was placed prone and C-arm brought in with good visualization of T12on the C-arm. After appropriate patient identification and timeout procedure local anesthetic was infiltratedon both sides at T12. The back was then prepped and draped in sterile fashion and repeat timeout procedure carried out. Spinal needle was used to get local asthenic down to the pedicle on the right and left at T12. Small incision was then made and trocar advanced into the vertebral body and an  transpedicular fashion taking frequent C-arm views to make sure the neural foramen and spinal canal were not entered. Specimen was obtained during biopsy part of the procedure followed by drilling carried out followed by placement of balloon inflation ofT12balloon to 4.0cc, identical procedure without the biopsy done on the left side and inflation to 3 cc with good correction of deformity . Next the cement was mixed and was appropriate consistency it was used to fill the vertebral bodies with about 7cc into T 12 with good fill and interdigitation. After the cement was set the trochar wasremoved and permanent C-arm views showed  adequate position of the cement with good fill superior to inferior endplates medial and lateral. The wounds are closed with Dermabond followed by Band-Aids  PLAN OF CARE: Discharge to home after PACU  PATIENT DISPOSITION:  PACU - hemodynamically stable.

## 2017-01-23 NOTE — Anesthesia Post-op Follow-up Note (Signed)
Anesthesia QCDR form completed.        

## 2017-01-23 NOTE — H&P (Signed)
Reviewed paper H+P, will be scanned into chart. No changes noted.  

## 2017-01-24 ENCOUNTER — Encounter: Payer: Self-pay | Admitting: Orthopedic Surgery

## 2017-01-24 LAB — SURGICAL PATHOLOGY

## 2017-01-27 ENCOUNTER — Ambulatory Visit: Payer: Medicare Other | Admitting: Family Medicine

## 2017-01-27 ENCOUNTER — Encounter: Payer: Self-pay | Admitting: Family Medicine

## 2017-01-27 VITALS — BP 143/83 | HR 78 | Temp 97.8°F | Wt 129.0 lb

## 2017-01-27 DIAGNOSIS — R5382 Chronic fatigue, unspecified: Secondary | ICD-10-CM | POA: Diagnosis not present

## 2017-01-27 DIAGNOSIS — S22000D Wedge compression fracture of unspecified thoracic vertebra, subsequent encounter for fracture with routine healing: Secondary | ICD-10-CM

## 2017-01-27 DIAGNOSIS — R627 Adult failure to thrive: Secondary | ICD-10-CM

## 2017-01-27 MED ORDER — MEGESTROL ACETATE 40 MG PO TABS: 40.0000 mg | ORAL_TABLET | Freq: Three times a day (TID) | ORAL | 1 refills | Status: DC | PRN

## 2017-01-27 NOTE — Anesthesia Postprocedure Evaluation (Signed)
Anesthesia Post Note  Patient: MARLEN MOLLICA  Procedure(s) Performed: Coralyn Helling (N/A Back)  Patient location during evaluation: PACU Anesthesia Type: General Level of consciousness: awake and alert Pain management: pain level controlled Vital Signs Assessment: post-procedure vital signs reviewed and stable Respiratory status: spontaneous breathing, nonlabored ventilation, respiratory function stable and patient connected to nasal cannula oxygen Cardiovascular status: blood pressure returned to baseline and stable Postop Assessment: no apparent nausea or vomiting Anesthetic complications: no     Last Vitals:  Vitals:   01/23/17 1436 01/23/17 1455  BP: (!) 171/89 (!) 166/88  Pulse: 71 73  Resp: 16 16  Temp: 36.6 C (!) 36.4 C  SpO2: 100% 100%    Last Pain:  Vitals:   01/24/17 0840  TempSrc:   PainSc: 1                  Molli Barrows

## 2017-01-27 NOTE — Progress Notes (Signed)
BP (!) 143/83   Pulse 78   Temp 97.8 F (36.6 C)   Wt 129 lb (58.5 kg)   SpO2 100%   BMI 18.51 kg/m    Subjective:    Patient ID: Scott Guzman, male    DOB: July 10, 1940, 76 y.o.   MRN: [id number]  HPI: EADEN HETTINGER is a 76 y.o. male  Chief Complaint  Patient presents with  . Weight Loss    Has lost 11 pounds in the last 2 weeks, back in April he was 150. Also just overall feeling bad. Feels like he's going back to way he was a year ago when he sick.   Patient presents today for malaise, weight loss, and anxiousness the past month or so since a back injury. Taking about 1 pain pill daily, had elective kyphoplasty several days ago. Doing well for the most part, getting stronger. Pt states he does not do well sitting home all day and feels like this is very much affecting his mental health. Not having any appetite anymore, only eating very small portions every once in a while. Has lost a significant amount of weight recently, which he mainly attributes to stress and his poor appetite. Denies fevers, chills, CP, SOB, N/V, melena.   Also wanting to discuss a second opinion through another rheumatologist. Wanting a new referral today.   Past Medical History:  Diagnosis Date  . Colon cancer ([place]) 2000   He states he had a partial colon resection.   . Hyperlipidemia   . PVC (premature ventricular contraction)    Social History   Socioeconomic History  . Marital status: Married    Spouse name: Not on file  . Number of children: Not on file  . Years of education: Not on file  . Highest education level: Not on file  Social Needs  . Financial resource strain: Patient refused  . Food insecurity - worry: Patient refused  . Food insecurity - inability: Patient refused  . Transportation needs - medical: No  . Transportation needs - non-medical: No  Occupational History  . Not on file  Tobacco Use  . Smoking status: Never Smoker  . Smokeless tobacco: Never Used  Substance and  Sexual Activity  . Alcohol use: No    Alcohol/week: 0.0 oz  . Drug use: No  . Sexual activity: Not on file  Other Topics Concern  . Not on file  Social History Narrative  . Not on file   Relevant past medical, surgical, family and social history reviewed and updated as indicated. Interim medical history since our last visit reviewed. Allergies and medications reviewed and updated.  Review of Systems  Constitutional: Positive for appetite change, fatigue and unexpected weight change.  HENT: Negative.   Respiratory: Negative.   Cardiovascular: Negative.   Gastrointestinal: Negative.   Genitourinary: Negative.   Musculoskeletal: Positive for back pain.  Skin: Negative.   Neurological: Negative.   Psychiatric/Behavioral: Positive for dysphoric mood. The patient is nervous/anxious.    Per HPI unless specifically indicated above     Objective:    BP (!) 143/83   Pulse 78   Temp 97.8 F (36.6 C)   Wt 129 lb (58.5 kg)   SpO2 100%   BMI 18.51 kg/m   Wt Readings from Last 3 Encounters:  01/27/17 129 lb (58.5 kg)  01/23/17 140 lb (63.5 kg)  01/13/17 140 lb (63.5 kg)    Physical Exam  Constitutional: He is oriented to person, place, and time.  Significantly underweight  HENT:  Head: Atraumatic.  Eyes: Conjunctivae are normal. Pupils are equal, round, and reactive to light. No scleral icterus.  Neck: Normal range of motion. Neck supple.  Cardiovascular: Normal rate and normal heart sounds.  Pulmonary/Chest: Effort normal and breath sounds normal. No respiratory distress.  Musculoskeletal: Normal range of motion.  Neurological: He is alert and oriented to person, place, and time.  Skin: Skin is warm and dry.  Psychiatric: He has a normal mood and affect. His behavior is normal.  Nursing note and vitals reviewed.     Assessment & Plan:   Problem List Items Addressed This Visit      Musculoskeletal and Integument   Thoracic compression fracture [facility])    S/p  kyphoplasty, pain under good control and pt states slowly improving. Continue rest, pain medication prn        Other   Fatigue - Primary    Will refer to a different Rheumatologist per pt request for second opinion given his chronic (largely unexplained) symptoms      Relevant Orders   Ambulatory referral to Rheumatology    Other Visit Diagnoses    Adult failure to thrive        Suspect much of his weight loss and malaise is from depression and anxiety, wife in very poor health and now pt with back injury unable to work. Recommended counseling, which pt declines. Will start megace and boost supplements at least twice daily. Increase PO intake. Slowly re-condition once cleared by Neurosurgery. Will continue to monitor closely.     Follow up plan: Return for as scheduled.

## 2017-01-29 NOTE — Assessment & Plan Note (Signed)
Will refer to a different Rheumatologist per pt request for second opinion given his chronic (largely unexplained) symptoms

## 2017-01-29 NOTE — Patient Instructions (Signed)
Follow up as needed

## 2017-01-29 NOTE — Assessment & Plan Note (Signed)
S/p kyphoplasty, pain under good control and pt states slowly improving. Continue rest, pain medication prn

## 2017-01-30 DIAGNOSIS — H353132 Nonexudative age-related macular degeneration, bilateral, intermediate dry stage: Secondary | ICD-10-CM | POA: Diagnosis not present

## 2017-01-30 DIAGNOSIS — H25013 Cortical age-related cataract, bilateral: Secondary | ICD-10-CM | POA: Diagnosis not present

## 2017-01-30 DIAGNOSIS — H2511 Age-related nuclear cataract, right eye: Secondary | ICD-10-CM | POA: Diagnosis not present

## 2017-01-30 DIAGNOSIS — H2513 Age-related nuclear cataract, bilateral: Secondary | ICD-10-CM | POA: Diagnosis not present

## 2017-01-30 DIAGNOSIS — H43393 Other vitreous opacities, bilateral: Secondary | ICD-10-CM | POA: Diagnosis not present

## 2017-02-01 ENCOUNTER — Other Ambulatory Visit: Payer: Self-pay

## 2017-02-01 ENCOUNTER — Emergency Department
Admission: EM | Admit: 2017-02-01 | Discharge: 2017-02-01 | Disposition: A | Discharge status: Home / Self Care | Payer: Medicare Other | Attending: Emergency Medicine | Admitting: Emergency Medicine

## 2017-02-01 ENCOUNTER — Encounter: Payer: Self-pay | Admitting: Emergency Medicine

## 2017-02-01 ENCOUNTER — Emergency Department: Payer: Medicare Other

## 2017-02-01 DIAGNOSIS — H109 Unspecified conjunctivitis: Secondary | ICD-10-CM | POA: Diagnosis not present

## 2017-02-01 DIAGNOSIS — R531 Weakness: Secondary | ICD-10-CM | POA: Diagnosis not present

## 2017-02-01 DIAGNOSIS — R634 Abnormal weight loss: Secondary | ICD-10-CM | POA: Insufficient documentation

## 2017-02-01 DIAGNOSIS — Z85038 Personal history of other malignant neoplasm of large intestine: Secondary | ICD-10-CM | POA: Diagnosis not present

## 2017-02-01 DIAGNOSIS — H5789 Other specified disorders of eye and adnexa: Secondary | ICD-10-CM | POA: Diagnosis present

## 2017-02-01 DIAGNOSIS — H1031 Unspecified acute conjunctivitis, right eye: Secondary | ICD-10-CM | POA: Insufficient documentation

## 2017-02-01 DIAGNOSIS — R5383 Other fatigue: Secondary | ICD-10-CM | POA: Diagnosis not present

## 2017-02-01 DIAGNOSIS — Z79899 Other long term (current) drug therapy: Secondary | ICD-10-CM | POA: Insufficient documentation

## 2017-02-01 LAB — CBC WITH DIFFERENTIAL/PLATELET
BASOS ABS: 0 10*3/uL (ref 0–0.1)
Basophils Relative: 0 %
EOS ABS: 0.1 10*3/uL (ref 0–0.7)
EOS PCT: 1 %
HCT: 38.6 % — ABNORMAL LOW (ref 40.0–52.0)
HEMOGLOBIN: 12.8 g/dL — AB (ref 13.0–18.0)
LYMPHS PCT: 15 %
Lymphs Abs: 1 10*3/uL (ref 1.0–3.6)
MCH: 28.4 pg (ref 26.0–34.0)
MCHC: 33.1 g/dL (ref 32.0–36.0)
MCV: 85.7 fL (ref 80.0–100.0)
Monocytes Absolute: 0.6 10*3/uL (ref 0.2–1.0)
Monocytes Relative: 9 %
NEUTROS PCT: 75 %
Neutro Abs: 5 10*3/uL (ref 1.4–6.5)
PLATELETS: 281 10*3/uL (ref 150–440)
RBC: 4.5 MIL/uL (ref 4.40–5.90)
RDW: 14.5 % (ref 11.5–14.5)
WBC: 6.7 10*3/uL (ref 3.8–10.6)

## 2017-02-01 LAB — COMPREHENSIVE METABOLIC PANEL
ALT: 14 U/L — AB (ref 17–63)
ANION GAP: 10 (ref 5–15)
AST: 25 U/L (ref 15–41)
Albumin: 3.8 g/dL (ref 3.5–5.0)
Alkaline Phosphatase: 99 U/L (ref 38–126)
BUN: 30 mg/dL — ABNORMAL HIGH (ref 6–20)
CALCIUM: 9 mg/dL (ref 8.9–10.3)
CHLORIDE: 103 mmol/L (ref 101–111)
CO2: 23 mmol/L (ref 22–32)
CREATININE: 1.14 mg/dL (ref 0.61–1.24)
Glucose, Bld: 109 mg/dL — ABNORMAL HIGH (ref 65–99)
Potassium: 3.9 mmol/L (ref 3.5–5.1)
SODIUM: 136 mmol/L (ref 135–145)
Total Bilirubin: 1.1 mg/dL (ref 0.3–1.2)
Total Protein: 6.7 g/dL (ref 6.5–8.1)

## 2017-02-01 LAB — TROPONIN I

## 2017-02-01 LAB — C-REACTIVE PROTEIN: CRP: 1.4 mg/dL — AB (ref <1.0)

## 2017-02-01 LAB — SEDIMENTATION RATE: Sed Rate: 27 mm/hr — ABNORMAL HIGH (ref 0–20)

## 2017-02-01 LAB — CK: CK TOTAL: 100 U/L (ref 49–397)

## 2017-02-01 MED ORDER — EYE WASH OPHTH SOLN
2.0000 [drp] | OPHTHALMIC | Status: DC | PRN
  Filled 2017-02-01: qty 118

## 2017-02-01 MED ORDER — SULFACETAMIDE SODIUM 10 % OP SOLN: 2.0000 [drp] | Freq: Four times a day (QID) | OPHTHALMIC | 0 refills | Status: DC

## 2017-02-01 MED ORDER — TETRACAINE HCL 0.5 % OP SOLN
2.0000 [drp] | Freq: Once | OPHTHALMIC | Status: AC
  Administered 2017-02-01: 2 [drp] via OPHTHALMIC

## 2017-02-01 MED ORDER — FLUORESCEIN SODIUM 1 MG OP STRP
ORAL_STRIP | OPHTHALMIC | Status: AC
  Administered 2017-02-01: 1 via OPHTHALMIC
  Filled 2017-02-01: qty 1

## 2017-02-01 MED ORDER — TETRACAINE HCL 0.5 % OP SOLN
OPHTHALMIC | Status: AC
  Administered 2017-02-01: 2 [drp] via OPHTHALMIC
  Filled 2017-02-01: qty 4

## 2017-02-01 MED ORDER — FLUORESCEIN SODIUM 1 MG OP STRP
1.0000 | ORAL_STRIP | Freq: Once | OPHTHALMIC | Status: AC
  Administered 2017-02-01: 1 via OPHTHALMIC

## 2017-02-01 NOTE — Discharge Instructions (Signed)
Please follow-up with both your eye doctor and Dr. Jefm Bryant. Use the sulfa eyedrops one or 2 to the eye every 4 hours while you're awake. Please return here if you worsen at all including worse weakness more changes to the eyes or any other problems.

## 2017-02-01 NOTE — ED Provider Notes (Signed)
[facility] Emergency Department Provider Note   ____________________________________________   First MD Initiated Contact with Patient 02/01/17 [phone]     (approximate)  I have reviewed the triage vital signs and the nursing notes.   HISTORY  Chief Complaint Weight Loss and Eye Problem    HPI Scott Guzman is a 76 y.o. male Patient reports he had kyphoplasty about a week ago and pain is still bothersome although it's better than it was. He went to see the eye doctor several days ago and about 2 days later began having redness in the eye he thinks his eye vision is somewhat blurry in that eye although when we tested it with the pocket card division was the same in both eyes actually slightly better than the right eye which is effected eye. Patient reports he's feeling weak and has been losing weight again he has had problems like this in the past. He has been followed by Dr. Virgilio Belling who was treating with steroids for an elevated sedimentation rate. At present his sedimentation rate is not elevated. He denies any focal weakness or numbness in his wife reports she is not having any droopy face.   Past Medical History:  Diagnosis Date  . Colon cancer ([place]) 2000   He states he had a partial colon resection.   . Hyperlipidemia   . PVC (premature ventricular contraction)     Patient Active Problem List   Diagnosis Date Noted  . Thoracic compression fracture ([place]) 01/13/2017  . Palpitations 06/25/2016  . Fatigue 06/25/2016  . Temporal arteritis ([place]) 03/12/2016  . BPH (benign prostatic hyperplasia) 03/12/2016  . Insomnia 10/31/2014  . Hypercholesterolemia 10/13/2014  . Colon cancer ([place]) 10/13/2014    Past Surgical History:  Procedure Laterality Date  . COLON SURGERY      Prior to Admission medications   Medication Sig Start Date End Date Taking? Authorizing Provider  Baclofen 5 MG TABS Take 5 mg by mouth 3 (three) times daily as needed. Patient not  taking: Reported on 01/27/2017 01/07/17   Volney American, PA-C  meclizine (ANTIVERT) 25 MG tablet Take 1 tablet (25 mg total) by mouth 3 (three) times daily as needed for dizziness. Patient not taking: Reported on 01/07/2017 06/19/16   Volney American, PA-C  megestrol (MEGACE) 40 MG tablet Take 1 tablet (40 mg total) 3 (three) times daily as needed by mouth. 01/27/17   Volney American, PA-C  mirtazapine (REMERON) 30 MG tablet Take 1 tablet (30 mg total) by mouth at bedtime. 12/12/16   Volney American, PA-C  oxyCODONE-acetaminophen (PERCOCET) 5-325 MG tablet Take 1 tablet by mouth 2 (two) times daily as needed for severe pain. 01/17/17   Volney American, PA-C  predniSONE (DELTASONE) 20 MG tablet Take 7.5 mg by mouth daily.  02/25/16   [provider]    Allergies Patient has no known allergies.  No family history on file.  Social History Social History   Tobacco Use  . Smoking status: Never Smoker  . Smokeless tobacco: Never Used  Substance Use Topics  . Alcohol use: No    Alcohol/week: 0.0 oz  . Drug use: No    Review of Systems  Constitutional: No fever/chills Eyes: No visual changes. ENT: No sore throat. Cardiovascular: Denies chest pain. Respiratory: Denies shortness of breath. Gastrointestinal: No abdominal pain.  No nausea, no vomiting.  No diarrhea.  No constipation. Genitourinary: Negative for dysuria. Musculoskeletal: see history of present illness Skin: Negative for  rash. Neurological: Negative for headaches, focal weakness   ____________________________________________   PHYSICAL EXAM:  VITAL SIGNS: ED Triage Vitals  Enc Vitals Group     BP 02/01/17 0908 134/73     Pulse Rate 02/01/17 0908 83     Resp 02/01/17 0908 20     Temp 02/01/17 0908 98.2 F (36.8 C)     Temp Source 02/01/17 0908 Oral     SpO2 02/01/17 0908 100 %     Weight 02/01/17 0909 130 lb (59 kg)     Height 02/01/17 0909 5\' 10"  (1.778 m)     Head  Circumference --      Peak Flow --      Pain Score 02/01/17 0911 5     Pain Loc --      Pain Edu? --    Constitutional: Alert and oriented. Well appearing and in no acute distress. Eyes: right eye has injected injected conjunctiva. Fundus appears normal is no pain with bright light shining in the eye extraocular movements are intact motor round and reactive visual acuity as described in history of present illness Head: Atraumatic. Nose: No congestion/rhinnorhea. Mouth/Throat: Mucous membranes are moist.  Oropharynx non-erythematous. Neck: No stridor.  Cardiovascular: Normal rate, regular rhythm. Grossly normal heart sounds.  Good peripheral circulation. Respiratory: Normal respiratory effort.  No retractions. Lungs CTAB. Gastrointestinal: Soft and nontender. No distention. No abdominal bruits. No CVA tenderness. Musculoskeletal: No lower extremity tenderness nor edema.  No joint effusions. Neurologic:  Normal speech and language. No gross focal neurologic deficits are appreciated.there is a little bit of right sided facial droop but again family reports that he looks normal especially when I asked about his face. Skin:  Skin is warm, dry and intact. No rash noted. Psychiatric: Mood and affect are normal. Speech and behavior are normal.  ____________________________________________   LABS (all labs ordered are listed, but only abnormal results are displayed)  Labs Reviewed  COMPREHENSIVE METABOLIC PANEL - Abnormal; Notable for the following components:      Result Value   Glucose, Bld 109 (*)    BUN 30 (*)    ALT 14 (*)    All other components within normal limits  CBC WITH DIFFERENTIAL/PLATELET - Abnormal; Notable for the following components:   Hemoglobin 12.8 (*)    HCT 38.6 (*)    All other components within normal limits  SEDIMENTATION RATE - Abnormal; Notable for the following components:   Sed Rate 27 (*)    All other components within normal limits  TROPONIN I  CK    URINALYSIS, COMPLETE (UACMP) WITH MICROSCOPIC  C-REACTIVE PROTEIN   ____________________________________________  EKG  ____________________________________________  RADIOLOGY  Dg Chest 2 View  Result Date: 02/01/2017 CLINICAL DATA:  Weight loss and fatigue EXAM: CHEST  2 VIEW COMPARISON:  Chest radiograph May 04, 2016 and chest CT May 04, 2016 FINDINGS: Lungs are mildly hyperexpanded. No edema or consolidation. Heart size and pulmonary vascularity are normal. No adenopathy. There is aortic atherosclerosis. There is calcification in each carotid artery. There is a wedge fracture at T12, status post kyphoplasty. IMPRESSION: Lungs hyperexpanded without edema or consolidation. Cardiac silhouette within normal limits. There is aortic atherosclerosis as well as calcification in each carotid artery. Aortic Atherosclerosis (ICD10-I70.0). Electronically Signed   By: Lowella Grip III M.D.   On: 02/01/2017 10:51    ____________________________________________   PROCEDURES  Procedure(s) performed:   Procedures  Critical Care performed:  ____________________________________________   INITIAL IMPRESSION / ASSESSMENT AND PLAN /  ED COURSE  As part of my medical decision making, I reviewed the following data within the [place] records were reviewed including records from Dr. Sarajane Jews visit's. I also discussed the patient with Dr. Gwenlyn Saran ophthalmology on-call. We will treat the patient's red eye with topical antibiotic drops and he will follow-up with the eye doctor this coming week. He will return if he is worse. He will also follow with Dr. Milinda Antis who has been treating him for his problem.  ____________________________________________   FINAL CLINICAL IMPRESSION(S) / ED DIAGNOSES  Final diagnoses:  Conjunctivitis of right eye, unspecified conjunctivitis type  Weakness     ED Discharge Orders    None       Note:  This document was prepared using  Dragon voice recognition software and may include unintentional dictation errors.    Nena Polio, MD 02/01/17 [phone]

## 2017-02-01 NOTE — ED Notes (Signed)
During triage patient also mentions concerns regarding pain back since procedure one week ago and redness R eye since yesterday.

## 2017-02-01 NOTE — ED Triage Notes (Signed)
States has had weight loss of 20 pounds in last 2 months and fatigue x 2 months.

## 2017-02-01 NOTE — ED Notes (Signed)
Pt took oxycodone 5mg  last night. No pain medications taken prior to arrival.

## 2017-02-01 NOTE — ED Notes (Signed)
Pt transported to XR.  

## 2017-02-03 DIAGNOSIS — M316 Other giant cell arteritis: Secondary | ICD-10-CM | POA: Diagnosis not present

## 2017-02-03 DIAGNOSIS — H11421 Conjunctival edema, right eye: Secondary | ICD-10-CM | POA: Diagnosis not present

## 2017-02-03 DIAGNOSIS — E538 Deficiency of other specified B group vitamins: Secondary | ICD-10-CM | POA: Diagnosis not present

## 2017-02-03 DIAGNOSIS — R700 Elevated erythrocyte sedimentation rate: Secondary | ICD-10-CM | POA: Diagnosis not present

## 2017-02-03 DIAGNOSIS — M818 Other osteoporosis without current pathological fracture: Secondary | ICD-10-CM | POA: Diagnosis not present

## 2017-02-03 DIAGNOSIS — M8588 Other specified disorders of bone density and structure, other site: Secondary | ICD-10-CM | POA: Diagnosis not present

## 2017-02-03 DIAGNOSIS — H5711 Ocular pain, right eye: Secondary | ICD-10-CM | POA: Diagnosis not present

## 2017-02-06 DIAGNOSIS — K5909 Other constipation: Secondary | ICD-10-CM | POA: Diagnosis not present

## 2017-02-06 DIAGNOSIS — Z85038 Personal history of other malignant neoplasm of large intestine: Secondary | ICD-10-CM | POA: Diagnosis not present

## 2017-02-06 DIAGNOSIS — H5712 Ocular pain, left eye: Secondary | ICD-10-CM | POA: Diagnosis not present

## 2017-02-06 DIAGNOSIS — M316 Other giant cell arteritis: Secondary | ICD-10-CM | POA: Diagnosis not present

## 2017-02-06 DIAGNOSIS — H11421 Conjunctival edema, right eye: Secondary | ICD-10-CM | POA: Diagnosis not present

## 2017-02-06 DIAGNOSIS — H2510 Age-related nuclear cataract, unspecified eye: Secondary | ICD-10-CM | POA: Diagnosis not present

## 2017-02-07 ENCOUNTER — Ambulatory Visit: Payer: Medicare Other | Admitting: Family Medicine

## 2017-02-07 ENCOUNTER — Encounter: Payer: Self-pay | Admitting: Family Medicine

## 2017-02-07 VITALS — BP 121/81 | HR 80 | Temp 97.5°F | Wt 130.2 lb

## 2017-02-07 DIAGNOSIS — R350 Frequency of micturition: Secondary | ICD-10-CM

## 2017-02-07 DIAGNOSIS — Z85038 Personal history of other malignant neoplasm of large intestine: Secondary | ICD-10-CM | POA: Insufficient documentation

## 2017-02-07 DIAGNOSIS — S22080A Wedge compression fracture of T11-T12 vertebra, initial encounter for closed fracture: Secondary | ICD-10-CM | POA: Diagnosis not present

## 2017-02-07 LAB — UA/M W/RFLX CULTURE, ROUTINE
Bilirubin, UA: NEGATIVE
Glucose, UA: NEGATIVE
Ketones, UA: NEGATIVE
Leukocytes, UA: NEGATIVE
NITRITE UA: NEGATIVE
PH UA: 6 (ref 5.0–7.5)
Protein, UA: NEGATIVE
RBC UA: NEGATIVE
Specific Gravity, UA: 1.015 (ref 1.005–1.030)
Urobilinogen, Ur: 0.2 mg/dL (ref 0.2–1.0)

## 2017-02-07 NOTE — Progress Notes (Signed)
BP 121/81   Pulse 80   Temp (!) 97.5 F (36.4 C) (Oral)   Wt 130 lb 3.2 oz (59.1 kg)   SpO2 100%   BMI 18.68 kg/m    Subjective:    Patient ID: Scott Guzman, male    DOB: Mar 01, 1941, 76 y.o.   MRN: [id number]  HPI: Scott Guzman is a 76 y.o. male  Chief Complaint  Patient presents with  . Urinary Tract Infection    pt states he has been having urinary frequency and low back pain since for a while, states he has been putting it off    Patient here with generalized malaise, nocturia, low back pain x several days. Denies fever, chills, N/V, hematuria, dysuria. Unsure if his back pain is from his recent kyphoplasty from T12 compression fracture or a possible urinary infection. Has not tried anything OTC for sxs.   Relevant past medical, surgical, family and social history reviewed and updated as indicated. Interim medical history since our last visit reviewed. Allergies and medications reviewed and updated.  Review of Systems  Constitutional: Negative.   HENT: Negative.   Eyes: Negative.   Respiratory: Negative.   Cardiovascular: Negative.   Gastrointestinal: Negative.   Genitourinary: Positive for frequency.  Musculoskeletal: Positive for back pain.  Neurological: Negative.   Psychiatric/Behavioral: Negative.    Per HPI unless specifically indicated above     Objective:    BP 121/81   Pulse 80   Temp (!) 97.5 F (36.4 C) (Oral)   Wt 130 lb 3.2 oz (59.1 kg)   SpO2 100%   BMI 18.68 kg/m   Wt Readings from Last 3 Encounters:  02/07/17 130 lb 3.2 oz (59.1 kg)  02/01/17 130 lb (59 kg)  01/27/17 129 lb (58.5 kg)    Physical Exam  Constitutional: He is oriented to person, place, and time. No distress.  Underweight  HENT:  Head: Atraumatic.  Eyes: Conjunctivae are normal. Pupils are equal, round, and reactive to light.  Neck: Normal range of motion. Neck supple.  Cardiovascular: Normal rate and normal heart sounds.  Pulmonary/Chest: Effort normal and breath  sounds normal.  Abdominal: Soft. Bowel sounds are normal. He exhibits no distension. There is no tenderness. There is no guarding.  Musculoskeletal: Normal range of motion. He exhibits no tenderness (No CVA tenderness).  Neurological: He is alert and oriented to person, place, and time.  Skin: Skin is warm and dry.  Psychiatric: He has a normal mood and affect. His behavior is normal.  Nursing note and vitals reviewed.   Results for orders placed or performed in visit on 02/07/17  UA/M w/rflx Culture, Routine  Result Value Ref Range   Specific Gravity, UA 1.015 1.005 - 1.030   pH, UA 6.0 5.0 - 7.5   Color, UA Yellow Yellow   Appearance Ur Hazy (A) Clear   Leukocytes, UA Negative Negative   Protein, UA Negative Negative/Trace   Glucose, UA Negative Negative   Ketones, UA Negative Negative   RBC, UA Negative Negative   Bilirubin, UA Negative Negative   Urobilinogen, Ur 0.2 0.2 - 1.0 mg/dL   Nitrite, UA Negative Negative      Assessment & Plan:   Problem List Items Addressed This Visit    None    Visit Diagnoses    Urinary frequency    -  Primary   U/A neg for UTI or evidence of kidney stone. Await f/u later today with ortho surgery, likely back pain related to  recent procedure and not urinary issues   Relevant Orders   UA/M w/rflx Culture, Routine (Completed)       Follow up plan: Return if symptoms worsen or fail to improve.

## 2017-02-07 NOTE — Patient Instructions (Addendum)
Ask about gabapentin  [facility] Rheumatology

## 2017-02-10 DIAGNOSIS — H11421 Conjunctival edema, right eye: Secondary | ICD-10-CM | POA: Diagnosis not present

## 2017-02-10 DIAGNOSIS — H5711 Ocular pain, right eye: Secondary | ICD-10-CM | POA: Diagnosis not present

## 2017-02-12 ENCOUNTER — Ambulatory Visit: Payer: Medicare Other | Admitting: Family Medicine

## 2017-02-12 ENCOUNTER — Encounter: Payer: Self-pay | Admitting: Family Medicine

## 2017-02-12 ENCOUNTER — Ambulatory Visit: Payer: Self-pay | Admitting: *Deleted

## 2017-02-12 ENCOUNTER — Telehealth: Payer: Self-pay

## 2017-02-12 VITALS — BP 107/67 | HR 88 | Temp 97.5°F | Wt 129.8 lb

## 2017-02-12 DIAGNOSIS — F419 Anxiety disorder, unspecified: Secondary | ICD-10-CM | POA: Diagnosis not present

## 2017-02-12 DIAGNOSIS — R079 Chest pain, unspecified: Secondary | ICD-10-CM | POA: Diagnosis not present

## 2017-02-12 MED ORDER — SERTRALINE HCL 50 MG PO TABS: ORAL_TABLET | ORAL | 1 refills | Status: DC

## 2017-02-12 NOTE — Progress Notes (Signed)
BP 107/67 (BP Location: Right Arm, Patient Position: Sitting, Cuff Size: Normal)   Pulse 88   Temp (!) 97.5 F (36.4 C) (Oral)   Wt 129 lb 12.8 oz (58.9 kg)   SpO2 98%   BMI 18.62 kg/m    Subjective:    Patient ID: Scott Guzman, male    DOB: 1940/10/10, 76 y.o.   MRN: [id number]  HPI: Scott Guzman is a 76 y.o. male  Chief Complaint  Patient presents with  . Chest Pain    Started last night. Woke up around 3am sweating. Can't do a lot of activty. Patient is dizzy.  . Dizziness   Chest pressure and weakness with diaphoresis that woke him up around 3 am this morning. Episode did not last long and sxs have not recurred. Denies any SOB, palpitations, N/V. Remains fatigued and anxious since accident with compression fracture over a month ago (is s/p kyphoplasty).   Relevant past medical, surgical, family and social history reviewed and updated as indicated. Interim medical history since our last visit reviewed. Allergies and medications reviewed and updated.  Review of Systems  Constitutional: Positive for diaphoresis and fatigue.  Eyes: Negative.   Respiratory: Negative.   Cardiovascular: Positive for chest pain (pressure).  Gastrointestinal: Negative.   Genitourinary: Negative.   Musculoskeletal: Positive for back pain.  Neurological: Positive for weakness.  Psychiatric/Behavioral: Negative.    Per HPI unless specifically indicated above     Objective:    BP 107/67 (BP Location: Right Arm, Patient Position: Sitting, Cuff Size: Normal)   Pulse 88   Temp (!) 97.5 F (36.4 C) (Oral)   Wt 129 lb 12.8 oz (58.9 kg)   SpO2 98%   BMI 18.62 kg/m   Wt Readings from Last 3 Encounters:  02/12/17 129 lb 12.8 oz (58.9 kg)  02/07/17 130 lb 3.2 oz (59.1 kg)  02/01/17 130 lb (59 kg)    Physical Exam  Constitutional: He is oriented to person, place, and time. No distress.  Underweight  HENT:  Head: Atraumatic.  Eyes: Conjunctivae are normal. Pupils are equal, round, and  reactive to light.  Neck: Normal range of motion. Neck supple.  Cardiovascular: Normal rate, regular rhythm, normal heart sounds and intact distal pulses.  Pulmonary/Chest: Effort normal and breath sounds normal.  Musculoskeletal: Normal range of motion.  Neurological: He is alert and oriented to person, place, and time.  Skin: Skin is warm and dry.  Psychiatric: His behavior is normal. Thought content normal.  Appears anxious  Nursing note and vitals reviewed.   EKG benign today, showing NSR with no suspicious T wave or ST segment changes.  Results for orders placed or performed in visit on 02/07/17  UA/M w/rflx Culture, Routine  Result Value Ref Range   Specific Gravity, UA 1.015 1.005 - 1.030   pH, UA 6.0 5.0 - 7.5   Color, UA Yellow Yellow   Appearance Ur Hazy (A) Clear   Leukocytes, UA Negative Negative   Protein, UA Negative Negative/Trace   Glucose, UA Negative Negative   Ketones, UA Negative Negative   RBC, UA Negative Negative   Bilirubin, UA Negative Negative   Urobilinogen, Ur 0.2 0.2 - 1.0 mg/dL   Nitrite, UA Negative Negative      Assessment & Plan:   Problem List Items Addressed This Visit    None    Visit Diagnoses    Chest pain, unspecified type    -  Primary   EKG WNL, suspect his sxs  were anxiety related. Monitor closely for recurrence of sxs, go to ER for further CP   Relevant Orders   EKG 12-Lead (Completed)   Anxiety       Will add zoloft to help with his anxiousness and dysphoric moods related to his recent health status changes and difficulty coping. Monitor closely for benefit   Relevant Medications   sertraline (ZOLOFT) 50 MG tablet       Follow up plan: Return in about 4 weeks (around 03/12/2017) for Anxiety f/u.

## 2017-02-12 NOTE — Telephone Encounter (Signed)
Called patient and spoke with him about his appointment today.  Patient started having chest pain around 3AM last night, but his appointment wasn't until 2:00PM this afternoon. Apolonio Schneiders informed to call to see what was going on, patient may need to go to ED.  Explained to patient that if something critical has happened, each minute was critical. He may need to go to ED, especially with not being able to do activity. Patient refused. Patient stated that it could be pneumonia but he denied cough. Still explained that he may need to go to ED, patient still refused and stated that if he got any worse or his chest pain got worse he will. But if not, he'd keep his appointment with [place] at 2:00PM. Apolonio Schneiders notified.

## 2017-02-12 NOTE — Telephone Encounter (Signed)
   Answer Assessment - Initial Assessment Questions 1. LOCATION: "Where does it hurt?"       Upper chest-middle 2. RADIATION: "Does the pain go anywhere else?" (e.g., into neck, jaw, arms, back)     No radiation 3. ONSET: "When did the chest pain begin?" (Minutes, hours or days)      1 hour ago 4. PATTERN "Does the pain come and go, or has it been constant since it started?"  "Does it get worse with exertion?"      Comes and goes- not moving much 5. DURATION: "How long does it last" (e.g., seconds, minutes, hours)     No chest pain now 6. SEVERITY: "How bad is the pain?"  (e.g., Scale 1-10; mild, moderate, or severe)    - MILD (1-3): doesn't interfere with normal activities     - MODERATE (4-7): interferes with normal activities or awakens from sleep    - SEVERE (8-10): excruciating pain, unable to do any normal activities       5-6 7. CARDIAC RISK FACTORS: "Do you have any history of heart problems or risk factors for heart disease?" (e.g., prior heart attack, angina; high blood pressure, diabetes, being overweight, high cholesterol, smoking, or strong family history of heart disease)     no 8. PULMONARY RISK FACTORS: "Do you have any history of lung disease?"  (e.g., blood clots in lung, asthma, emphysema, birth control pills)     No 9. CAUSE: "What do you think is causing the chest pain?"     Patient is not sure - he has been checked by heart doctor 3-4 months ago 10. OTHER SYMPTOMS: "Do you have any other symptoms?" (e.g., dizziness, nausea, vomiting, sweating, fever, difficulty breathing, cough)       Pain in R eye- blood shot, sweating this morning, intermittent chest pain 11. PREGNANCY: "Is there any chance you are pregnant?" "When was your last menstrual period?"       n/a    Patient states he had sweating episode early this morning at 3 am. He had slight medial chest pain that resolved about an hour ago. Patient has no other symptoms at this time. Appointment given for this  afternoon.  Protocols used: CHEST PAIN-A-AH

## 2017-02-15 NOTE — Patient Instructions (Signed)
Follow up as needed

## 2017-02-17 ENCOUNTER — Other Ambulatory Visit: Payer: Self-pay | Admitting: Rheumatology

## 2017-02-17 ENCOUNTER — Telehealth: Payer: Self-pay

## 2017-02-17 DIAGNOSIS — R634 Abnormal weight loss: Secondary | ICD-10-CM

## 2017-02-17 NOTE — Telephone Encounter (Signed)
Call pt 

## 2017-02-17 NOTE — Telephone Encounter (Signed)
Patient is requesting a 90 supply of Meloxicam to be sent to [facility], patient has not been on this medication since 2017, that I see

## 2017-02-18 NOTE — Telephone Encounter (Signed)
Patient was transferred to provider for telephone conversation.   

## 2017-02-18 NOTE — Telephone Encounter (Signed)
Left message on machine for pt to return call to the office.  

## 2017-02-18 NOTE — Telephone Encounter (Signed)
Phone call Discussed with patient does not want the meloxicam as requested from the pharmacy will discontinue. Discussed patient to increase Zoloft to 50 mg.

## 2017-02-20 ENCOUNTER — Emergency Department
Admission: EM | Admit: 2017-02-20 | Discharge: 2017-02-20 | Disposition: A | Discharge status: Home / Self Care | Payer: Medicare Other | Attending: Emergency Medicine | Admitting: Emergency Medicine

## 2017-02-20 ENCOUNTER — Encounter: Payer: Self-pay | Admitting: Intensive Care

## 2017-02-20 DIAGNOSIS — I4891 Unspecified atrial fibrillation: Secondary | ICD-10-CM | POA: Insufficient documentation

## 2017-02-20 DIAGNOSIS — Z85038 Personal history of other malignant neoplasm of large intestine: Secondary | ICD-10-CM | POA: Diagnosis not present

## 2017-02-20 DIAGNOSIS — Z79899 Other long term (current) drug therapy: Secondary | ICD-10-CM | POA: Diagnosis not present

## 2017-02-20 DIAGNOSIS — R000 Tachycardia, unspecified: Secondary | ICD-10-CM | POA: Diagnosis not present

## 2017-02-20 DIAGNOSIS — I959 Hypotension, unspecified: Secondary | ICD-10-CM | POA: Diagnosis present

## 2017-02-20 LAB — CBC
HCT: 41.8 % (ref 40.0–52.0)
HEMOGLOBIN: 13.8 g/dL (ref 13.0–18.0)
MCH: 28.4 pg (ref 26.0–34.0)
MCHC: 33 g/dL (ref 32.0–36.0)
MCV: 86 fL (ref 80.0–100.0)
Platelets: 276 10*3/uL (ref 150–440)
RBC: 4.86 MIL/uL (ref 4.40–5.90)
RDW: 15.5 % — ABNORMAL HIGH (ref 11.5–14.5)
WBC: 9.2 10*3/uL (ref 3.8–10.6)

## 2017-02-20 LAB — BASIC METABOLIC PANEL
ANION GAP: 10 (ref 5–15)
BUN: 31 mg/dL — ABNORMAL HIGH (ref 6–20)
CALCIUM: 9 mg/dL (ref 8.9–10.3)
CO2: 22 mmol/L (ref 22–32)
Chloride: 107 mmol/L (ref 101–111)
Creatinine, Ser: 1.2 mg/dL (ref 0.61–1.24)
GFR, EST NON AFRICAN AMERICAN: 58 mL/min — AB (ref >60)
Glucose, Bld: 107 mg/dL — ABNORMAL HIGH (ref 65–99)
Potassium: 4.3 mmol/L (ref 3.5–5.1)
SODIUM: 139 mmol/L (ref 135–145)

## 2017-02-20 LAB — MAGNESIUM: Magnesium: 2.2 mg/dL (ref 1.7–2.4)

## 2017-02-20 LAB — URINALYSIS, COMPLETE (UACMP) WITH MICROSCOPIC
BILIRUBIN URINE: NEGATIVE
Bacteria, UA: NONE SEEN
GLUCOSE, UA: NEGATIVE mg/dL
HGB URINE DIPSTICK: NEGATIVE
KETONES UR: NEGATIVE mg/dL
Leukocytes, UA: NEGATIVE
NITRITE: NEGATIVE
PROTEIN: NEGATIVE mg/dL
Specific Gravity, Urine: 1.02 (ref 1.005–1.030)
Squamous Epithelial / LPF: NONE SEEN
WBC UA: NONE SEEN WBC/hpf (ref 0–5)
pH: 5 (ref 5.0–8.0)

## 2017-02-20 LAB — TSH: TSH: 2.501 u[IU]/mL (ref 0.350–4.500)

## 2017-02-20 LAB — T4, FREE: Free T4: 1.07 ng/dL (ref 0.61–1.12)

## 2017-02-20 LAB — TROPONIN I: Troponin I: <0.03 ng/mL (ref <0.03)

## 2017-02-20 MED ORDER — METOPROLOL TARTRATE 12.5 MG HALF TABLET
12.5000 mg | ORAL_TABLET | Freq: Once | ORAL | Status: AC
  Administered 2017-02-20: 12.5 mg via ORAL

## 2017-02-20 MED ORDER — SODIUM CHLORIDE 0.9 % IV BOLUS (SEPSIS)
1000.0000 mL | Freq: Once | INTRAVENOUS | Status: AC
  Administered 2017-02-20: 1000 mL via INTRAVENOUS

## 2017-02-20 MED ORDER — ASPIRIN 81 MG PO CHEW
324.0000 mg | CHEWABLE_TABLET | Freq: Once | ORAL | Status: AC
  Administered 2017-02-20: 324 mg via ORAL
  Filled 2017-02-20: qty 4

## 2017-02-20 MED ORDER — MAGNESIUM SULFATE 2 GM/50ML IV SOLN
2.0000 g | Freq: Once | INTRAVENOUS | Status: AC
  Administered 2017-02-20: 2 g via INTRAVENOUS
  Filled 2017-02-20 (×2): qty 50

## 2017-02-20 MED ORDER — METOPROLOL TARTRATE 25 MG PO TABS: 25.0000 mg | ORAL_TABLET | Freq: Two times a day (BID) | ORAL | 1 refills | Status: DC

## 2017-02-20 MED ORDER — METOPROLOL TARTRATE 25 MG PO TABS
12.5000 mg | ORAL_TABLET | Freq: Once | ORAL | Status: AC
Start: 2017-02-20 — End: 2017-02-20
  Administered 2017-02-20: 12.5 mg via ORAL
  Filled 2017-02-20: qty 1
  Filled 2017-02-20: qty 0.5

## 2017-02-20 MED ORDER — METOPROLOL TARTRATE 50 MG PO TABS
ORAL_TABLET | ORAL | Status: AC
  Filled 2017-02-20: qty 1

## 2017-02-20 MED ORDER — ASPIRIN 81 MG PO TABS: 324.0000 mg | ORAL_TABLET | Freq: Every day | ORAL | 0 refills | Status: DC

## 2017-02-20 NOTE — ED Triage Notes (Addendum)
Patient was being seen at [facility] today for weakness and fatigue. [facility] sent patient over to ED for low blood pressure reading of 84/52. Patient reports these symptoms have been going on a year. Reports he does not drink enough liquids but is eating normally. A&O x4. Denies dizziness or blurry vision. Reports to RN he did not eat breakfast and sometimes eats lunch

## 2017-02-20 NOTE — ED Provider Notes (Signed)
[facility] Emergency Department Provider Note  ____________________________________________  Time seen: Approximately 5:45 PM  I have reviewed the triage vital signs and the nursing notes.   HISTORY  Chief Complaint Hypotension   HPI Scott Guzman is a 76 y.o. male with a history of remote colon cancer status post resection and hyperlipidemia who presents for evaluation of hypotension and dizziness. Patient reports for over a year he has been feeling very fatigued and having episodes of dizziness where he feels like he is going to pass out. These episodes happen usually after he stands up and takes a few steps. No syncopal episodes. Today he took his wife to see her cardiologist and had an episode while there. They checked his blood pressure which was84/52 with a heart rate of 56 and recommended that he came to the emergency room for evaluation. Patient denies unintentional weight loss. He endorses generalized weakness and fatigue which has been chronic for him. Has seen several physicians with unclear etiology of his symptoms. Patient denies changes in his appetite, headache, URI symptoms, chest pain, shortness of breath, abdominal pain, palpitations, nausea, vomiting, diarrhea, dysuria. Patient denies night sweats. Patient is due for surveillance colonoscopy in a month  Past Medical History:  Diagnosis Date  . Colon cancer ([place]) 2000   He states he had a partial colon resection.   . Hyperlipidemia   . PVC (premature ventricular contraction)     Patient Active Problem List   Diagnosis Date Noted  . Thoracic compression fracture ([place]) 01/13/2017  . Palpitations 06/25/2016  . Fatigue 06/25/2016  . 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 BPH (benign prostatic hyperplasia) 03/12/2016  . Insomnia 10/31/2014  . Hypercholesterolemia 10/13/2014  . Colon cancer ([place]) 10/13/2014    Past Surgical History:  Procedure Laterality Date  . COLON SURGERY    .  KYPHOPLASTY N/A 01/23/2017   Procedure: [id number];  Surgeon: [name], MD;  Location: [facility] ORS;  Service: Orthopedics;  Laterality: N/A;    Prior to Admission medications   Medication Sig Start Date End Date Taking? Authorizing Provider  aspirin 81 MG tablet Take 4 tablets (324 mg total) by mouth daily. 02/20/17   Rudene Re, MD  Baclofen 5 MG TABS Take 5 mg by mouth 3 (three) times daily as needed. 01/07/17   Volney American, PA-C  meclizine (ANTIVERT) 25 MG tablet Take 1 tablet (25 mg total) by mouth 3 (three) times daily as needed for dizziness. Patient not taking: Reported on 01/07/2017 06/19/16   Volney American, PA-C  megestrol (MEGACE) 40 MG tablet Take 1 tablet (40 mg total) 3 (three) times daily as needed by mouth. 01/27/17   Volney American, PA-C  metoprolol tartrate (LOPRESSOR) 25 MG tablet Take 1 tablet (25 mg total) by mouth 2 (two) times daily. 02/20/17 02/20/18  Rudene Re, MD  mirtazapine (REMERON) 30 MG tablet Take 1 tablet (30 mg total) by mouth at bedtime. 12/12/16   Volney American, PA-C  oxyCODONE (OXY IR/ROXICODONE) 5 MG immediate release tablet Take by mouth. Take 1 to 2 tablets by mouth every 6 hours PRN 01/30/17   [provider]  prednisoLONE acetate (PRED FORTE) 1 % ophthalmic suspension USE 1 DROP IN AFFECTED EYE 4 TIMES A DAY FOR 1 WEEK 02/03/17   [provider]  predniSONE (DELTASONE) 5 MG tablet Take 5 mg daily by mouth. 02/03/17   [provider]  sertraline (ZOLOFT) 50 MG tablet Take 1/2 tablet daily x 1 week, then  can increase as tolerated to 1 tablet daily 02/12/17   Volney American, PA-C    Allergies Patient has no known allergies.  History reviewed. No pertinent family history.  Social History Social History   Tobacco Use  . Smoking status: Never Smoker  . Smokeless tobacco: Never Used  Substance Use Topics  . Alcohol use: No    Alcohol/week: 0.0 oz  . Drug use:  No    Review of Systems  Constitutional: Negative for fever. + Generalized fatigue, weakness, and dizziness Eyes: Negative for visual changes. ENT: Negative for sore throat. Neck: No neck pain  Cardiovascular: Negative for chest pain. Respiratory: Negative for shortness of breath. Gastrointestinal: Negative for abdominal pain, vomiting or diarrhea. Genitourinary: Negative for dysuria. Musculoskeletal: Negative for back pain. Skin: Negative for rash. Neurological: Negative for headaches, weakness or numbness. Psych: No SI or HI  ____________________________________________   PHYSICAL EXAM:  VITAL SIGNS: ED Triage Vitals [02/20/17 1145]  Enc Vitals Group     BP 120/82     Pulse Rate (!) 120     Resp 16     Temp 98.1 F (36.7 C)     Temp Source Oral     SpO2 95 %     Weight 129 lb (58.5 kg)     Height 5\' 10"  (1.778 m)     Head Circumference      Peak Flow      Pain Score      Pain Loc      Pain Edu?      Excl. in [place]?     Constitutional: Alert and oriented. Well appearing and in no apparent distress. HEENT:      Head: Normocephalic and atraumatic.         Eyes: Conjunctivae are normal. Sclera is non-icteric.       Mouth/Throat: Mucous membranes are moist.       Neck: Supple with no signs of meningismus. Cardiovascular: Irregularly irregular rhythm with tachycardic rate. No murmurs, gallops, or rubs. 2+ symmetrical distal pulses are present in all extremities. No JVD. Respiratory: Normal respiratory effort. Lungs are clear to auscultation bilaterally. No wheezes, crackles, or rhonchi.  Gastrointestinal: Soft, non tender, and non distended with positive bowel sounds. No rebound or guarding. Genitourinary: No CVA tenderness. Musculoskeletal: Nontender with normal range of motion in all extremities. No edema, cyanosis, or erythema of extremities. Neurologic: Normal speech and language. Face is symmetric. Moving all extremities. No gross focal neurologic deficits are  appreciated. Skin: Skin is warm, dry and intact. No rash noted. Psychiatric: Mood and affect are normal. Speech and behavior are normal.  ____________________________________________   LABS (all labs ordered are listed, but only abnormal results are displayed)  Labs Reviewed  BASIC METABOLIC PANEL - Abnormal; Notable for the following components:      Result Value   Glucose, Bld 107 (*)    BUN 31 (*)    GFR calc non Af Amer 58 (*)    All other components within normal limits  CBC - Abnormal; Notable for the following components:   RDW 15.5 (*)    All other components within normal limits  URINALYSIS, COMPLETE (UACMP) WITH MICROSCOPIC - Abnormal; Notable for the following components:   Color, Urine YELLOW (*)    APPearance CLEAR (*)    All other components within normal limits  MAGNESIUM  TROPONIN I  TSH  T4, FREE  CBG MONITORING, ED   ____________________________________________  EKG  ED ECG REPORT I, Rudene Re,  the attending physician, personally viewed and interpreted this ECG.  Atrial fibrillation, ventricular rate of 120, normal QRS and QTc intervals, normal axis, no ST elevations or depressions. A. fib is new when compared to prior.  ____________________________________________  RADIOLOGY  none  ____________________________________________   PROCEDURES  Procedure(s) performed: None Procedures Critical Care performed:  None ____________________________________________   INITIAL IMPRESSION / ASSESSMENT AND PLAN / ED COURSE  76 y.o. male with a history of remote colon cancer status post resection and hyperlipidemia who presents for evaluation of fatigue, dizziness for over a year and today found to be hypotensive at the doctor's office. Upon arrival to the emergency department patient's blood pressure is normal and he is found to be in atrial fibrillation with RVR which is a new diagnosis for patient. His EKG shows no ischemic changes. He was given a  dose of 25 mg of by mouth metoprolol with great rate control and normalization of his blood pressure. Electrolytes are within normal limits. Thyroid studies are normal. There is no evidence of infection. Troponin is negative. Patient looks dry on exam and was given fluids. There is no personal or family history of blood clots, no chest pain or shortness of breath, no asymmetric leg swelling with no clinical concern for PE. Patient feels markedly improved after fluids. ChadsVasc2 score of 1 for which he was started on aspirin. Since rate is well controlled, labs are WNL, and patient feels improved I do believe is safe for patient to be discharged home at this time with close follow up with cardiology. Discussed patient's clinical presentation, labs, and management with Dr. Humphrey Rolls, Cardiologist on call who is in agreement and will follow patient in clinic tomorrow at [facility].       As part of my medical decision making, I reviewed the following data within the [place] notes reviewed and incorporated, Labs reviewed , EKG interpreted , Old EKG reviewed, Old chart reviewed, A consult was requested and obtained from this/these consultant(s) Cardiology, Notes from prior ED visits and [place] Controlled Substance Database    Pertinent labs & imaging results that were available during my care of the patient were reviewed by me and considered in my medical decision making (see chart for details).    ____________________________________________   FINAL CLINICAL IMPRESSION(S) / ED DIAGNOSES  Final diagnoses:  Atrial fibrillation with RVR ([place])      NEW MEDICATIONS STARTED DURING THIS VISIT:  ED Discharge Orders        Ordered    metoprolol tartrate (LOPRESSOR) 25 MG tablet  2 times daily     02/20/17 1758    aspirin 81 MG tablet  Daily     02/20/17 1758       Note:  This document was prepared using Dragon voice recognition software and may include unintentional dictation  errors.    Rudene Re, MD 02/20/17 [phone]

## 2017-02-20 NOTE — ED Notes (Signed)
Pt requests to use the restroom; wanted to walk himself to restroom. BP 151/84; pt ambulated with no difficulty to restroom

## 2017-02-21 ENCOUNTER — Telehealth: Payer: Self-pay | Admitting: Family Medicine

## 2017-02-21 DIAGNOSIS — R0602 Shortness of breath: Secondary | ICD-10-CM | POA: Diagnosis not present

## 2017-02-21 DIAGNOSIS — I4891 Unspecified atrial fibrillation: Secondary | ICD-10-CM | POA: Diagnosis not present

## 2017-02-21 DIAGNOSIS — R079 Chest pain, unspecified: Secondary | ICD-10-CM | POA: Diagnosis not present

## 2017-02-21 DIAGNOSIS — I2510 Atherosclerotic heart disease of native coronary artery without angina pectoris: Secondary | ICD-10-CM | POA: Diagnosis not present

## 2017-02-21 DIAGNOSIS — R55 Syncope and collapse: Secondary | ICD-10-CM | POA: Diagnosis not present

## 2017-02-21 NOTE — Telephone Encounter (Signed)
Drug rx expired 03/12/16

## 2017-02-21 NOTE — Telephone Encounter (Signed)
Copied from [place] [id number]. Topic: Quick Communication - Rx Refill/Question >> Feb 21, 2017 11:29 AM Patrice Paradise wrote: Timmothy Sours from [facility] call to get authority to refill Rx for meloxicam (MOBIC) 15 MG tablet. Number to reach Timmothy Sours is [phone]  Has the patient contacted their pharmacy? yes   (Agent: If no, request that the patient contact the pharmacy for the refill.)  Preferred Pharmacy (with phone number or street name):  [place], [place] [facility] [address] [place] [address] [place] [address] Phone: [phone] Fax: [phone]   Agent: Please be advised that RX refills may take up to 48 hours. We ask that you follow-up with your pharmacy.

## 2017-02-24 ENCOUNTER — Other Ambulatory Visit: Payer: Self-pay | Admitting: Family Medicine

## 2017-02-24 MED ORDER — MELOXICAM 15 MG PO TABS: 15.0000 mg | ORAL_TABLET | Freq: Every day | ORAL | 3 refills | Status: DC

## 2017-02-24 NOTE — Telephone Encounter (Signed)
Call [facility]

## 2017-02-24 NOTE — Telephone Encounter (Signed)
[facility] called to request this refill.

## 2017-02-25 ENCOUNTER — Ambulatory Visit
Admission: RE | Admit: 2017-02-25 | Discharge: 2017-02-25 | Disposition: A | Discharge status: Home / Self Care | Payer: Medicare Other | Source: Ambulatory Visit | Attending: Rheumatology | Admitting: Rheumatology

## 2017-02-25 DIAGNOSIS — R0602 Shortness of breath: Secondary | ICD-10-CM | POA: Diagnosis not present

## 2017-02-25 DIAGNOSIS — I4891 Unspecified atrial fibrillation: Secondary | ICD-10-CM | POA: Diagnosis not present

## 2017-02-25 DIAGNOSIS — I7389 Other specified peripheral vascular diseases: Secondary | ICD-10-CM | POA: Diagnosis not present

## 2017-02-25 DIAGNOSIS — I2510 Atherosclerotic heart disease of native coronary artery without angina pectoris: Secondary | ICD-10-CM | POA: Diagnosis not present

## 2017-02-28 DIAGNOSIS — M545 Low back pain: Secondary | ICD-10-CM | POA: Diagnosis not present

## 2017-03-05 DIAGNOSIS — E538 Deficiency of other specified B group vitamins: Secondary | ICD-10-CM | POA: Diagnosis not present

## 2017-03-05 DIAGNOSIS — M316 Other giant cell arteritis: Secondary | ICD-10-CM | POA: Diagnosis not present

## 2017-03-05 DIAGNOSIS — R700 Elevated erythrocyte sedimentation rate: Secondary | ICD-10-CM | POA: Diagnosis not present

## 2017-03-07 DIAGNOSIS — Z9889 Other specified postprocedural states: Secondary | ICD-10-CM | POA: Diagnosis not present

## 2017-03-10 DIAGNOSIS — I4891 Unspecified atrial fibrillation: Secondary | ICD-10-CM | POA: Diagnosis not present

## 2017-03-10 DIAGNOSIS — R002 Palpitations: Secondary | ICD-10-CM | POA: Diagnosis not present

## 2017-03-10 DIAGNOSIS — I2510 Atherosclerotic heart disease of native coronary artery without angina pectoris: Secondary | ICD-10-CM | POA: Diagnosis not present

## 2017-03-10 DIAGNOSIS — R0602 Shortness of breath: Secondary | ICD-10-CM | POA: Diagnosis not present

## 2017-03-13 ENCOUNTER — Encounter: Payer: 59 | Admitting: Family Medicine

## 2017-04-01 DIAGNOSIS — I2510 Atherosclerotic heart disease of native coronary artery without angina pectoris: Secondary | ICD-10-CM | POA: Diagnosis not present

## 2017-04-01 DIAGNOSIS — R0602 Shortness of breath: Secondary | ICD-10-CM | POA: Diagnosis not present

## 2017-04-01 DIAGNOSIS — I10 Essential (primary) hypertension: Secondary | ICD-10-CM | POA: Diagnosis not present

## 2017-04-01 DIAGNOSIS — I4891 Unspecified atrial fibrillation: Secondary | ICD-10-CM | POA: Diagnosis not present

## 2017-04-04 ENCOUNTER — Other Ambulatory Visit: Payer: Self-pay | Admitting: Family Medicine

## 2017-04-04 DIAGNOSIS — R700 Elevated erythrocyte sedimentation rate: Secondary | ICD-10-CM | POA: Diagnosis not present

## 2017-04-04 NOTE — Telephone Encounter (Signed)
Refill request

## 2017-04-08 DIAGNOSIS — R2689 Other abnormalities of gait and mobility: Secondary | ICD-10-CM | POA: Diagnosis not present

## 2017-04-08 DIAGNOSIS — R700 Elevated erythrocyte sedimentation rate: Secondary | ICD-10-CM | POA: Diagnosis not present

## 2017-04-08 DIAGNOSIS — I4891 Unspecified atrial fibrillation: Secondary | ICD-10-CM | POA: Diagnosis not present

## 2017-04-25 DIAGNOSIS — I4891 Unspecified atrial fibrillation: Secondary | ICD-10-CM | POA: Diagnosis not present

## 2017-04-25 DIAGNOSIS — R0602 Shortness of breath: Secondary | ICD-10-CM | POA: Diagnosis not present

## 2017-04-25 DIAGNOSIS — I10 Essential (primary) hypertension: Secondary | ICD-10-CM | POA: Diagnosis not present

## 2017-05-02 ENCOUNTER — Encounter: Payer: Self-pay | Admitting: *Deleted

## 2017-05-05 ENCOUNTER — Ambulatory Visit
Admission: RE | Admit: 2017-05-05 | Discharge: 2017-05-05 | Disposition: A | Discharge status: Home / Self Care | Payer: Medicare Other | Source: Ambulatory Visit | Attending: Unknown Physician Specialty | Admitting: Unknown Physician Specialty

## 2017-05-05 ENCOUNTER — Encounter
Admission: RE | Disposition: A | Discharge status: Home / Self Care | Payer: Self-pay | Source: Ambulatory Visit | Attending: Unknown Physician Specialty

## 2017-05-05 ENCOUNTER — Ambulatory Visit: Payer: Medicare Other | Admitting: Anesthesiology

## 2017-05-05 DIAGNOSIS — Z7901 Long term (current) use of anticoagulants: Secondary | ICD-10-CM | POA: Diagnosis not present

## 2017-05-05 DIAGNOSIS — Z85038 Personal history of other malignant neoplasm of large intestine: Secondary | ICD-10-CM | POA: Diagnosis not present

## 2017-05-05 DIAGNOSIS — Z79899 Other long term (current) drug therapy: Secondary | ICD-10-CM | POA: Insufficient documentation

## 2017-05-05 DIAGNOSIS — Z9049 Acquired absence of other specified parts of digestive tract: Secondary | ICD-10-CM | POA: Diagnosis not present

## 2017-05-05 DIAGNOSIS — Z1211 Encounter for screening for malignant neoplasm of colon: Secondary | ICD-10-CM | POA: Insufficient documentation

## 2017-05-05 DIAGNOSIS — I4891 Unspecified atrial fibrillation: Secondary | ICD-10-CM | POA: Diagnosis not present

## 2017-05-05 DIAGNOSIS — I739 Peripheral vascular disease, unspecified: Secondary | ICD-10-CM | POA: Insufficient documentation

## 2017-05-05 DIAGNOSIS — E785 Hyperlipidemia, unspecified: Secondary | ICD-10-CM | POA: Insufficient documentation

## 2017-05-05 DIAGNOSIS — K648 Other hemorrhoids: Secondary | ICD-10-CM | POA: Diagnosis not present

## 2017-05-05 DIAGNOSIS — I493 Ventricular premature depolarization: Secondary | ICD-10-CM | POA: Insufficient documentation

## 2017-05-05 DIAGNOSIS — K640 First degree hemorrhoids: Secondary | ICD-10-CM | POA: Diagnosis not present

## 2017-05-05 DIAGNOSIS — Z7952 Long term (current) use of systemic steroids: Secondary | ICD-10-CM | POA: Diagnosis not present

## 2017-05-05 DIAGNOSIS — I10 Essential (primary) hypertension: Secondary | ICD-10-CM | POA: Insufficient documentation

## 2017-05-05 DIAGNOSIS — I2510 Atherosclerotic heart disease of native coronary artery without angina pectoris: Secondary | ICD-10-CM | POA: Insufficient documentation

## 2017-05-05 HISTORY — DX: Essential (primary) hypertension: I10

## 2017-05-05 HISTORY — DX: Dyspnea, unspecified: R06.00

## 2017-05-05 HISTORY — DX: Atherosclerotic heart disease of native coronary artery without angina pectoris: I25.10

## 2017-05-05 HISTORY — DX: Cardiac arrhythmia, unspecified: I49.9

## 2017-05-05 HISTORY — PX: COLONOSCOPY WITH PROPOFOL: SHX5780

## 2017-05-05 HISTORY — DX: Dysphagia, unspecified: R13.10

## 2017-05-05 HISTORY — DX: Other giant cell arteritis: M31.6

## 2017-05-05 LAB — HM COLONOSCOPY

## 2017-05-05 SURGERY — COLONOSCOPY WITH PROPOFOL
Anesthesia: General

## 2017-05-05 MED ORDER — SODIUM CHLORIDE 0.9 % IV SOLN
INTRAVENOUS | Status: DC
  Administered 2017-05-05: 1000 mL via INTRAVENOUS

## 2017-05-05 MED ORDER — LIDOCAINE HCL (PF) 1 % IJ SOLN
INTRAMUSCULAR | Status: AC
  Administered 2017-05-05: 0.3 mg via INTRADERMAL
  Filled 2017-05-05: qty 2

## 2017-05-05 MED ORDER — PROPOFOL 10 MG/ML IV BOLUS
INTRAVENOUS | Status: DC | PRN
  Administered 2017-05-05: 30 mg via INTRAVENOUS
  Administered 2017-05-05: 50 mg via INTRAVENOUS

## 2017-05-05 MED ORDER — LIDOCAINE HCL (PF) 1 % IJ SOLN
2.0000 mL | Freq: Once | INTRAMUSCULAR | Status: AC
  Administered 2017-05-05: 0.3 mg via INTRADERMAL

## 2017-05-05 MED ORDER — SODIUM CHLORIDE 0.9 % IV SOLN: INTRAVENOUS | Status: DC

## 2017-05-05 MED ORDER — PROPOFOL 500 MG/50ML IV EMUL
INTRAVENOUS | Status: AC
  Filled 2017-05-05: qty 50

## 2017-05-05 MED ORDER — LIDOCAINE HCL (CARDIAC) 20 MG/ML IV SOLN
INTRAVENOUS | Status: DC | PRN
  Administered 2017-05-05: 50 mg via INTRAVENOUS

## 2017-05-05 MED ORDER — PROPOFOL 500 MG/50ML IV EMUL
INTRAVENOUS | Status: DC | PRN
  Administered 2017-05-05: 160 ug/kg/min via INTRAVENOUS

## 2017-05-05 NOTE — Anesthesia Preprocedure Evaluation (Signed)
Anesthesia Evaluation  Patient identified by MRN, date of birth, ID band Patient awake    Reviewed: Allergy & Precautions, H&P , NPO status , Patient's Chart, lab work & pertinent test results, reviewed documented beta blocker date and time   Airway Mallampati: II   Neck ROM: full    Dental  (+) Poor Dentition, Teeth Intact   Pulmonary neg pulmonary ROS, shortness of breath and with exertion,    Pulmonary exam normal        Cardiovascular Exercise Tolerance: Good hypertension, On Medications + CAD and + Peripheral Vascular Disease  negative cardio ROS Normal cardiovascular exam+ dysrhythmias  Rhythm:regular Rate:Normal     Neuro/Psych negative neurological ROS  negative psych ROS   GI/Hepatic negative GI ROS, Neg liver ROS,   Endo/Other  negative endocrine ROS  Renal/GU negative Renal ROS  negative genitourinary   Musculoskeletal   Abdominal   Peds  Hematology negative hematology ROS (+)   Anesthesia Other Findings Past Medical History: 2000: Colon cancer ([place])     Comment:  He states he had a partial colon resection.  No date: Coronary artery disease No date: Dysphagia No date: Dyspnea No date: Dysrhythmia     Comment:  Atrial Fibrillation No date: Hyperlipidemia No date: Hypertension No date: PVC (premature ventricular contraction) No date: Temporal arteritis ([place]) Past Surgical History: No date: BACK SURGERY     Comment:  Kyphoplasty T12  (Nov. 2018) No date: COLON SURGERY 01/23/2017: KYPHOPLASTY; N/A     Comment:  Procedure: KYPHOPLASTY-T12;  Surgeon: [name], MD;              Location: [facility] ORS;  Service: Orthopedics;  Laterality:               N/A;   Reproductive/Obstetrics negative OB ROS                             Anesthesia Physical Anesthesia Plan  ASA: III  Anesthesia Plan: General   Post-op Pain Management:    Induction:   PONV Risk Score and Plan:    Airway Management Planned:   Additional Equipment:   Intra-op Plan:   Post-operative Plan:   Informed Consent: I have reviewed the patients History and Physical, chart, labs and discussed the procedure including the risks, benefits and alternatives for the proposed anesthesia with the patient or authorized representative who has indicated his/her understanding and acceptance.   Dental Advisory Given  Plan Discussed with: CRNA  Anesthesia Plan Comments:         Anesthesia Quick Evaluation

## 2017-05-05 NOTE — Transfer of Care (Signed)
Immediate Anesthesia Transfer of Care Note  Patient: Scott Guzman  Procedure(s) Performed: COLONOSCOPY WITH PROPOFOL (N/A )  Patient Location: PACU  Anesthesia Type:General  Level of Consciousness: sedated  Airway & Oxygen Therapy: Patient Spontanous Breathing and Patient connected to nasal cannula oxygen  Post-op Assessment: Report given to RN and Post -op Vital signs reviewed and stable  Post vital signs: Reviewed and stable  Last Vitals:  Vitals:   05/05/17 0749 05/05/17 0843  BP: 130/73 (!) 100/59  Pulse: 63 (!) 52  Resp: 16 13  Temp: 36.4 C (!) 35.8 C  SpO2: 100% 98%    Last Pain:  Vitals:   05/05/17 0843  TempSrc: Tympanic         Complications: No apparent anesthesia complications

## 2017-05-05 NOTE — Op Note (Signed)
[facility] Gastroenterology Patient Name: Scott Guzman Procedure Date: 05/05/2017 8:09 AM MRN: [id number] Account #: [id number] Date of Birth: [birth year] Admit Type: Outpatient Age: 77 Room: [facility] ENDO ROOM 3 Gender: Male Note Status: Finalized Procedure:            Colonoscopy Indications:          High risk colon cancer surveillance: Personal history                        of colon cancer Providers:            Manya Silvas, MD Referring MD:         Guadalupe Maple, MD (Referring MD) Medicines:            Propofol per Anesthesia Complications:        No immediate complications. Procedure:            Pre-Anesthesia Assessment:                       - After reviewing the risks and benefits, the patient                        was deemed in satisfactory condition to undergo the                        procedure.                       After obtaining informed consent, the colonoscope was                        passed under direct vision. Throughout the procedure,                        the patient's blood pressure, pulse, and oxygen                        saturations were monitored continuously. The                        Colonoscope was introduced through the anus and                        advanced to the the cecum, identified by appendiceal                        orifice and ileocecal valve. The colonoscopy was                        performed without difficulty. The patient tolerated the                        procedure well. The quality of the bowel preparation                        was good. Findings:      Internal hemorrhoids were found during endoscopy. The hemorrhoids were       small and Grade I (internal hemorrhoids that do not prolapse).      The exam was otherwise without abnormality. Impression:           -  Internal hemorrhoids.                       - The examination was otherwise normal.                       - No specimens  collected. Recommendation:       - Repeat colonoscopy in 5 years for surveillance. Manya Silvas, MD 05/05/2017 8:41:53 AM This report has been signed electronically. Number of Addenda: 0 Note Initiated On: 05/05/2017 8:09 AM Scope Withdrawal Time: 0 hours 9 minutes 20 seconds  Total Procedure Duration: 0 hours 18 minutes 42 seconds       [facility]

## 2017-05-05 NOTE — Anesthesia Procedure Notes (Signed)
Date/Time: 05/05/2017 8:20 AM Performed by: Johnna Acosta, CRNA Pre-anesthesia Checklist: Patient identified, Emergency Drugs available, Suction available, Patient being monitored and Timeout performed Patient Re-evaluated:Patient Re-evaluated prior to induction Oxygen Delivery Method: Nasal cannula

## 2017-05-05 NOTE — H&P (Signed)
Primary Care Physician:  Guadalupe Maple, MD Primary Gastroenterologist:  Dr. Vira Agar  Pre-Procedure History & Physical: HPI:  Scott Guzman is a 77 y.o. male is here for an colonoscopy.   Past Medical History:  Diagnosis Date  . Colon cancer ([place]) 2000   He states he had a partial colon resection.   . Coronary artery disease   . Dysphagia   . Dyspnea   . Dysrhythmia    Atrial Fibrillation  . Hyperlipidemia   . Hypertension   . PVC (premature ventricular contraction)   . Temporal arteritis [facility])     Past Surgical History:  Procedure Laterality Date  . BACK SURGERY     Kyphoplasty T12  (Nov. 2018)  . COLON SURGERY    . KYPHOPLASTY N/A 01/23/2017   Procedure: [id number];  Surgeon: [name], MD;  Location: [facility] ORS;  Service: Orthopedics;  Laterality: N/A;    Prior to Admission medications   Medication Sig Start Date End Date Taking? Authorizing Provider  amiodarone (PACERONE) 200 MG tablet Take 200 mg by mouth daily.   Yes [provider]  rivaroxaban (XARELTO) 20 MG TABS tablet Take 20 mg by mouth daily with supper.   Yes [provider]  rosuvastatin (CRESTOR) 20 MG tablet Take 20 mg by mouth daily.   Yes [provider]  mirtazapine (REMERON) 30 MG tablet TAKE 1 TABLET BY MOUTH AT  BEDTIME 04/04/17   Guadalupe Maple, MD  oxyCODONE (OXY IR/ROXICODONE) 5 MG immediate release tablet Take by mouth. Take 1 to 2 tablets by mouth every 6 hours PRN 01/30/17   [provider]  prednisoLONE acetate (PRED FORTE) 1 % ophthalmic suspension USE 1 DROP IN AFFECTED EYE 4 TIMES A DAY FOR 1 WEEK 02/03/17   [provider]  predniSONE (DELTASONE) 5 MG tablet Take 5 mg daily by mouth. 02/03/17   [provider]    Allergies as of 02/17/2017  . (No Known Allergies)    Family History  Problem Relation Age of Onset  . Colon cancer Brother     Social History   Socioeconomic History  . Marital status: Married    Spouse  name: Not on file  . Number of children: Not on file  . Years of education: Not on file  . Highest education level: Not on file  Social Needs  . Financial resource strain: Patient refused  . Food insecurity - worry: Patient refused  . Food insecurity - inability: Patient refused  . Transportation needs - medical: No  . Transportation needs - non-medical: No  Occupational History  . Not on file  Tobacco Use  . Smoking status: Never Smoker  . Smokeless tobacco: Never Used  Substance and Sexual Activity  . Alcohol use: Yes    Alcohol/week: 2.4 oz    Types: 4 Cans of beer per week  . Drug use: No  . Sexual activity: Not on file  Other Topics Concern  . Not on file  Social History Narrative  . Not on file    Review of Systems: See HPI, otherwise negative ROS  Physical Exam: BP 130/73   Pulse 63   Temp 97.6 F (36.4 C) (Tympanic)   Resp 16   Ht 5\' 9"  (1.753 m)   Wt 60.8 kg (134 lb)   SpO2 100%   BMI 19.79 kg/m  General:   Alert,  pleasant and cooperative in NAD Head:  Normocephalic and atraumatic. Neck:  Supple; no masses or thyromegaly. Lungs:  Clear throughout to auscultation.    Heart:  Regular rate and rhythm. Abdomen:  Soft, nontender and nondistended. Normal bowel sounds, without guarding, and without rebound.   Neurologic:  Alert and  oriented x4;  grossly normal neurologically.  Impression/Plan: Scott Guzman is here for an colonoscopy to be performed for [facility] colon cancer.  Risks, benefits, limitations, and alternatives regarding  colonoscopy have been reviewed with the patient.  Questions have been answered.  All parties agreeable.   Gaylyn Cheers, MD  05/05/2017, 8:14 AM

## 2017-05-05 NOTE — Anesthesia Post-op Follow-up Note (Signed)
Anesthesia QCDR form completed.        

## 2017-05-06 ENCOUNTER — Encounter: Payer: Self-pay | Admitting: Unknown Physician Specialty

## 2017-05-06 NOTE — Anesthesia Postprocedure Evaluation (Signed)
Anesthesia Post Note  Patient: Scott Guzman  Procedure(s) Performed: COLONOSCOPY WITH PROPOFOL (N/A )  Patient location during evaluation: PACU Anesthesia Type: General Level of consciousness: awake and alert Pain management: pain level controlled Vital Signs Assessment: post-procedure vital signs reviewed and stable Respiratory status: spontaneous breathing, nonlabored ventilation, respiratory function stable and patient connected to nasal cannula oxygen Cardiovascular status: blood pressure returned to baseline and stable Postop Assessment: no apparent nausea or vomiting Anesthetic complications: no     Last Vitals:  Vitals:   05/05/17 0903 05/05/17 0913  BP: (!) 169/85 (!) 153/83  Pulse: (!) 52 (!) 50  Resp: 16 15  Temp:    SpO2: 100% 100%    Last Pain:  Vitals:   05/05/17 0843  TempSrc: Tympanic                 Molli Barrows

## 2017-05-09 DIAGNOSIS — R700 Elevated erythrocyte sedimentation rate: Secondary | ICD-10-CM | POA: Diagnosis not present

## 2017-05-12 ENCOUNTER — Other Ambulatory Visit (HOSPITAL_COMMUNITY): Payer: Self-pay | Admitting: Neurology

## 2017-05-12 DIAGNOSIS — G3184 Mild cognitive impairment, so stated: Secondary | ICD-10-CM | POA: Diagnosis not present

## 2017-05-21 ENCOUNTER — Ambulatory Visit (HOSPITAL_COMMUNITY)
Admission: RE | Admit: 2017-05-21 | Discharge: 2017-05-21 | Disposition: A | Discharge status: Home / Self Care | Payer: Medicare Other | Source: Ambulatory Visit | Attending: Neurology | Admitting: Neurology

## 2017-05-21 DIAGNOSIS — R9389 Abnormal findings on diagnostic imaging of other specified body structures: Secondary | ICD-10-CM | POA: Diagnosis not present

## 2017-05-21 DIAGNOSIS — G3184 Mild cognitive impairment, so stated: Secondary | ICD-10-CM | POA: Diagnosis not present

## 2017-05-21 DIAGNOSIS — G9389 Other specified disorders of brain: Secondary | ICD-10-CM | POA: Insufficient documentation

## 2017-05-21 DIAGNOSIS — M2548 Effusion, other site: Secondary | ICD-10-CM | POA: Diagnosis not present

## 2017-06-03 DIAGNOSIS — I4891 Unspecified atrial fibrillation: Secondary | ICD-10-CM | POA: Insufficient documentation

## 2017-06-03 DIAGNOSIS — M316 Other giant cell arteritis: Secondary | ICD-10-CM | POA: Diagnosis not present

## 2017-06-03 DIAGNOSIS — R51 Headache: Secondary | ICD-10-CM | POA: Diagnosis not present

## 2017-06-09 DIAGNOSIS — G9389 Other specified disorders of brain: Secondary | ICD-10-CM | POA: Diagnosis not present

## 2017-06-11 DIAGNOSIS — G9389 Other specified disorders of brain: Secondary | ICD-10-CM | POA: Insufficient documentation

## 2017-06-25 ENCOUNTER — Ambulatory Visit (INDEPENDENT_AMBULATORY_CARE_PROVIDER_SITE_OTHER): Payer: Medicare PPO | Admitting: Family Medicine

## 2017-06-25 ENCOUNTER — Encounter (INDEPENDENT_AMBULATORY_CARE_PROVIDER_SITE_OTHER): Payer: Self-pay | Admitting: Family Medicine

## 2017-06-25 VITALS — BP 135/78 | HR 62 | Temp 98.2°F | Resp 16 | Ht 69.0 in | Wt 135.0 lb

## 2017-06-25 DIAGNOSIS — S61217A Laceration without foreign body of left little finger without damage to nail, initial encounter: Secondary | ICD-10-CM | POA: Diagnosis not present

## 2017-06-25 DIAGNOSIS — Z7901 Long term (current) use of anticoagulants: Secondary | ICD-10-CM | POA: Diagnosis not present

## 2017-06-25 DIAGNOSIS — W268XXA Contact with other sharp object(s), not elsewhere classified, initial encounter: Secondary | ICD-10-CM | POA: Diagnosis not present

## 2017-06-25 DIAGNOSIS — S61219A Laceration without foreign body of unspecified finger without damage to nail, initial encounter: Secondary | ICD-10-CM

## 2017-06-25 DIAGNOSIS — S61209A Unspecified open wound of unspecified finger without damage to nail, initial encounter: Principal | ICD-10-CM

## 2017-06-25 MED ORDER — MUPIROCIN 2 % TOPICAL OINTMENT
TOPICAL_OINTMENT | CUTANEOUS | 0 refills | Status: AC
Start: 2017-06-25 — End: ?

## 2017-06-25 MED ORDER — CEPHALEXIN 500 MG CAPSULE
500.00 mg | ORAL_CAPSULE | Freq: Two times a day (BID) | ORAL | 0 refills | Status: AC
Start: 2017-06-25 — End: 2017-07-02

## 2017-06-25 NOTE — Nursing Note (Signed)
BP 135/78   Pulse 62   Temp 36.8 C (98.2 F) (Oral)   Resp 16   Ht 1.753 m (5\' 9" )   Wt 61.2 kg (135 lb)   BMI 19.94 kg/m     Girtha Rmourtney Dawn Jessenia Filippone, LPN  4/6/96294/05/2017, 52:8414:37

## 2017-06-25 NOTE — Progress Notes (Signed)
[facility]  [facility]  [address] [place] [address]  Dept: [phone]  Dept Fax: [phone]  Loc: [phone]  Loc Fax: [phone]     Patient Name: Carlos Myers  Date of Service: 06/25/17  Date of Birth: 1940/12/10    Chief Complaint: Finger Laceration      HPI: Carlos Myers is a 77 y.o. male who presents today with loss of small portion of pulp of finger(left pinky) that happened about an hour ago when he was taking a shower and it got cut by a razor blade and it took a slice of office tip of the left pinky finger..  There is significant number of bleeding.  He is on blood thinner also.  He did pressure bandage but was not able to control the bleeding.Marland Kitchen    History:  Vital signs and history as obtained by clinical staff.  Past Medical History:   Diagnosis Date   . Atrial fibrillation ([facility])                Family Medical History:     Problem Relation (Age of Onset)    No Known Problems Mother, Father            Social History     Socioeconomic History   . Marital status: Married     Spouse name: Not on file   . Number of children: Not on file   . Years of education: Not on file   . Highest education level: Not on file   Occupational History   . Not on file   Social Needs   . Financial resource strain: Not on file   . Food insecurity:     Worry: Not on file     Inability: Not on file   . Transportation needs:     Medical: Not on file     Non-medical: Not on file   Tobacco Use   . Smoking status: Never Smoker   . Smokeless tobacco: Never Used   Substance and Sexual Activity   . Alcohol use: Not on file   . Drug use: Not on file   . Sexual activity: Not on file   Lifestyle   . Physical activity:     Days per week: Not on file     Minutes per session: Not on file   . Stress: Not on file   Relationships   . Social connections:     Talks on phone: Not on file     Gets together: Not on file     Attends religious service: Not on file     Active member of club or  organization: Not on file     Attends meetings of clubs or organizations: Not on file     Relationship status: Not on file   . Intimate partner violence:     Fear of current or ex partner: Not on file     Emotionally abused: Not on file     Physically abused: Not on file     Forced sexual activity: Not on file   Other Topics Concern   . Not on file   Social History Narrative   . Not on file     Expanded Substance History     Additional history       Allergies:  No Known Allergies  Problem List:  There are no active problems to display for this patient.    Medication:  Outpatient Encounter Medications as of 06/25/2017   Medication Sig Dispense Refill   . amiodarone (PACERONE) 200 mg Oral Tablet TAKE 4 TAB DAILY FOR 5 DAYS, 3 TAB DAILY FOR 5 DAYS, 2 TAB DAILY FOR 5 DAYS AND THEN 1 TAB DAILY.  1   . cephalexin (KEFLEX) 500 mg Oral Capsule Take 1 Cap (500 mg total) by mouth Twice daily for 7 days 14 Cap 0   . mirtazapine (REMERON) 30 mg Oral Tablet TAKE 1 TABLET BY MOUTH AT  BEDTIME     . mupirocin (BACTROBAN) 2 % Ointment Apply a thin layer to effected area BID for 10 days 1 Tube 0   . predniSONE (DELTASONE) 5 mg Oral Tablet      . rosuvastatin (CRESTOR) 20 mg Oral Tablet TAKE 1 TABLET BY MOUTH EVERY DAY  1   . XARELTO 20 mg Oral Tablet        No facility-administered encounter medications on file as of 06/25/2017.        Review of Systems:  Pertinent items are noted in HPI. All pertinent positives and negatives noted in the HPI  Constitutional: No recent illness, no fever, no chills, no night sweats, no weight loss, no loss of appetite.  Neuro: No dizziness, No lightheadedness, No syncope, no hx of seizures  Skin:  Bleeding acute ulcer secondary to laceration from a razor blade..    Exam:  General Vitals: BP 135/78   Pulse 62   Temp 36.8 C (98.2 F) (Oral)   Resp 16   Ht 1.753 m (5\' 9" )   Wt 61.2 kg (135 lb)   BMI 19.94 kg/m       General: acutely ill and mild distress  Eyes: Conjunctivae/corneas clear, PERRLA,  EOM's intact.   Head - normocephalic, atraumatic.  Neck- supple  Skin:  Superficial loss of pulp of left pinky finger at the terminal phalanx area with bleeding.  Neurologic: alert and oriented x3, CN 2-12 grossly intact, B/L upper and lower extremity muscle strength-5/5     Assessment and Plan:    ICD-10-CM    1. Cut of finger S61.209A UC WOUND CLOSURE/REPAIR (AMB ONLY)   2. Laceration of left little finger without foreign body without damage to nail, initial encounter S61.217A UC WOUND CLOSURE/REPAIR (AMB ONLY)     Medication Orders   Medications   . mupirocin (BACTROBAN) 2 % Ointment     Sig: Apply a thin layer to effected area BID for 10 days     Dispense:  1 Tube     Refill:  0   . cephalexin (KEFLEX) 500 mg Oral Capsule     Sig: Take 1 Cap (500 mg total) by mouth Twice daily for 7 days     Dispense:  14 Cap     Refill:  0    Laceration of finger-there is superficial loss of pulp and skin with that with bleeding ulcer.  The area was cauterized after local anesthesia with lidocaine.  Procedure note can be referred to for details.  Will provide antibiotic prophylaxis.    Antibiotics as above, with warnings including allergic reaction, and C. Difficile, both of which can be severe. Patient advised these type of reactions are  unpredictable. Yogurt/probiotics prophylaxis discussed. Pt verbalised understanding to th above plan & verbalised understanding.All questions answered completely.    Plan was discussed and patient/parent verbalized understanding.  If symptoms are not improving within the next couple of days,  advised patient/parent  to followup with primary care  or return to the Urgent Care for further evaluation.  Go to Emergency Department immediately for further work up if worsening symptoms or other medical concerns.    Cristie Hem, MD  06/25/2017, 14:43

## 2017-06-25 NOTE — Procedures (Signed)
After verbal consent from the patient the area was cleaned with normal saline and Betadine.  The patient continued to have bleeding from the site of injury from the razor blade.  Adequate anesthesia was achieved by doing digital block with 2% lidocaine.  After adequate anesthesia the wound was cauterized using Hyfrecator at number 12 setting.  The patient tolerated the procedure well.  Hemostasis was achieved using electrocautery.  Total estimated blood loss until hemostasis about 10 cc.   Location left pinky finger tip volar aspect.  Size and shape-oval, with dimension of 1 cm in longest and about 0.75 cm in small dimension( 1.0 X 0.75 CM)  Cristie HemPriyankar Torri Michalski, MD  06/25/2017, 16:21

## 2017-06-25 NOTE — Patient Instructions (Signed)
[facility], [place]  [place] [address]  Phone: [phone]  Fax: [phone]           Open Daily 8:00am - 8:00pm, except Sundays 12pm-8pm         ~ Closed Thanksgiving and Christmas Day     Attending Caregiver: Jodean Lima, MD    Today's orders: No orders of the defined types were placed in this encounter.       Prescription(s) E-Rx to:      ________________________________________________________________________  Short Term Disability and [facility] does NOT provide assistance with any disability applications.  If you feel your medical condition requires you to be on disability, you will need to follow up with  Your primary care physician or a specialist.  We apologize for any inconvenience.    For Medication Prescribed by [facility]:  As an Urgent Care facility, our clinic does NOT offer prescription refills over the telephone.    If you need more of the medication one of our medical providers prescribed, you will  Either need to be re-evaluated by [place] or see your primary care physician.    ________________________________________________________________________      It is very important that we have a phone number that is the single best way to contact you in the event that we become aware of important clinical information or concerns after your discharge.  If the phone number you provided at registration is NOT this number you should inform staff and registration prior to leaving.      Your treatment and evaluation today was focused on identifying and treating potentially emergent conditions based on your presenting signs, symptoms, and history.  The resulting initial clinical impression and treatment plan is not intended to be definitive or a substitute for a full physical examination and evaluation by your primary care provider.  If your symptoms persist, worsen, or you develop any new or concerning symptoms, you need to be  evaluated.      If you received x-rays during your visit, be aware that the final and formal interpretation of those films by a radiologist may occur after your discharge.  If there is a significant discrepancy identified after your discharge, we will contact you at the telephone number provided at registration.      If you received a pelvic exam, you may have cultures pending for sexually transmitted diseases.  Positive cultures are reported to the [place] Department of Health as required by state law.  You should be contacted if you cultures are positive.  We will not contact you if they are negative.  You did NOT receive a PAP smear (the screening test for cervical).  This specific test for women is best performed by your gynecologist or primary care provider when indicated.      If you are over 74 year old, we cannot discuss your personal health information with a parent, spouse, family member, or anyone else without your express consent.  This does not include those who have legitimate access to your records and information to assist in your care under the provisions of HIPAA ([place] law, or those to whom you have previously given express written consent to do so, such a legal guardian or Power of [place].      You may have received medication that may cause you to feel drowsy and/or light headed for several hours.  You may even  experience some amnesia of your stay.  You should avoid operating a motor vehicle or performing any activity requiring complete alertness or coordination until you feel fully awake (approximately 24-48 hours).  Avoid alcoholic beverages.  You may also have a dry mouth for several hours.  This is a normal side effect and will disappear as the effects of the medication wear off.      Instructions discussed with patient upon discharge by clinical staff with all questions answered.  Please call [facility] [phone] if any further questions.   Go immediately to the emergency department if any concern or worsening symptoms.    Cristie HemPriyankar Gustav Knueppel, MD 06/25/2017, 14:43

## 2017-06-27 ENCOUNTER — Encounter (INDEPENDENT_AMBULATORY_CARE_PROVIDER_SITE_OTHER): Payer: Self-pay | Admitting: Family Medicine

## 2017-06-27 DIAGNOSIS — G919 Hydrocephalus, unspecified: Secondary | ICD-10-CM | POA: Diagnosis not present

## 2017-06-27 NOTE — Nursing Note (Signed)
Called patient for courtesy call to check up from visit, patient is feeling better.   [address] Ma[address] StreetHeather Dawn PaceSell, [place]MA  06/27/2017, 13:58

## 2017-07-01 DIAGNOSIS — M316 Other giant cell arteritis: Secondary | ICD-10-CM | POA: Diagnosis not present

## 2017-07-07 DIAGNOSIS — H25013 Cortical age-related cataract, bilateral: Secondary | ICD-10-CM | POA: Diagnosis not present

## 2017-07-07 DIAGNOSIS — H2513 Age-related nuclear cataract, bilateral: Secondary | ICD-10-CM | POA: Diagnosis not present

## 2017-07-07 DIAGNOSIS — H353132 Nonexudative age-related macular degeneration, bilateral, intermediate dry stage: Secondary | ICD-10-CM | POA: Diagnosis not present

## 2017-07-07 DIAGNOSIS — H35373 Puckering of macula, bilateral: Secondary | ICD-10-CM | POA: Diagnosis not present

## 2017-07-07 DIAGNOSIS — H43393 Other vitreous opacities, bilateral: Secondary | ICD-10-CM | POA: Diagnosis not present

## 2017-07-15 ENCOUNTER — Other Ambulatory Visit: Payer: Self-pay | Admitting: Family Medicine

## 2017-07-15 DIAGNOSIS — S01112A Laceration without foreign body of left eyelid and periocular area, initial encounter: Secondary | ICD-10-CM | POA: Diagnosis not present

## 2017-07-15 NOTE — Telephone Encounter (Signed)
Refill request Remeron  LOV 02/14/2017  Pharmacy on File

## 2017-07-16 DIAGNOSIS — H02844 Edema of left upper eyelid: Secondary | ICD-10-CM | POA: Diagnosis not present

## 2017-07-16 DIAGNOSIS — R519 Headache, unspecified: Secondary | ICD-10-CM | POA: Insufficient documentation

## 2017-07-16 DIAGNOSIS — H16149 Punctate keratitis, unspecified eye: Secondary | ICD-10-CM | POA: Diagnosis not present

## 2017-07-16 DIAGNOSIS — R42 Dizziness and giddiness: Secondary | ICD-10-CM | POA: Diagnosis not present

## 2017-07-16 DIAGNOSIS — I10 Essential (primary) hypertension: Secondary | ICD-10-CM | POA: Insufficient documentation

## 2017-07-16 DIAGNOSIS — R55 Syncope and collapse: Secondary | ICD-10-CM | POA: Insufficient documentation

## 2017-07-16 DIAGNOSIS — I2510 Atherosclerotic heart disease of native coronary artery without angina pectoris: Secondary | ICD-10-CM | POA: Diagnosis not present

## 2017-07-16 DIAGNOSIS — G911 Obstructive hydrocephalus: Secondary | ICD-10-CM | POA: Insufficient documentation

## 2017-07-16 DIAGNOSIS — I4891 Unspecified atrial fibrillation: Secondary | ICD-10-CM | POA: Diagnosis not present

## 2017-07-16 DIAGNOSIS — R269 Unspecified abnormalities of gait and mobility: Secondary | ICD-10-CM | POA: Insufficient documentation

## 2017-07-22 DIAGNOSIS — H25811 Combined forms of age-related cataract, right eye: Secondary | ICD-10-CM | POA: Diagnosis not present

## 2017-07-22 DIAGNOSIS — H2511 Age-related nuclear cataract, right eye: Secondary | ICD-10-CM | POA: Diagnosis not present

## 2017-07-25 DIAGNOSIS — M545 Low back pain: Secondary | ICD-10-CM | POA: Diagnosis not present

## 2017-07-25 DIAGNOSIS — R3129 Other microscopic hematuria: Secondary | ICD-10-CM | POA: Diagnosis not present

## 2017-07-28 DIAGNOSIS — G919 Hydrocephalus, unspecified: Secondary | ICD-10-CM | POA: Diagnosis not present

## 2017-07-28 DIAGNOSIS — R2689 Other abnormalities of gait and mobility: Secondary | ICD-10-CM | POA: Diagnosis not present

## 2017-07-30 DIAGNOSIS — R0781 Pleurodynia: Secondary | ICD-10-CM | POA: Diagnosis not present

## 2017-07-30 DIAGNOSIS — H2511 Age-related nuclear cataract, right eye: Secondary | ICD-10-CM | POA: Diagnosis not present

## 2017-07-31 DIAGNOSIS — M316 Other giant cell arteritis: Secondary | ICD-10-CM | POA: Diagnosis not present

## 2017-08-08 DIAGNOSIS — R51 Headache: Secondary | ICD-10-CM | POA: Diagnosis not present

## 2017-08-08 DIAGNOSIS — R200 Anesthesia of skin: Secondary | ICD-10-CM | POA: Diagnosis not present

## 2017-08-08 DIAGNOSIS — R202 Paresthesia of skin: Secondary | ICD-10-CM | POA: Diagnosis not present

## 2017-08-08 DIAGNOSIS — G9389 Other specified disorders of brain: Secondary | ICD-10-CM | POA: Diagnosis not present

## 2017-08-08 DIAGNOSIS — Z7901 Long term (current) use of anticoagulants: Secondary | ICD-10-CM | POA: Diagnosis not present

## 2017-08-11 DIAGNOSIS — H25012 Cortical age-related cataract, left eye: Secondary | ICD-10-CM | POA: Diagnosis not present

## 2017-08-11 DIAGNOSIS — H2512 Age-related nuclear cataract, left eye: Secondary | ICD-10-CM | POA: Diagnosis not present

## 2017-08-15 DIAGNOSIS — S22000G Wedge compression fracture of unspecified thoracic vertebra, subsequent encounter for fracture with delayed healing: Secondary | ICD-10-CM | POA: Diagnosis not present

## 2017-08-16 DIAGNOSIS — K5900 Constipation, unspecified: Secondary | ICD-10-CM | POA: Diagnosis not present

## 2017-08-19 DIAGNOSIS — H25812 Combined forms of age-related cataract, left eye: Secondary | ICD-10-CM | POA: Diagnosis not present

## 2017-08-19 DIAGNOSIS — H2512 Age-related nuclear cataract, left eye: Secondary | ICD-10-CM | POA: Diagnosis not present

## 2017-08-22 DIAGNOSIS — I4891 Unspecified atrial fibrillation: Secondary | ICD-10-CM | POA: Diagnosis not present

## 2017-08-22 DIAGNOSIS — G3184 Mild cognitive impairment, so stated: Secondary | ICD-10-CM | POA: Diagnosis not present

## 2017-08-22 DIAGNOSIS — R51 Headache: Secondary | ICD-10-CM | POA: Diagnosis not present

## 2017-08-26 ENCOUNTER — Encounter: Payer: Self-pay | Admitting: Family Medicine

## 2017-08-26 ENCOUNTER — Ambulatory Visit (INDEPENDENT_AMBULATORY_CARE_PROVIDER_SITE_OTHER): Payer: Medicare Other | Admitting: Family Medicine

## 2017-08-26 VITALS — BP 146/84 | HR 57 | Temp 97.9°F | Wt 132.0 lb

## 2017-08-26 DIAGNOSIS — R42 Dizziness and giddiness: Secondary | ICD-10-CM

## 2017-08-26 DIAGNOSIS — H918X2 Other specified hearing loss, left ear: Secondary | ICD-10-CM

## 2017-08-26 LAB — CBC WITH DIFFERENTIAL/PLATELET
HEMATOCRIT: 40.5 % (ref 37.5–51.0)
HEMOGLOBIN: 13.5 g/dL (ref 13.0–17.7)
LYMPHS ABS: 1.1 10*3/uL (ref 0.7–3.1)
Lymphs: 18 %
MCH: 30 pg (ref 26.6–33.0)
MCHC: 33.3 g/dL (ref 31.5–35.7)
MCV: 90 fL (ref 79–97)
MID (ABSOLUTE): 0.6 10*3/uL (ref 0.1–1.6)
MID: 10 %
Neutrophils Absolute: 4.4 10*3/uL (ref 1.4–7.0)
Neutrophils: 71 %
Platelets: 211 10*3/uL (ref 150–450)
RBC: 4.5 x10E6/uL (ref 4.14–5.80)
RDW: 14.7 % (ref 12.3–15.4)
WBC: 6.1 10*3/uL (ref 3.4–10.8)

## 2017-08-26 NOTE — Patient Instructions (Signed)
Orthostatic Hypotension Orthostatic hypotension is a sudden drop in blood pressure that happens when you quickly change positions, such as when you get up from a seated or lying position. Blood pressure is a measurement of how strongly, or weakly, your blood is pressing against the walls of your arteries. Arteries are blood vessels that carry blood from your heart throughout your body. When blood pressure is too low, you may not get enough blood to your brain or to the rest of your organs. This can cause weakness, light-headedness, rapid heartbeat, and fainting. This can last for just a few seconds or for up to a few minutes. Orthostatic hypotension is usually not a serious problem. However, if it happens frequently or gets worse, it may be a sign of something more serious. What are the causes? This condition may be caused by:  Sudden changes in posture, such as standing up quickly after you have been sitting or lying down.  Blood loss.  Loss of body fluids (dehydration).  Heart problems.  Hormone (endocrine) problems.  Pregnancy.  Severe infection.  Lack of certain nutrients.  Severe allergic reactions (anaphylaxis).  Certain medicines, such as blood pressure medicine or medicines that make the body lose excess fluids (diuretics). Sometimes, this condition can be caused by not taking medicine as directed, such as taking too much of a certain medicine.  What increases the risk? Certain factors can make you more likely to develop orthostatic hypotension, including:  Age. Risk increases as you get older.  Conditions that affect the heart or the central nervous system.  Taking certain medicines, such as blood pressure medicine or diuretics.  Being pregnant.  What are the signs or symptoms? Symptoms of this condition may include:  Weakness.  Light-headedness.  Dizziness.  Blurred vision.  Fatigue.  Rapid heartbeat.  Fainting, in severe cases.  How is this  diagnosed? This condition is diagnosed based on:  Your medical history.  Your symptoms.  Your blood pressure measurement. Your health care provider will check your blood pressure when you are: ? Lying down. ? Sitting. ? Standing.  A blood pressure reading is recorded as two numbers, such as "120 over 80" (or 120/80). The first ("top") number is called the systolic pressure. It is a measure of the pressure in your arteries as your heart beats. The second ("bottom") number is called the diastolic pressure. It is a measure of the pressure in your arteries when your heart relaxes between beats. Blood pressure is measured in a unit called mm Hg. Healthy blood pressure for adults is 120/80. If your blood pressure is below 90/60, you may be diagnosed with hypotension. Other information or tests that may be used to diagnose orthostatic hypotension include:  Your other vital signs, such as your heart rate and temperature.  Blood tests.  Tilt table test. For this test, you will be safely secured to a table that moves you from a lying position to an upright position. Your heart rhythm and blood pressure will be monitored during the test.  How is this treated? Treatment for this condition may include:  Changing your diet. This may involve eating more salt (sodium) or drinking more water.  Taking medicines to raise your blood pressure.  Changing the dosage of certain medicines you are taking that might be lowering your blood pressure.  Wearing compression stockings. These stockings help to prevent blood clots and reduce swelling in your legs.  In some cases, you may need to go to the hospital for:    Fluid replacement. This means you will receive fluids through an IV tube.  Blood replacement. This means you will receive donated blood through an IV tube (transfusion).  Treating an infection or heart problems, if this applies.  Monitoring. You may need to be monitored while medicines that you  are taking wear off.  Follow these instructions at home: Eating and drinking   Drink enough fluid to keep your urine clear or pale yellow.  Eat a healthy diet and follow instructions from your health care provider about eating or drinking restrictions. A healthy diet includes: ? Fresh fruits and vegetables. ? Whole grains. ? Lean meats. ? Low-fat dairy products.  Eat extra salt only as directed. Do not add extra salt to your diet unless your health care provider told you to do that.  Eat frequent, small meals.  Avoid standing up suddenly after eating. Medicines  Take over-the-counter and prescription medicines only as told by your health care provider. ? Follow instructions from your health care provider about changing the dosage of your current medicines, if this applies. ? Do not stop or adjust any of your medicines on your own. General instructions  Wear compression stockings as told by your health care provider.  Get up slowly from lying down or sitting positions. This gives your blood pressure a chance to adjust.  Avoid hot showers and excessive heat as directed by your health care provider.  Return to your normal activities as told by your health care provider. Ask your health care provider what activities are safe for you.  Do not use any products that contain nicotine or tobacco, such as cigarettes and e-cigarettes. If you need help quitting, ask your health care provider.  Keep all follow-up visits as told by your health care provider. This is important. Contact a health care provider if:  You vomit.  You have diarrhea.  You have a fever for more than 2-3 days.  You feel more thirsty than usual.  You feel weak and tired. Get help right away if:  You have chest pain.  You have a fast or irregular heartbeat.  You develop numbness in any part of your body.  You cannot move your arms or your legs.  You have trouble speaking.  You become sweaty or feel  lightheaded.  You faint.  You feel short of breath.  You have trouble staying awake.  You feel confused. This information is not intended to replace advice given to you by your health care provider. Make sure you discuss any questions you have with your health care provider. Document Released: 03/01/2002 Document Revised: 11/28/2015 Document Reviewed: 09/01/2015 Elsevier Interactive Patient Education  2018 Elsevier Inc.  

## 2017-08-26 NOTE — Progress Notes (Signed)
BP (!) 146/84 (BP Location: Left Arm, Patient Position: Sitting, Cuff Size: Normal)   Pulse (!) 57   Temp 97.9 F (36.6 C)   Wt 132 lb (59.9 kg)   SpO2 100%   BMI 19.49 kg/m    Subjective:    Patient ID: Scott Guzman, male    DOB: Dec 24, 1940, 77 y.o.   MRN: [id number]  HPI: Scott Guzman is a 77 y.o. male  Chief Complaint  Patient presents with  . vertigo  . ears clogged   Recently diagnosed with A. Fib. Has been started on coumadin.  Has been working with neurology being worked up for normal pressure hydrocephalus vs hydrocephalus. Has been working with PT= didn't have NPH. Just had a LP 2 weeks ago that R/O'd hydrocephalus  DIZZINESS Duration: months, worse a couple of weeks ago Description of symptoms: lightheaded Duration of episode: seconds Dizziness frequency: recurrent, 1x a week Provoking factors: Going from sitting to standing Aggravating factors:  Going from sitting to standing Triggered by rolling over in bed: no Triggered by bending over: no Aggravated by head movement: no Aggravated by exertion, coughing, loud noises: no Recent head injury: no Recent or current viral symptoms: no History of vasovagal episodes: no Nausea: no Vomiting: no Tinnitus: yes- L ear Hearing loss: yes- L ear in the last couple of month Aural fullness: yes Headache: no Photophobia/phonophobia: no Unsteady gait: no Postural instability: no Diplopia, dysarthria, dysphagia or weakness: no Related to exertion: no Pallor: no Diaphoresis: no Dyspnea: no Chest pain: no  Relevant past medical, surgical, family and social history reviewed and updated as indicated. Interim medical history since our last visit reviewed. Allergies and medications reviewed and updated.  Review of Systems  Constitutional: Negative.   Respiratory: Negative.   Cardiovascular: Negative.   Gastrointestinal: Negative.   Neurological: Positive for dizziness and light-headedness. Negative for tremors,  seizures, syncope, facial asymmetry, speech difficulty, weakness, numbness and headaches.  Psychiatric/Behavioral: Negative.     Per HPI unless specifically indicated above     Objective:    BP (!) 146/84 (BP Location: Left Arm, Patient Position: Sitting, Cuff Size: Normal)   Pulse (!) 57   Temp 97.9 F (36.6 C)   Wt 132 lb (59.9 kg)   SpO2 100%   BMI 19.49 kg/m   Wt Readings from Last 3 Encounters:  08/26/17 132 lb (59.9 kg)  05/05/17 134 lb (60.8 kg)  02/20/17 129 lb (58.5 kg)    Orthostatic VS for the past 24 hrs:  BP- Lying Pulse- Lying BP- Sitting Pulse- Sitting BP- Standing at 0 minutes Pulse- Standing at 0 minutes  08/26/17 0834 166/73 54 145/80 55 116/70 61    Physical Exam  Constitutional: He is oriented to person, place, and time. He appears well-developed and well-nourished. No distress.  HENT:  Head: Normocephalic and atraumatic.  Right Ear: Hearing and external ear normal.  Left Ear: Hearing and external ear normal.  Nose: Nose normal.  Mouth/Throat: Oropharynx is clear and moist. No oropharyngeal exudate.  Eyes: Pupils are equal, round, and reactive to light. Conjunctivae, EOM and lids are normal. Right eye exhibits no discharge. Left eye exhibits no discharge. No scleral icterus.  Neck: Normal range of motion. Neck supple. No JVD present. No tracheal deviation present. No thyromegaly present.  Cardiovascular: Normal rate, regular rhythm, normal heart sounds and intact distal pulses. Exam reveals no gallop and no friction rub.  No murmur heard. Pulmonary/Chest: Effort normal and breath sounds normal. No stridor. No  respiratory distress. He has no wheezes. He has no rales. He exhibits no tenderness.  Musculoskeletal: Normal range of motion.  Lymphadenopathy:    He has no cervical adenopathy.  Neurological: He is alert and oriented to person, place, and time.  Skin: Skin is warm, dry and intact. Capillary refill takes less than 2 seconds. No rash noted. He is  not diaphoretic. No erythema. No pallor.  Psychiatric: He has a normal mood and affect. His speech is normal and behavior is normal. Judgment and thought content normal. Cognition and memory are normal.  Nursing note and vitals reviewed.   Results for orders placed or performed in visit on 05/08/17  HM COLONOSCOPY  Result Value Ref Range   HM Colonoscopy See Report (in chart) See Report (in chart), Patient Reported      Assessment & Plan:   Problem List Items Addressed This Visit    None    Visit Diagnoses    Dizziness    -  Primary   Seems to be due to orthostasis. CBC good. Increase fluids. Call with any concerns.    Relevant Orders   CBC With Differential/Platelet   Ambulatory referral to ENT   Other specified hearing loss of left ear, unspecified hearing status on contralateral side       Will get him into see ENT. Referral generated today. Call with any concerns.    Relevant Orders   Ambulatory referral to ENT       Follow up plan: Return if symptoms worsen or fail to improve.

## 2017-08-27 DIAGNOSIS — H2512 Age-related nuclear cataract, left eye: Secondary | ICD-10-CM | POA: Diagnosis not present

## 2017-09-04 DIAGNOSIS — M316 Other giant cell arteritis: Secondary | ICD-10-CM | POA: Diagnosis not present

## 2017-09-05 DIAGNOSIS — H90A32 Mixed conductive and sensorineural hearing loss, unilateral, left ear with restricted hearing on the contralateral side: Secondary | ICD-10-CM | POA: Diagnosis not present

## 2017-09-05 DIAGNOSIS — R42 Dizziness and giddiness: Secondary | ICD-10-CM | POA: Diagnosis not present

## 2017-09-05 DIAGNOSIS — H908 Mixed conductive and sensorineural hearing loss, unspecified: Secondary | ICD-10-CM | POA: Diagnosis not present

## 2017-09-05 DIAGNOSIS — H6500 Acute serous otitis media, unspecified ear: Secondary | ICD-10-CM | POA: Diagnosis not present

## 2017-09-23 DIAGNOSIS — R0602 Shortness of breath: Secondary | ICD-10-CM | POA: Diagnosis not present

## 2017-09-23 DIAGNOSIS — R55 Syncope and collapse: Secondary | ICD-10-CM | POA: Diagnosis not present

## 2017-09-23 DIAGNOSIS — I10 Essential (primary) hypertension: Secondary | ICD-10-CM | POA: Diagnosis not present

## 2017-09-23 DIAGNOSIS — I4891 Unspecified atrial fibrillation: Secondary | ICD-10-CM | POA: Diagnosis not present

## 2017-10-06 DIAGNOSIS — H6982 Other specified disorders of Eustachian tube, left ear: Secondary | ICD-10-CM | POA: Diagnosis not present

## 2017-10-06 DIAGNOSIS — H902 Conductive hearing loss, unspecified: Secondary | ICD-10-CM | POA: Diagnosis not present

## 2017-10-06 DIAGNOSIS — H6522 Chronic serous otitis media, left ear: Secondary | ICD-10-CM | POA: Diagnosis not present

## 2017-10-07 ENCOUNTER — Other Ambulatory Visit: Payer: Self-pay | Admitting: Family Medicine

## 2017-10-07 DIAGNOSIS — R55 Syncope and collapse: Secondary | ICD-10-CM | POA: Diagnosis not present

## 2017-10-07 DIAGNOSIS — R0602 Shortness of breath: Secondary | ICD-10-CM | POA: Diagnosis not present

## 2017-10-07 DIAGNOSIS — I4891 Unspecified atrial fibrillation: Secondary | ICD-10-CM | POA: Diagnosis not present

## 2017-10-07 NOTE — Telephone Encounter (Signed)
Remeron 30 mg refill request  LOV 08/26/17 with Dr. Wynetta Emery  PCP:  Dr. Jeananne Rama  Last refill:  07/15/17    #90   Refills 0  [facility] mail service - [place], [place]

## 2017-10-08 DIAGNOSIS — M316 Other giant cell arteritis: Secondary | ICD-10-CM | POA: Diagnosis not present

## 2017-10-10 DIAGNOSIS — I951 Orthostatic hypotension: Secondary | ICD-10-CM | POA: Diagnosis not present

## 2017-10-10 DIAGNOSIS — R42 Dizziness and giddiness: Secondary | ICD-10-CM | POA: Diagnosis not present

## 2017-10-27 DIAGNOSIS — H6522 Chronic serous otitis media, left ear: Secondary | ICD-10-CM | POA: Diagnosis not present

## 2017-12-24 ENCOUNTER — Ambulatory Visit: Payer: Medicare Other | Admitting: Family Medicine

## 2017-12-24 ENCOUNTER — Encounter: Payer: Self-pay | Admitting: Family Medicine

## 2017-12-24 VITALS — BP 132/74 | HR 68 | Wt 132.3 lb

## 2017-12-24 DIAGNOSIS — K5900 Constipation, unspecified: Secondary | ICD-10-CM | POA: Diagnosis not present

## 2017-12-24 MED ORDER — LINACLOTIDE 72 MCG PO CAPS: 72.0000 ug | ORAL_CAPSULE | Freq: Every day | ORAL | 2 refills | Status: DC

## 2017-12-24 NOTE — Progress Notes (Signed)
BP 132/74   Pulse 68   Wt 132 lb 4.8 oz (60 kg)   SpO2 99%   BMI 19.54 kg/m    Subjective:    Patient ID: Scott Guzman, male    DOB: 1940/08/20, 77 y.o.   MRN: [id number]  HPI: ALASDAIR KLEVE is a 77 y.o. male  Chief Complaint  Patient presents with  . Constipation    Been ongoing issue x 1 year. Pt has not had BM x 3 days w/ current flair. Pt had colonoscopy done in Feb w/ normal results.    Here today for constipation. Long hx of this, currently 3 days since last BM. No abdominal pain, rectal pain, weight loss, fevers, anorexia.  Will have to take high doses of miralax and dulcolax usually to "break things loose". Seems to be getting worse over time. Hx of colon cancer. Normal colonoscopy in February 2019. Drinking about 16 ounces of water daily and not on any fiber supplements.   Relevant past medical, surgical, family and social history reviewed and updated as indicated. Interim medical history since our last visit reviewed. Allergies and medications reviewed and updated.  Review of Systems  Per HPI unless specifically indicated above     Objective:    BP 132/74   Pulse 68   Wt 132 lb 4.8 oz (60 kg)   SpO2 99%   BMI 19.54 kg/m   Wt Readings from Last 3 Encounters:  01/12/18 135 lb 4.8 oz (61.4 kg)  12/24/17 132 lb 4.8 oz (60 kg)  08/26/17 132 lb (59.9 kg)    Physical Exam  Constitutional: He is oriented to person, place, and time. He appears well-developed.  Underweight  HENT:  Head: Atraumatic.  Eyes: Conjunctivae and EOM are normal.  Neck: Normal range of motion. Neck supple.  Cardiovascular: Normal rate and regular rhythm.  Pulmonary/Chest: Effort normal and breath sounds normal.  Abdominal: Soft. Bowel sounds are normal. There is no tenderness. There is no guarding.  Musculoskeletal: Normal range of motion.  Neurological: He is alert and oriented to person, place, and time.  Skin: Skin is warm and dry.  Psychiatric: He has a normal mood and affect. His  behavior is normal. Thought content normal.  Nursing note and vitals reviewed.   Results for orders placed or performed in visit on 08/26/17  CBC With Differential/Platelet  Result Value Ref Range   WBC 6.1 3.4 - 10.8 x10E3/uL   RBC 4.50 4.14 - 5.80 x10E6/uL   Hemoglobin 13.5 13.0 - 17.7 g/dL   Hematocrit 40.5 37.5 - 51.0 %   MCV 90 79 - 97 fL   MCH 30.0 26.6 - 33.0 pg   MCHC 33.3 31.5 - 35.7 g/dL   RDW 14.7 12.3 - 15.4 %   Platelets 211 150 - 450 x10E3/uL   Neutrophils 71 Not Estab. %   Lymphs 18 Not Estab. %   MID 10 Not Estab. %   Neutrophils Absolute 4.4 1.4 - 7.0 x10E3/uL   Lymphocytes Absolute 1.1 0.7 - 3.1 x10E3/uL   MID (Absolute) 0.6 0.1 - 1.6 X10E3/uL      Assessment & Plan:   Problem List Items Addressed This Visit    None    Visit Diagnoses    Constipation, unspecified constipation type    -  Primary   Will trial linzess as OTC remedies are not regulating his BMs well. Increase fluids, start probiotics, fiber supplements. F/u if not improving.        Follow  up plan: Return for as scheduled.

## 2017-12-31 ENCOUNTER — Other Ambulatory Visit: Payer: Self-pay

## 2017-12-31 NOTE — Patient Outreach (Signed)
[facility]) Care Management  12/31/2017  DUSTY RACZKOWSKI 06-08-1940 [id number]   Medication Adherence call to Mr. [name] spoke with patient he said doctor took him of Lisinopril 10 mg he is no longer taking this medication.Mr. Miley is showing past due under [place].   [facility] Management Direct Dial [phone]  Fax [phone] [email or web address]

## 2018-01-12 ENCOUNTER — Ambulatory Visit (INDEPENDENT_AMBULATORY_CARE_PROVIDER_SITE_OTHER): Payer: Medicare Other | Admitting: Family Medicine

## 2018-01-12 ENCOUNTER — Encounter: Payer: Self-pay | Admitting: Family Medicine

## 2018-01-12 VITALS — BP 129/83 | HR 64 | Temp 97.7°F | Wt 135.3 lb

## 2018-01-12 DIAGNOSIS — R82998 Other abnormal findings in urine: Secondary | ICD-10-CM

## 2018-01-12 DIAGNOSIS — R809 Proteinuria, unspecified: Secondary | ICD-10-CM | POA: Diagnosis not present

## 2018-01-12 DIAGNOSIS — R8290 Unspecified abnormal findings in urine: Secondary | ICD-10-CM | POA: Diagnosis not present

## 2018-01-12 LAB — MICROSCOPIC EXAMINATION
RENAL EPITHEL UA: NONE SEEN /HPF
WBC, UA: NONE SEEN /hpf (ref 0–5)

## 2018-01-12 LAB — UA/M W/RFLX CULTURE, ROUTINE
Bilirubin, UA: NEGATIVE
Glucose, UA: NEGATIVE
Ketones, UA: NEGATIVE
LEUKOCYTES UA: NEGATIVE
NITRITE UA: NEGATIVE
Specific Gravity, UA: 1.025 (ref 1.005–1.030)
Urobilinogen, Ur: 0.2 mg/dL (ref 0.2–1.0)
pH, UA: 5.5 (ref 5.0–7.5)

## 2018-01-12 NOTE — Progress Notes (Signed)
BP 129/83   Pulse 64   Temp 97.7 F (36.5 C) (Oral)   Wt 135 lb 4.8 oz (61.4 kg)   SpO2 99%   BMI 19.98 kg/m    Subjective:    Patient ID: Scott Guzman, male    DOB: [birth year], 77 y.o.   MRN: [id number]  HPI: Scott Guzman is a 77 y.o. male  Chief Complaint  Patient presents with  . Urine Issue    pt states he has had foamy, bubbly urine for the past week    URINARY SYMPTOMS- has been noticing that he has been having foamy- whitish foam for about the last week that will become a film if allowed to sit Duration: 1+ week Dysuria: no Urinary frequency: no Urgency: no Small volume voids: no Symptom severity: moderate Urinary incontinence: no Foul odor: no Hematuria: no Abdominal pain: no Back pain: no Suprapubic pain/pressure: no Flank pain: no Fever:  no Vomiting: no Relief with cranberry juice: no Relief with pyridium: no Status: stable Previous urinary tract infection: no Recurrent urinary tract infection: no Sexual activity: monogomous History of sexually transmitted disease: no Penile discharge: no Treatments attempted: increasing fluids   Relevant past medical, surgical, family and social history reviewed and updated as indicated. Interim medical history since our last visit reviewed. Allergies and medications reviewed and updated.  Review of Systems  Constitutional: Negative.   Respiratory: Negative.   Cardiovascular: Negative.   Gastrointestinal: Negative.   Genitourinary: Positive for difficulty urinating. Negative for decreased urine volume, discharge, dysuria, enuresis, flank pain, frequency, genital sores, hematuria, penile pain, penile swelling, scrotal swelling, testicular pain and urgency.  Neurological: Negative.   Psychiatric/Behavioral: Negative.     Per HPI unless specifically indicated above     Objective:    BP 129/83   Pulse 64   Temp 97.7 F (36.5 C) (Oral)   Wt 135 lb 4.8 oz (61.4 kg)   SpO2 99%   BMI 19.98 kg/m   Wt  Readings from Last 3 Encounters:  01/12/18 135 lb 4.8 oz (61.4 kg)  12/24/17 132 lb 4.8 oz (60 kg)  08/26/17 132 lb (59.9 kg)    Physical Exam  Constitutional: He is oriented to person, place, and time. He appears well-developed and well-nourished. No distress.  HENT:  Head: Normocephalic and atraumatic.  Right Ear: Hearing normal.  Left Ear: Hearing normal.  Nose: Nose normal.  Eyes: Conjunctivae and lids are normal. Right eye exhibits no discharge. Left eye exhibits no discharge. No scleral icterus.  Cardiovascular: Normal rate, regular rhythm, normal heart sounds and intact distal pulses. Exam reveals no gallop and no friction rub.  No murmur heard. Pulmonary/Chest: Effort normal and breath sounds normal. No stridor. No respiratory distress. He has no wheezes. He has no rales. He exhibits no tenderness.  Abdominal: Soft. Bowel sounds are normal. He exhibits no distension and no mass. There is no tenderness. There is no rebound and no guarding. No hernia.  Musculoskeletal: Normal range of motion.  Neurological: He is alert and oriented to person, place, and time.  Skin: Skin is warm, dry and intact. Capillary refill takes less than 2 seconds. No rash noted. He is not diaphoretic. No erythema. No pallor.  Psychiatric: He has a normal mood and affect. His speech is normal and behavior is normal. Judgment and thought content normal. Cognition and memory are normal.  Nursing note and vitals reviewed.   Results for orders placed or performed in visit on 08/26/17  CBC With  Differential/Platelet  Result Value Ref Range   WBC 6.1 3.4 - 10.8 x10E3/uL   RBC 4.50 4.14 - 5.80 x10E6/uL   Hemoglobin 13.5 13.0 - 17.7 g/dL   Hematocrit 40.5 37.5 - 51.0 %   MCV 90 79 - 97 fL   MCH 30.0 26.6 - 33.0 pg   MCHC 33.3 31.5 - 35.7 g/dL   RDW 14.7 12.3 - 15.4 %   Platelets 211 150 - 450 x10E3/uL   Neutrophils 71 Not Estab. %   Lymphs 18 Not Estab. %   MID 10 Not Estab. %   Neutrophils Absolute 4.4  1.4 - 7.0 x10E3/uL   Lymphocytes Absolute 1.1 0.7 - 3.1 x10E3/uL   MID (Absolute) 0.6 0.1 - 1.6 X10E3/uL      Assessment & Plan:   Problem List Items Addressed This Visit    None    Visit Diagnoses    Frothy urine    -  Primary   Will check labs. 1+ protein on urine. Await results. Increase fluid intake. Call with any concerns.    Relevant Orders   CBC with Differential/Platelet   Comprehensive metabolic panel   Abnormal urine       Checking labs. Await results. Call with any concerns.    Relevant Orders   UA/M w/rflx Culture, Routine   Urine Culture   Proteinuria, unspecified type       Checking labs. Await results. Call with any concerns.        Follow up plan: Return if symptoms worsen or fail to improve.

## 2018-01-12 NOTE — Patient Instructions (Signed)
Proteinuria Proteinuria is when there is too much protein in the urine. Proteins are important for buildingmuscles and bones. Proteins are also needed to fight infections, help the blood clot, and keep body fluids in balance. Proteinuria may be mild and temporary, or it may be an early sign of kidney disease. The kidneys make urine. Healthy kidneys also keep substances like proteins from leaving the blood and ending up in the urine. Proteinuria may be a sign that the kidneys are not working well. What are the causes? Healthy kidneys have filters (glomeruli) that keep proteins out of the urine. Proteinuria may mean that the glomeruli are damaged. The main causes of this type of damage are:  Diabetes.  High blood pressure.  Other causes of kidney damage can also cause proteinuria, such as:  Diseases of the immune system, such as lupus, rheumatoid arthritis, sarcoidosis, and Goodpasture syndrome.  Heart disease or heart failure.  Kidney infection.  Certain cancers, including kidney cancer, lymphoma, leukemia, and multiple myeloma.  Amyloidosis. This is a disease that causes abnormal proteins to build up in body tissues.  Reactions to certain medicines, such as NSAIDs.  Injury (trauma) or poisons (toxins).  High blood pressure that occurs during pregnancy (preeclampsia).  Temporary proteinuria may result from conditions that put stress on the kidneys. These conditions usually do not cause kidney damage. They include:  Fever.  Exposure to cold or heat.  Emotional or physical stress.  Extreme exercise.  Standing for long periods of time.  What increases the risk? This condition is more likely to develop in people who:  Have diabetes.  Have high blood pressure.  Have heart disease or heart failure.  Have an immune disease, cancer, or other disease that affects the kidneys.  Have a family history of kidney disease.  Are 65 or older.  Are overweight.  Are of  African-American, American [place], Hispanic/Latino, or [place] descent.  Are pregnant.  Have an infection.  What are the signs or symptoms? Mild proteinuria may not cause symptoms. As more proteins enter the urine, symptoms of kidney disease may develop, such as:  Foamy urine.  Swelling of the face, abdomen, hands, legs, or feet (edema).  Needing to urinate frequently.  Fatigue.  Difficulty sleeping.  Dry and itchy skin.  Nausea and vomiting.  Muscle cramps.  Shortness of breath.  How is this diagnosed? Your health care provider can diagnose proteinuria with a urine test. You may have this test as part of a routine physical or because you have symptoms of kidney disease or risk factors for kidney disease. You may also have:  Blood tests to measure the level of a certain substance (creatinine) that increases with kidney disease.  Imaging tests of your kidney, such as a CT scan or an ultrasound, to look for signs of kidney damage.  How is this treated? If your proteinuria is mild or temporary, no treatment may be needed. Your health care provider may show you how to monitor the level of protein in your urine at home. Identifying proteinuria early is important so that the cause of the condition can be treated. Treatment depends on the cause of your proteinuria. Treatment may include:  Making diet and lifestyle changes.  Getting blood pressure under control.  Getting blood sugar under control, if you have diabetes.  Managing any other medical conditions you have that affect your kidneys.  Giving birth, if you are pregnant.  Avoiding medicines that damage your kidneys.  Treating kidney disease with medicine  and dialysis, as needed.  Follow these instructions at home:  Check your protein levels at home, if directed by your health care provider.  Follow instructions from your health care provider about eating or drinking restrictions.  Take over-the-counter  and prescription medicines only as told by your health care provider.  Return to your normal activities as told by your health care provider. Ask your health care provider what activities are safe for you.  If you are overweight, ask your health care provider to help you with a diet to get to a healthy weight.  Ask your health care provider to help you with an exercise program.  Keep all follow-up visits as told by your health care provider. This is important. Contact a health care provider if:  You have new symptoms.  Your symptoms get worse or do not improve. Get help right away if:  You have back pain.  You have diarrhea.  You vomit.  You have a fever.  You have a rash. This information is not intended to replace advice given to you by your health care provider. Make sure you discuss any questions you have with your health care provider. Document Released: 05/01/2005 Document Revised: 04/21/2015 Document Reviewed: 01/30/2015 Elsevier Interactive Patient Education  2018 Elsevier Inc.  

## 2018-01-13 ENCOUNTER — Telehealth: Payer: Self-pay | Admitting: Family Medicine

## 2018-01-13 DIAGNOSIS — N289 Disorder of kidney and ureter, unspecified: Secondary | ICD-10-CM

## 2018-01-13 LAB — CBC WITH DIFFERENTIAL/PLATELET
Basophils Absolute: 0.1 10*3/uL (ref 0.0–0.2)
Basos: 1 %
EOS (ABSOLUTE): 0.2 10*3/uL (ref 0.0–0.4)
EOS: 5 %
HEMATOCRIT: 40.9 % (ref 37.5–51.0)
HEMOGLOBIN: 13.3 g/dL (ref 13.0–17.7)
Immature Grans (Abs): 0 10*3/uL (ref 0.0–0.1)
Immature Granulocytes: 0 %
LYMPHS ABS: 1.1 10*3/uL (ref 0.7–3.1)
Lymphs: 20 %
MCH: 29.5 pg (ref 26.6–33.0)
MCHC: 32.5 g/dL (ref 31.5–35.7)
MCV: 91 fL (ref 79–97)
MONOCYTES: 9 %
MONOS ABS: 0.5 10*3/uL (ref 0.1–0.9)
NEUTROS ABS: 3.5 10*3/uL (ref 1.4–7.0)
Neutrophils: 65 %
Platelets: 204 10*3/uL (ref 150–450)
RBC: 4.51 x10E6/uL (ref 4.14–5.80)
RDW: 12.2 % — AB (ref 12.3–15.4)
WBC: 5.4 10*3/uL (ref 3.4–10.8)

## 2018-01-13 LAB — COMPREHENSIVE METABOLIC PANEL
A/G RATIO: 1.8 (ref 1.2–2.2)
ALBUMIN: 4 g/dL (ref 3.5–4.8)
ALK PHOS: 86 IU/L (ref 39–117)
ALT: 22 IU/L (ref 0–44)
AST: 26 IU/L (ref 0–40)
BILIRUBIN TOTAL: 0.4 mg/dL (ref 0.0–1.2)
BUN / CREAT RATIO: 15 (ref 10–24)
BUN: 23 mg/dL (ref 8–27)
CHLORIDE: 104 mmol/L (ref 96–106)
CO2: 23 mmol/L (ref 20–29)
Calcium: 9.1 mg/dL (ref 8.6–10.2)
Creatinine, Ser: 1.58 mg/dL — ABNORMAL HIGH (ref 0.76–1.27)
GFR calc Af Amer: 49 mL/min/{1.73_m2} — ABNORMAL LOW (ref >59)
GFR calc non Af Amer: 42 mL/min/{1.73_m2} — ABNORMAL LOW (ref >59)
GLOBULIN, TOTAL: 2.2 g/dL (ref 1.5–4.5)
Glucose: 98 mg/dL (ref 65–99)
POTASSIUM: 4.5 mmol/L (ref 3.5–5.2)
SODIUM: 142 mmol/L (ref 134–144)
Total Protein: 6.2 g/dL (ref 6.0–8.5)

## 2018-01-13 NOTE — Telephone Encounter (Signed)
Patient notified

## 2018-01-13 NOTE — Telephone Encounter (Signed)
Please let him know that his kidney function went up a bit- I'd like him to drink a whole lot of extra water and come back in in about a week for [place] to recheck it. Thanks! (order in) His blood count was normal.

## 2018-01-14 LAB — URINE CULTURE

## 2018-01-17 NOTE — Patient Instructions (Signed)
Follow up as scheduled.  

## 2018-01-22 DIAGNOSIS — I4891 Unspecified atrial fibrillation: Secondary | ICD-10-CM | POA: Diagnosis not present

## 2018-01-22 DIAGNOSIS — I2510 Atherosclerotic heart disease of native coronary artery without angina pectoris: Secondary | ICD-10-CM | POA: Diagnosis not present

## 2018-01-22 DIAGNOSIS — R0602 Shortness of breath: Secondary | ICD-10-CM | POA: Diagnosis not present

## 2018-02-04 ENCOUNTER — Other Ambulatory Visit: Payer: Medicare Other

## 2018-02-04 DIAGNOSIS — N289 Disorder of kidney and ureter, unspecified: Secondary | ICD-10-CM | POA: Diagnosis not present

## 2018-02-05 LAB — BASIC METABOLIC PANEL
BUN/Creatinine Ratio: 13 (ref 10–24)
BUN: 18 mg/dL (ref 8–27)
CALCIUM: 9 mg/dL (ref 8.6–10.2)
CHLORIDE: 102 mmol/L (ref 96–106)
CO2: 26 mmol/L (ref 20–29)
Creatinine, Ser: 1.44 mg/dL — ABNORMAL HIGH (ref 0.76–1.27)
GFR calc Af Amer: 55 mL/min/{1.73_m2} — ABNORMAL LOW (ref >59)
GFR calc non Af Amer: 47 mL/min/{1.73_m2} — ABNORMAL LOW (ref >59)
GLUCOSE: 91 mg/dL (ref 65–99)
Potassium: 4.5 mmol/L (ref 3.5–5.2)
Sodium: 142 mmol/L (ref 134–144)

## 2018-02-06 ENCOUNTER — Telehealth: Payer: Self-pay | Admitting: Family Medicine

## 2018-02-06 DIAGNOSIS — N289 Disorder of kidney and ureter, unspecified: Secondary | ICD-10-CM

## 2018-02-06 DIAGNOSIS — H26493 Other secondary cataract, bilateral: Secondary | ICD-10-CM | POA: Diagnosis not present

## 2018-02-06 NOTE — Telephone Encounter (Signed)
Patient notified and verbalized understanding. 

## 2018-02-06 NOTE — Telephone Encounter (Signed)
Please let him know that his kidney function is better, but not quite there. I'd like him to keep drinking water and we'll recheck in 2 weeks. Order in.

## 2018-02-19 IMAGING — CR DG LUMBAR SPINE 2-3V
3 series · 3 of 3 positions shown · non-contrast
Comparison: 05/04/2016 chest CT appear 01/11/2016 abdominal and
pelvic CT

CLINICAL DATA: Acute low back pain following injury 4 days ago.
Initial encounter.

EXAM:
LUMBAR SPINE - 2-3 VIEW

[l-spine ap]
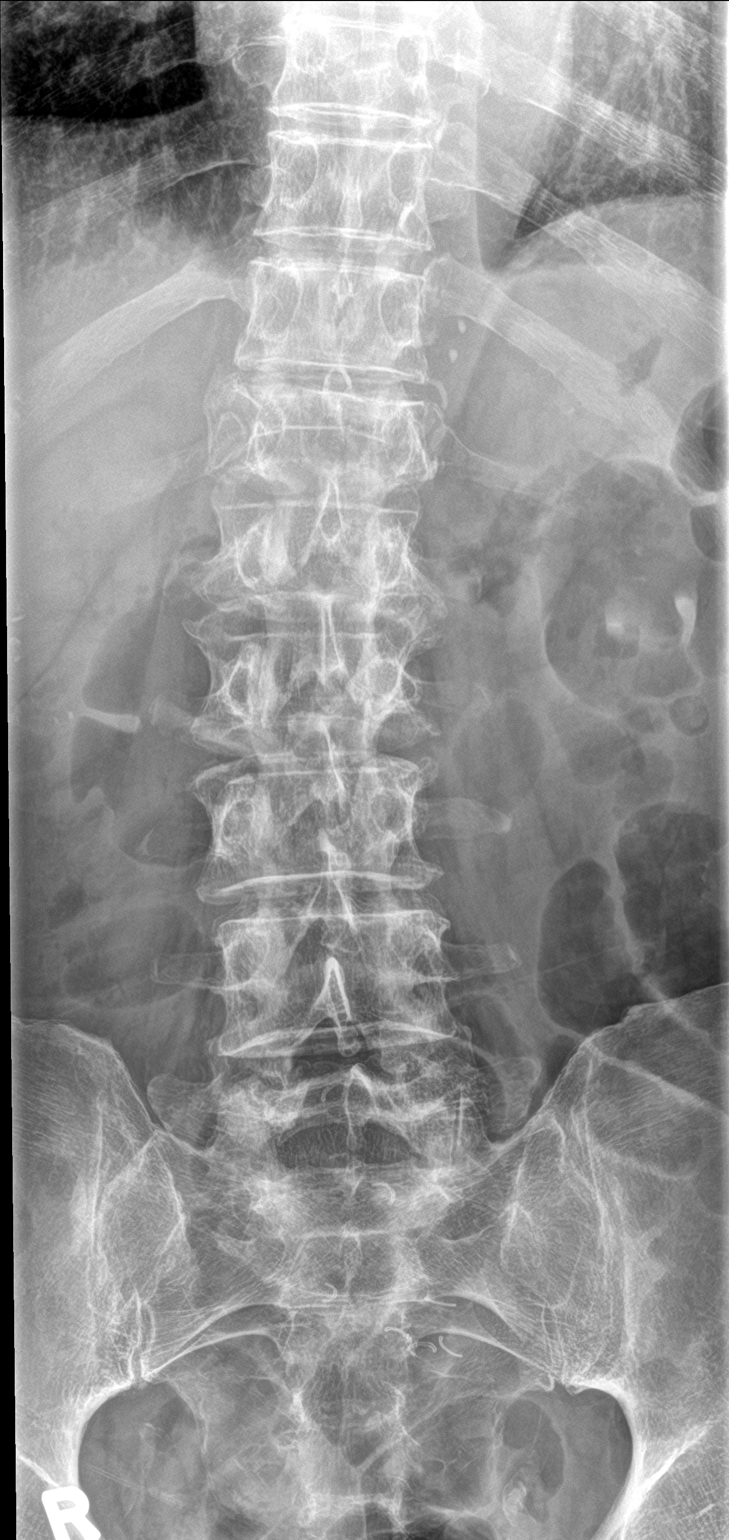

[l-spine lat]
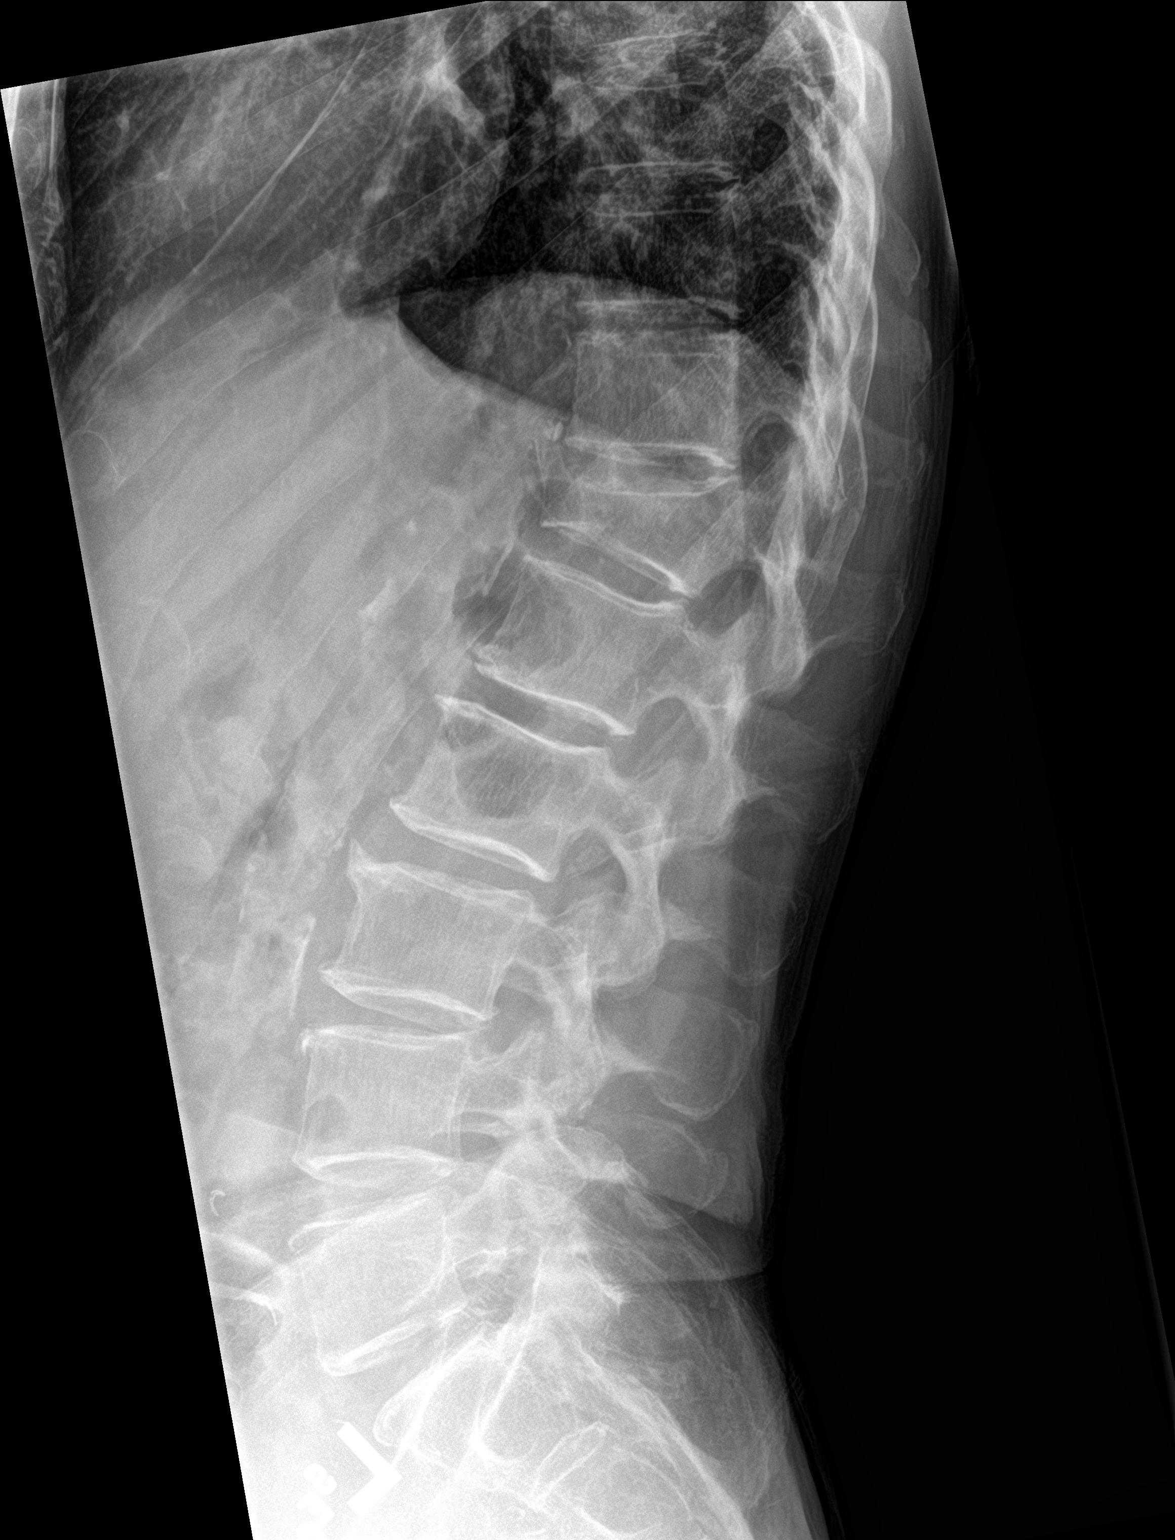

[l-spine spot]
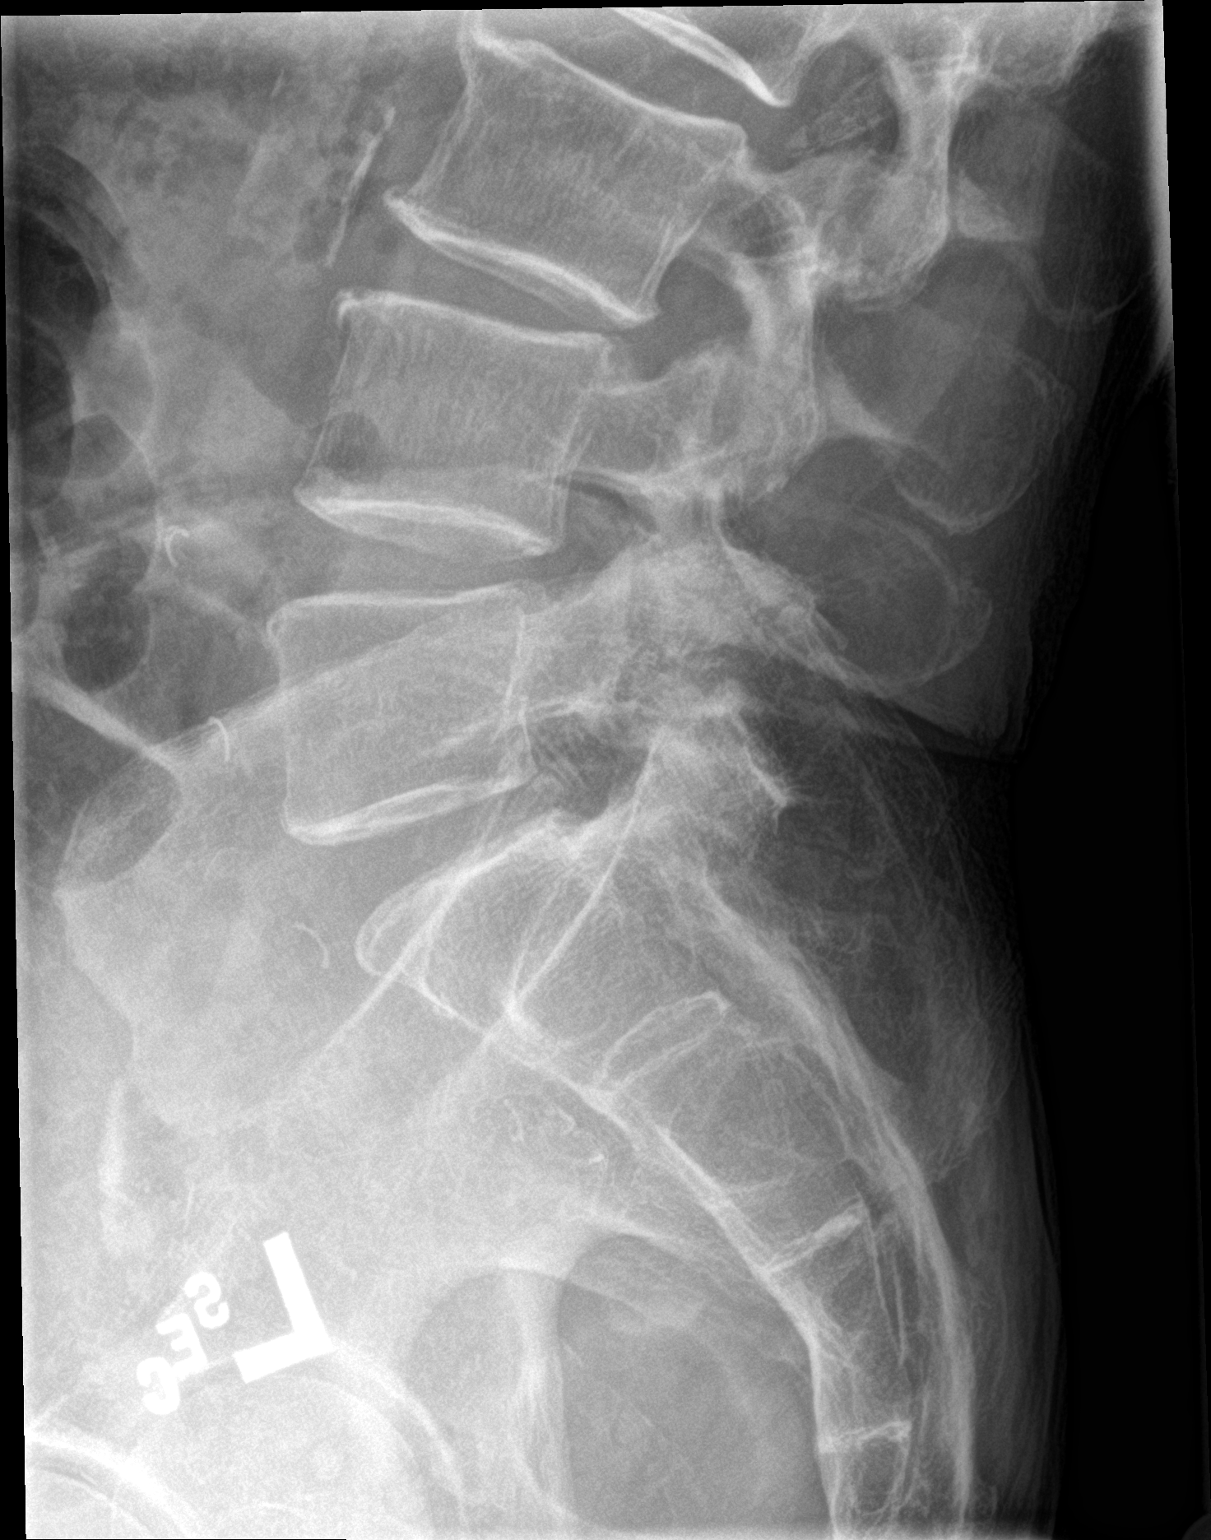

[3 of 3 positions shown; findings below may reference images not displayed]

FINDINGS: A 60% anterior wedge compression fracture of T12 is noted, new since
05/04/2016.

No other fracture or subluxation identified.

No definite bony retropulsion is noted.

Mild multilevel degenerative disc disease and spondylosis noted.
IMPRESSION: 60% anterior wedge compression fracture of T12. No definite bony
retropulsion.
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
## 2018-03-30 DIAGNOSIS — I10 Essential (primary) hypertension: Secondary | ICD-10-CM | POA: Diagnosis not present

## 2018-03-30 DIAGNOSIS — I4891 Unspecified atrial fibrillation: Secondary | ICD-10-CM | POA: Diagnosis not present

## 2018-03-30 DIAGNOSIS — R0602 Shortness of breath: Secondary | ICD-10-CM | POA: Diagnosis not present

## 2018-03-30 DIAGNOSIS — I2510 Atherosclerotic heart disease of native coronary artery without angina pectoris: Secondary | ICD-10-CM | POA: Diagnosis not present

## 2018-04-01 ENCOUNTER — Other Ambulatory Visit: Payer: Self-pay | Admitting: Family Medicine

## 2018-04-01 NOTE — Telephone Encounter (Signed)
Requested medication (s) are due for refill today: yes  Requested medication (s) are on the active medication list: yes  Last refill:  10/07/17  Future visit scheduled: no  Notes to clinic:  Medication for sleep   Requested Prescriptions  Pending Prescriptions Disp Refills   mirtazapine (REMERON) 30 MG tablet [Pharmacy Med Name: MIRTAZAPINE  30MG   TAB] 90 tablet 0    Sig: TAKE 1 TABLET BY MOUTH AT  BEDTIME     Psychiatry: Antidepressants - mirtazapine Failed - 04/01/2018  5:19 AM      Failed - Triglycerides in normal range and within 360 days    No results found for: TRIG       Failed - Total Cholesterol in normal range and within 360 days    No results found for: CHOL, POCCHOL       Passed - AST in normal range and within 360 days    AST  Date Value Ref Range Status  01/12/2018 26 0 - 40 IU/L Final   SGOT(AST)  Date Value Ref Range Status  10/20/2013 27 15 - 37 Unit/L Final         Passed - ALT in normal range and within 360 days    ALT  Date Value Ref Range Status  01/12/2018 22 0 - 44 IU/L Final   SGPT (ALT)  Date Value Ref Range Status  10/20/2013 23 U/L Final    Comment:    14-63 NOTE: New Reference Range 10/12/13          Passed - WBC in normal range and within 360 days    WBC  Date Value Ref Range Status  01/12/2018 5.4 3.4 - 10.8 x10E3/uL Final  02/20/2017 9.2 3.8 - 10.6 K/uL Final         Passed - Valid encounter within last 6 months    Recent Outpatient Visits          2 months ago Frothy urine   [place], Megan P, DO   3 months ago Constipation, unspecified constipation type   [facility] [place], [place]   7 months ago Dizziness   [place], [place]   1 year ago Chest pain, unspecified type   [facility] [place], [place]   1 year ago Urinary frequency   [place], [place], [place]

## 2018-04-06 DIAGNOSIS — R079 Chest pain, unspecified: Secondary | ICD-10-CM | POA: Diagnosis not present

## 2018-04-06 DIAGNOSIS — I4891 Unspecified atrial fibrillation: Secondary | ICD-10-CM | POA: Diagnosis not present

## 2018-04-09 DIAGNOSIS — I4891 Unspecified atrial fibrillation: Secondary | ICD-10-CM | POA: Diagnosis not present

## 2018-04-09 DIAGNOSIS — R0602 Shortness of breath: Secondary | ICD-10-CM | POA: Diagnosis not present

## 2018-04-09 DIAGNOSIS — I351 Nonrheumatic aortic (valve) insufficiency: Secondary | ICD-10-CM | POA: Diagnosis not present

## 2018-07-09 DIAGNOSIS — I351 Nonrheumatic aortic (valve) insufficiency: Secondary | ICD-10-CM | POA: Diagnosis not present

## 2018-07-09 DIAGNOSIS — I340 Nonrheumatic mitral (valve) insufficiency: Secondary | ICD-10-CM | POA: Diagnosis not present

## 2018-07-09 DIAGNOSIS — E785 Hyperlipidemia, unspecified: Secondary | ICD-10-CM | POA: Diagnosis not present

## 2018-07-09 DIAGNOSIS — I10 Essential (primary) hypertension: Secondary | ICD-10-CM | POA: Diagnosis not present

## 2018-07-09 DIAGNOSIS — I4891 Unspecified atrial fibrillation: Secondary | ICD-10-CM | POA: Diagnosis not present

## 2018-08-09 ENCOUNTER — Other Ambulatory Visit: Payer: Self-pay | Admitting: Family Medicine

## 2018-08-09 NOTE — Telephone Encounter (Signed)
Requested Prescriptions  Pending Prescriptions Disp Refills  . mirtazapine (REMERON) 30 MG tablet [Pharmacy Med Name: MIRTAZAPINE  30MG   TAB] 90 tablet 0    Sig: TAKE 1 TABLET BY MOUTH AT  BEDTIME     Psychiatry: Antidepressants - mirtazapine Failed - 08/09/2018  6:58 AM      Failed - Triglycerides in normal range and within 360 days    No results found for: TRIG       Failed - Total Cholesterol in normal range and within 360 days    No results found for: CHOL, POCCHOL       Failed - Valid encounter within last 6 months    Recent Outpatient Visits          6 months ago Frothy urine   [place], Megan P, DO   7 months ago Constipation, unspecified constipation type   [place], [place]   11 months ago [place], [place], DO   1 year ago Chest pain, unspecified type   [facility] [place], [place]   1 year ago Urinary frequency   [place], [place], [place]             Passed - AST in normal range and within 360 days    AST  Date Value Ref Range Status  01/12/2018 26 0 - 40 IU/L Final   SGOT(AST)  Date Value Ref Range Status  10/20/2013 27 15 - 37 Unit/L Final         Passed - ALT in normal range and within 360 days    ALT  Date Value Ref Range Status  01/12/2018 22 0 - 44 IU/L Final   SGPT (ALT)  Date Value Ref Range Status  10/20/2013 23 U/L Final    Comment:    14-63 NOTE: New Reference Range 10/12/13          Passed - WBC in normal range and within 360 days    WBC  Date Value Ref Range Status  01/12/2018 5.4 3.4 - 10.8 x10E3/uL Final  02/20/2017 9.2 3.8 - 10.6 K/uL Final

## 2018-10-01 ENCOUNTER — Other Ambulatory Visit: Payer: Self-pay

## 2018-10-01 ENCOUNTER — Ambulatory Visit (INDEPENDENT_AMBULATORY_CARE_PROVIDER_SITE_OTHER): Payer: Medicare Other | Admitting: Family Medicine

## 2018-10-01 ENCOUNTER — Encounter: Payer: Self-pay | Admitting: Family Medicine

## 2018-10-01 VITALS — BP 107/71 | HR 59 | Temp 97.7°F | Ht 69.0 in | Wt 130.0 lb

## 2018-10-01 DIAGNOSIS — M1990 Unspecified osteoarthritis, unspecified site: Secondary | ICD-10-CM

## 2018-10-01 MED ORDER — DICLOFENAC SODIUM 1 % TD GEL: 2.0000 g | Freq: Four times a day (QID) | TRANSDERMAL | 2 refills | Status: DC

## 2018-10-01 NOTE — Progress Notes (Signed)
BP 107/71   Pulse (!) 59   Temp 97.7 F (36.5 C) (Oral)   Ht 5\' 9"  (1.753 m)   Wt 130 lb (59 kg)   SpO2 98%   BMI 19.20 kg/m    Subjective:    Patient ID: Scott Guzman, male    DOB: 17-Jun-1940, 78 y.o.   MRN: [id number]  HPI: Scott Guzman is a 78 y.o. male  Chief Complaint  Patient presents with  . Hand Pain    swollen right pinky finger.  x2 days   Patient presents today with swollen, mildly deformed right pinky finger that he started to notice about 2 days ago. Able to bend it for the most part still, but some stiffness in the joint. No known injury, no pain, redness, warmth. Not trying anything for sxs. Known hx of arthritis.   Relevant past medical, surgical, family and social history reviewed and updated as indicated. Interim medical history since our last visit reviewed. Allergies and medications reviewed and updated.  Review of Systems  Per HPI unless specifically indicated above     Objective:    BP 107/71   Pulse (!) 59   Temp 97.7 F (36.5 C) (Oral)   Ht 5\' 9"  (1.753 m)   Wt 130 lb (59 kg)   SpO2 98%   BMI 19.20 kg/m   Wt Readings from Last 3 Encounters:  10/01/18 130 lb (59 kg)  01/12/18 135 lb 4.8 oz (61.4 kg)  12/24/17 132 lb 4.8 oz (60 kg)    Physical Exam Vitals signs and nursing note reviewed.  Constitutional:      Appearance: Normal appearance.  HENT:     Head: Atraumatic.  Eyes:     Extraocular Movements: Extraocular movements intact.     Conjunctiva/sclera: Conjunctivae normal.  Neck:     Musculoskeletal: Normal range of motion and neck supple.  Cardiovascular:     Rate and Rhythm: Normal rate.  Pulmonary:     Effort: Pulmonary effort is normal.     Breath sounds: Normal breath sounds.  Musculoskeletal: Normal range of motion.        General: Deformity (right 5th digit PIP edematous, decreased ROM) present. No tenderness.  Skin:    General: Skin is warm and dry.     Findings: No erythema.  Neurological:     General: No focal  deficit present.     Mental Status: He is oriented to person, place, and time.  Psychiatric:        Mood and Affect: Mood normal.        Thought Content: Thought content normal.        Judgment: Judgment normal.    Results for orders placed or performed in visit on 96/75/91  Basic metabolic panel  Result Value Ref Range   Glucose 91 65 - 99 mg/dL   BUN 18 8 - 27 mg/dL   Creatinine, Ser 1.44 (H) 0.76 - 1.27 mg/dL   GFR calc non Af Amer 47 (L) >59 mL/min/1.73   GFR calc Af Amer 55 (L) >59 mL/min/1.73   BUN/Creatinine Ratio 13 10 - 24   Sodium 142 134 - 144 mmol/L   Potassium 4.5 3.5 - 5.2 mmol/L   Chloride 102 96 - 106 mmol/L   CO2 26 20 - 29 mmol/L   Calcium 9.0 8.6 - 10.2 mg/dL      Assessment & Plan:   Problem List Items Addressed This Visit    None    Visit  Diagnoses    Arthritis    -  Primary   Deformities appear arthritic. Diclofenac gel topically prn for stiffness, epsom salt soaks. F/u with his Rheumatologist if worsening mobility or pain       Follow up plan: Return if symptoms worsen or fail to improve.

## 2018-10-01 NOTE — Patient Instructions (Signed)
If your insurance does not cover the medication, you can give the pharmacy the good rx card for the cash pay discount to make it much cheaper

## 2018-10-20 DIAGNOSIS — H1045 Other chronic allergic conjunctivitis: Secondary | ICD-10-CM | POA: Diagnosis not present

## 2018-10-26 DIAGNOSIS — I237 Postinfarction angina: Secondary | ICD-10-CM | POA: Diagnosis not present

## 2018-10-26 DIAGNOSIS — I10 Essential (primary) hypertension: Secondary | ICD-10-CM | POA: Diagnosis not present

## 2018-10-26 DIAGNOSIS — I4891 Unspecified atrial fibrillation: Secondary | ICD-10-CM | POA: Diagnosis not present

## 2018-10-26 DIAGNOSIS — R0602 Shortness of breath: Secondary | ICD-10-CM | POA: Diagnosis not present

## 2018-10-28 DIAGNOSIS — I237 Postinfarction angina: Secondary | ICD-10-CM | POA: Diagnosis not present

## 2018-10-28 DIAGNOSIS — R0602 Shortness of breath: Secondary | ICD-10-CM | POA: Diagnosis not present

## 2018-10-28 DIAGNOSIS — I2510 Atherosclerotic heart disease of native coronary artery without angina pectoris: Secondary | ICD-10-CM | POA: Diagnosis not present

## 2018-10-30 DIAGNOSIS — I4891 Unspecified atrial fibrillation: Secondary | ICD-10-CM | POA: Diagnosis not present

## 2018-10-30 DIAGNOSIS — I10 Essential (primary) hypertension: Secondary | ICD-10-CM | POA: Diagnosis not present

## 2018-10-30 DIAGNOSIS — I2510 Atherosclerotic heart disease of native coronary artery without angina pectoris: Secondary | ICD-10-CM | POA: Diagnosis not present

## 2018-10-30 DIAGNOSIS — R0602 Shortness of breath: Secondary | ICD-10-CM | POA: Diagnosis not present

## 2018-11-06 DIAGNOSIS — I2510 Atherosclerotic heart disease of native coronary artery without angina pectoris: Secondary | ICD-10-CM | POA: Diagnosis not present

## 2018-11-06 DIAGNOSIS — R0602 Shortness of breath: Secondary | ICD-10-CM | POA: Diagnosis not present

## 2018-11-06 DIAGNOSIS — I10 Essential (primary) hypertension: Secondary | ICD-10-CM | POA: Diagnosis not present

## 2018-11-06 DIAGNOSIS — I4891 Unspecified atrial fibrillation: Secondary | ICD-10-CM | POA: Diagnosis not present

## 2018-11-11 DIAGNOSIS — H6982 Other specified disorders of Eustachian tube, left ear: Secondary | ICD-10-CM | POA: Diagnosis not present

## 2018-11-11 DIAGNOSIS — H6123 Impacted cerumen, bilateral: Secondary | ICD-10-CM | POA: Diagnosis not present

## 2018-12-01 ENCOUNTER — Other Ambulatory Visit
Admission: RE | Admit: 2018-12-01 | Discharge: 2018-12-01 | Disposition: A | Discharge status: Home / Self Care | Payer: Medicare Other | Source: Ambulatory Visit | Attending: Cardiovascular Disease | Admitting: Cardiovascular Disease

## 2018-12-01 ENCOUNTER — Ambulatory Visit: Payer: Self-pay | Admitting: Cardiovascular Disease

## 2018-12-01 ENCOUNTER — Other Ambulatory Visit: Payer: Self-pay

## 2018-12-01 DIAGNOSIS — I2510 Atherosclerotic heart disease of native coronary artery without angina pectoris: Secondary | ICD-10-CM | POA: Diagnosis not present

## 2018-12-01 DIAGNOSIS — R55 Syncope and collapse: Secondary | ICD-10-CM | POA: Diagnosis not present

## 2018-12-01 DIAGNOSIS — I200 Unstable angina: Secondary | ICD-10-CM | POA: Insufficient documentation

## 2018-12-01 DIAGNOSIS — Z20828 Contact with and (suspected) exposure to other viral communicable diseases: Secondary | ICD-10-CM | POA: Diagnosis present

## 2018-12-01 DIAGNOSIS — I209 Angina pectoris, unspecified: Secondary | ICD-10-CM | POA: Diagnosis not present

## 2018-12-01 DIAGNOSIS — Z01812 Encounter for preprocedural laboratory examination: Secondary | ICD-10-CM | POA: Insufficient documentation

## 2018-12-01 DIAGNOSIS — R0602 Shortness of breath: Secondary | ICD-10-CM | POA: Diagnosis not present

## 2018-12-01 DIAGNOSIS — S0081XA Abrasion of other part of head, initial encounter: Secondary | ICD-10-CM | POA: Diagnosis not present

## 2018-12-01 DIAGNOSIS — I10 Essential (primary) hypertension: Secondary | ICD-10-CM | POA: Diagnosis not present

## 2018-12-01 MED ORDER — SODIUM CHLORIDE 0.9% FLUSH
3.0000 mL | Freq: Two times a day (BID) | INTRAVENOUS | Status: DC
  Filled 2018-12-01: qty 3

## 2018-12-02 ENCOUNTER — Other Ambulatory Visit: Payer: Self-pay | Admitting: Cardiovascular Disease

## 2018-12-02 LAB — SARS CORONAVIRUS 2 (TAT 6-24 HRS): SARS Coronavirus 2: NEGATIVE

## 2018-12-03 ENCOUNTER — Other Ambulatory Visit: Payer: Self-pay | Admitting: Family Medicine

## 2018-12-03 ENCOUNTER — Encounter
Admission: RE | Disposition: A | Discharge status: Home / Self Care | Payer: Self-pay | Source: Home / Self Care | Attending: Cardiovascular Disease

## 2018-12-03 ENCOUNTER — Other Ambulatory Visit: Payer: Self-pay

## 2018-12-03 ENCOUNTER — Ambulatory Visit
Admission: RE | Admit: 2018-12-03 | Discharge: 2018-12-03 | Disposition: A | Discharge status: Home / Self Care | Payer: Medicare Other | Attending: Cardiovascular Disease | Admitting: Cardiovascular Disease

## 2018-12-03 DIAGNOSIS — I4891 Unspecified atrial fibrillation: Secondary | ICD-10-CM | POA: Insufficient documentation

## 2018-12-03 DIAGNOSIS — I2584 Coronary atherosclerosis due to calcified coronary lesion: Secondary | ICD-10-CM | POA: Diagnosis not present

## 2018-12-03 DIAGNOSIS — R269 Unspecified abnormalities of gait and mobility: Secondary | ICD-10-CM | POA: Diagnosis not present

## 2018-12-03 DIAGNOSIS — Z79899 Other long term (current) drug therapy: Secondary | ICD-10-CM | POA: Diagnosis not present

## 2018-12-03 DIAGNOSIS — Z7901 Long term (current) use of anticoagulants: Secondary | ICD-10-CM | POA: Insufficient documentation

## 2018-12-03 DIAGNOSIS — Z7902 Long term (current) use of antithrombotics/antiplatelets: Secondary | ICD-10-CM | POA: Diagnosis not present

## 2018-12-03 DIAGNOSIS — I200 Unstable angina: Secondary | ICD-10-CM | POA: Insufficient documentation

## 2018-12-03 DIAGNOSIS — I25110 Atherosclerotic heart disease of native coronary artery with unstable angina pectoris: Secondary | ICD-10-CM | POA: Insufficient documentation

## 2018-12-03 DIAGNOSIS — R42 Dizziness and giddiness: Secondary | ICD-10-CM | POA: Insufficient documentation

## 2018-12-03 DIAGNOSIS — Z8249 Family history of ischemic heart disease and other diseases of the circulatory system: Secondary | ICD-10-CM | POA: Insufficient documentation

## 2018-12-03 DIAGNOSIS — I10 Essential (primary) hypertension: Secondary | ICD-10-CM | POA: Diagnosis not present

## 2018-12-03 DIAGNOSIS — R0602 Shortness of breath: Secondary | ICD-10-CM | POA: Diagnosis not present

## 2018-12-03 DIAGNOSIS — Z87891 Personal history of nicotine dependence: Secondary | ICD-10-CM | POA: Diagnosis not present

## 2018-12-03 HISTORY — PX: LEFT HEART CATH AND CORONARY ANGIOGRAPHY: CATH118249

## 2018-12-03 SURGERY — LEFT HEART CATH AND CORONARY ANGIOGRAPHY
Anesthesia: Moderate Sedation | Laterality: Right

## 2018-12-03 MED ORDER — SODIUM BICARBONATE 8.4 % IV SOLN
INTRAVENOUS | Status: DC
  Administered 2018-12-03: 11:00:00 via INTRAVENOUS
  Filled 2018-12-03: qty 1000

## 2018-12-03 MED ORDER — FENTANYL CITRATE (PF) 100 MCG/2ML IJ SOLN
INTRAMUSCULAR | Status: AC
  Filled 2018-12-03: qty 2

## 2018-12-03 MED ORDER — VERAPAMIL HCL 2.5 MG/ML IV SOLN
INTRAVENOUS | Status: AC
  Filled 2018-12-03: qty 2

## 2018-12-03 MED ORDER — SODIUM BICARBONATE BOLUS VIA INFUSION
INTRAVENOUS | Status: DC
  Filled 2018-12-03: qty 1

## 2018-12-03 MED ORDER — HYDRALAZINE HCL 20 MG/ML IJ SOLN: 10.0000 mg | INTRAMUSCULAR | Status: DC | PRN

## 2018-12-03 MED ORDER — SODIUM CHLORIDE 0.9% FLUSH: 3.0000 mL | Freq: Two times a day (BID) | INTRAVENOUS | Status: DC

## 2018-12-03 MED ORDER — SODIUM CHLORIDE 0.9 % IV SOLN: 250.0000 mL | INTRAVENOUS | Status: DC | PRN

## 2018-12-03 MED ORDER — FENTANYL CITRATE (PF) 100 MCG/2ML IJ SOLN
INTRAMUSCULAR | Status: DC | PRN
  Administered 2018-12-03: 25 ug via INTRAVENOUS

## 2018-12-03 MED ORDER — SODIUM CHLORIDE 0.9% FLUSH: 3.0000 mL | INTRAVENOUS | Status: DC | PRN

## 2018-12-03 MED ORDER — HEPARIN (PORCINE) IN NACL 1000-0.9 UT/500ML-% IV SOLN
INTRAVENOUS | Status: DC | PRN
  Administered 2018-12-03: 500 mL

## 2018-12-03 MED ORDER — ONDANSETRON HCL 4 MG/2ML IJ SOLN: 4.0000 mg | Freq: Four times a day (QID) | INTRAMUSCULAR | Status: DC | PRN

## 2018-12-03 MED ORDER — HEPARIN SODIUM (PORCINE) 1000 UNIT/ML IJ SOLN
INTRAMUSCULAR | Status: AC
  Filled 2018-12-03: qty 1

## 2018-12-03 MED ORDER — VERAPAMIL HCL 2.5 MG/ML IV SOLN
INTRAVENOUS | Status: DC | PRN
  Administered 2018-12-03: 2.5 mg via INTRA_ARTERIAL

## 2018-12-03 MED ORDER — HEPARIN SODIUM (PORCINE) 1000 UNIT/ML IJ SOLN
INTRAMUSCULAR | Status: DC | PRN
  Administered 2018-12-03: 3000 [IU] via INTRAVENOUS

## 2018-12-03 MED ORDER — CLOPIDOGREL BISULFATE 75 MG PO TABS
75.0000 mg | ORAL_TABLET | ORAL | Status: AC
  Administered 2018-12-03: 75 mg via ORAL

## 2018-12-03 MED ORDER — CLOPIDOGREL BISULFATE 75 MG PO TABS
ORAL_TABLET | ORAL | Status: AC
  Filled 2018-12-03: qty 1

## 2018-12-03 MED ORDER — IOHEXOL 300 MG/ML  SOLN
INTRAMUSCULAR | Status: DC | PRN
  Administered 2018-12-03: 25 mL

## 2018-12-03 MED ORDER — MIDAZOLAM HCL 2 MG/2ML IJ SOLN
INTRAMUSCULAR | Status: DC | PRN
  Administered 2018-12-03: 1 mg via INTRAVENOUS

## 2018-12-03 MED ORDER — SODIUM BICARBONATE 8.4 % IV SOLN
INTRAVENOUS | Status: DC
  Filled 2018-12-03: qty 850

## 2018-12-03 MED ORDER — LABETALOL HCL 5 MG/ML IV SOLN: 10.0000 mg | INTRAVENOUS | Status: DC | PRN

## 2018-12-03 MED ORDER — SODIUM CHLORIDE 0.9 % WEIGHT BASED INFUSION: 1.0000 mL/kg/h | INTRAVENOUS | Status: DC

## 2018-12-03 MED ORDER — HEPARIN (PORCINE) IN NACL 1000-0.9 UT/500ML-% IV SOLN
INTRAVENOUS | Status: AC
  Filled 2018-12-03: qty 1000

## 2018-12-03 MED ORDER — ACETAMINOPHEN 325 MG PO TABS: 650.0000 mg | ORAL_TABLET | ORAL | Status: DC | PRN

## 2018-12-03 MED ORDER — SODIUM CHLORIDE 0.9 % WEIGHT BASED INFUSION
3.0000 mL/kg/h | INTRAVENOUS | Status: DC
  Administered 2018-12-03: 3 mL/kg/h via INTRAVENOUS

## 2018-12-03 MED ORDER — MIDAZOLAM HCL 2 MG/2ML IJ SOLN
INTRAMUSCULAR | Status: AC
  Filled 2018-12-03: qty 2

## 2018-12-03 SURGICAL SUPPLY — 7 items
CATH 5F 110X4 TIG (CATHETERS) ×2 IMPLANT
CATH INFINITI JR4 5F (CATHETERS) ×2 IMPLANT
DEVICE RAD TR BAND REGULAR (VASCULAR PRODUCTS) ×2 IMPLANT
GLIDESHEATH SLEND SS 6F .021 (SHEATH) ×2 IMPLANT
KIT MANI 3VAL PERCEP (MISCELLANEOUS) ×2 IMPLANT
PACK CARDIAC CATH (CUSTOM PROCEDURE TRAY) ×2 IMPLANT
WIRE ROSEN-J .035X260CM (WIRE) ×2 IMPLANT

## 2018-12-03 NOTE — Consult Note (Signed)
[facility] for Sodium Bicarbonate Rate for Prevention of Contrast-Induced Nephropathy (CIN)  Patient Measurements: Height: 5\' 9"  (175.3 cm) Weight: 130 lb (59 kg) IBW/kg (Calculated) : 70.7  Vital Signs: Temp: 97.6 F (36.4 C) (09/10 1054) Temp Source: Oral (09/10 1054) BP: 138/75 (09/10 1054) Pulse Rate: 57 (09/10 1054) Intake/Output from previous day: No intake/output data recorded. Intake/Output from this shift: No intake/output data recorded.  Labs: No results for input(s): WBC, HGB, HCT, PLT, APTT, CREATININE, LABCREA, CREATININE, CREAT24HRUR, MG, PHOS, ALBUMIN, PROT, ALBUMIN, AST, ALT, ALKPHOS, BILITOT, BILIDIR, IBILI in the last 72 hours. CrCl cannot be calculated (Patient's most recent lab result is older than the maximum 21 days allowed.).   Microbiology: Recent Results (from the past 720 hour(s))  SARS CORONAVIRUS 2 (TAT 6-24 HRS) Nasopharyngeal Nasopharyngeal Swab     Status: None   Collection Time: 12/01/18  2:38 PM   Specimen: Nasopharyngeal Swab  Result Value Ref Range Status   SARS Coronavirus 2 NEGATIVE NEGATIVE Final    Comment: (NOTE) SARS-CoV-2 target nucleic acids are NOT DETECTED. The SARS-CoV-2 RNA is generally detectable in upper and lower respiratory specimens during the acute phase of infection. Negative results do not preclude SARS-CoV-2 infection, do not rule out co-infections with other pathogens, and should not be used as the sole basis for treatment or other patient management decisions. Negative results must be combined with clinical observations, patient history, and epidemiological information. The expected result is Negative. Fact Sheet for Patients: SugarRoll.be Fact Sheet for Healthcare Providers: https://www.woods-mathews.com/ This test is not yet approved or cleared by the [place] FDA and  has been authorized for detection and/or diagnosis of  SARS-CoV-2 by FDA under an Emergency Use Authorization (EUA). This EUA will remain  in effect (meaning this test can be used) for the duration of the COVID-19 declaration under Section 56 4(b)(1) of the Act, 21 U.S.C. section 360bbb-3(b)(1), unless the authorization is terminated or revoked sooner. Performed at [facility], [place] [address], [place], [place] [address]     Assessment: Pharmacy consulted for Sodium Bicarbonate Rate  for Prevention of Contrast-Induced Nephropathy (CIN).  Weight: 59 kg  Plan:  Recommended dose: Sodium Bicarb 150 mEq in D5 1080mL 19mL/Kg/Hr  X 1 hour followed by 49mL/Kg/Hr for 6 hours.   Current order for Sodium Bicarb in D5W  172ml x 1 hour followed by 43mL/Hr is correct based on current weight of 69 Kg.   No adjustments need to be made by pharmacy.  Pernell Dupre, PharmD, BCPS Clinical Pharmacist 12/03/2018 11:25 AM

## 2018-12-03 NOTE — Telephone Encounter (Signed)
Forwarding medication refill request to PCP for review. 

## 2018-12-04 ENCOUNTER — Encounter: Payer: Self-pay | Admitting: Cardiovascular Disease

## 2018-12-04 DIAGNOSIS — I10 Essential (primary) hypertension: Secondary | ICD-10-CM | POA: Diagnosis not present

## 2018-12-04 DIAGNOSIS — I2510 Atherosclerotic heart disease of native coronary artery without angina pectoris: Secondary | ICD-10-CM | POA: Insufficient documentation

## 2018-12-04 DIAGNOSIS — R55 Syncope and collapse: Secondary | ICD-10-CM | POA: Diagnosis not present

## 2018-12-04 DIAGNOSIS — I4891 Unspecified atrial fibrillation: Secondary | ICD-10-CM | POA: Diagnosis not present

## 2018-12-17 ENCOUNTER — Ambulatory Visit (INDEPENDENT_AMBULATORY_CARE_PROVIDER_SITE_OTHER): Payer: Medicare Other | Admitting: Unknown Physician Specialty

## 2018-12-17 ENCOUNTER — Encounter: Payer: Self-pay | Admitting: Unknown Physician Specialty

## 2018-12-17 ENCOUNTER — Other Ambulatory Visit: Payer: Self-pay

## 2018-12-17 VITALS — Temp 97.7°F | Wt 130.0 lb

## 2018-12-17 DIAGNOSIS — J069 Acute upper respiratory infection, unspecified: Secondary | ICD-10-CM

## 2018-12-17 NOTE — Progress Notes (Signed)
Temp 97.7 F (36.5 C) (Oral)   Wt 130 lb (59 kg)   BMI 19.20 kg/m    Subjective:    Patient ID: Scott Guzman, male    DOB: 04-Sep-1940, 78 y.o.   MRN: [id number]  HPI: Scott Guzman is a 78 y.o. male  Chief Complaint  Patient presents with  . Sore Throat    x 2 days now. heve tried OTC  flu/cold medication   . Nasal Congestion    . This visit was completed via telephone due to the restrictions of the COVID-19 pandemic. All issues as above were discussed and addressed but no physical exam was performed. If it was felt that the patient should be evaluated in the office, they were directed there. The patient verbally consented to this visit. Patient was unable to complete an audio/visual visit due to {Blank single:19197::"Technical difficulties,Lack of internet . Location of the patient: home . Location of the provider: work . Those involved with this call:  . Provider: Kathrine Haddock, DNP . CMA: Yvonna Alanis, CMA  . Front Desk/Registration: Jill Side  . Time spent on call: 10 minutes on the phone discussing health concerns. 10 minutes total spent in review of patient's record and preparation of their chart.   Sore Throat  This is a new problem. Episode onset: 2 days. The problem has been unchanged. There has been no fever. Associated symptoms include congestion, coughing and a hoarse voice. Pertinent negatives include no abdominal pain, diarrhea, ear pain, headaches, shortness of breath, trouble swallowing or vomiting. Treatments tried: OTC cold treatments. The treatment provided no relief.     Relevant past medical, surgical, family and social history reviewed and updated as indicated. Interim medical history since our last visit reviewed. Allergies and medications reviewed and updated.  Review of Systems  HENT: Positive for congestion and hoarse voice. Negative for ear pain and trouble swallowing.   Respiratory: Positive for cough. Negative for shortness of breath.    Gastrointestinal: Negative for abdominal pain, diarrhea and vomiting.  Neurological: Negative for headaches.   Have Covid test 2 weeks ago in preparation for a heart cath  Per HPI unless specifically indicated above     Objective:    Temp 97.7 F (36.5 C) (Oral)   Wt 130 lb (59 kg)   BMI 19.20 kg/m   Wt Readings from Last 3 Encounters:  12/17/18 130 lb (59 kg)  12/03/18 130 lb (59 kg)  10/01/18 130 lb (59 kg)    Physical Exam Neurological:     Mental Status: He is alert and oriented to person, place, and time.  Psychiatric:        Mood and Affect: Mood normal.     Results for orders placed or performed during the hospital encounter of 12/01/18  SARS CORONAVIRUS 2 (TAT 6-24 HRS) Nasopharyngeal Nasopharyngeal Swab   Specimen: Nasopharyngeal Swab  Result Value Ref Range   SARS Coronavirus 2 NEGATIVE NEGATIVE      Assessment & Plan:   Problem List Items Addressed This Visit    None    Visit Diagnoses    Viral upper respiratory tract infection    -  Primary   discussed symptoms consistent with a viral URI and recommended supportive care of rest and fluids.  recommended and ordered Covid testing   Relevant Orders   Novel Coronavirus, NAA ([facility])      Recommended self isolation until test results.  Will consider antibiotics if symptoms worse after 5 days or no  improvement after 7-10 days.    Follow up plan: Return if symptoms worsen or fail to improve.

## 2018-12-17 NOTE — Patient Instructions (Signed)
Viral Respiratory Infection A respiratory infection is an illness that affects part of the respiratory system, such as the lungs, nose, or throat. A respiratory infection that is caused by a virus is called a viral respiratory infection. Common types of viral respiratory infections include:  A cold.  The flu (influenza).  A respiratory syncytial virus (RSV) infection. What are the causes? This condition is caused by a virus. What are the signs or symptoms? Symptoms of this condition include:  A stuffy or runny nose.  Yellow or green nasal discharge.  A cough.  Sneezing.  Fatigue.  Achy muscles.  A sore throat.  Sweating or chills.  A fever.  A headache. How is this diagnosed? This condition may be diagnosed based on:  Your symptoms.  A physical exam.  Testing of nasal swabs. How is this treated? This condition may be treated with medicines, such as:  Antiviral medicine. This may shorten the length of time a person has symptoms.  Expectorants. These make it easier to cough up mucus.  Decongestant nasal sprays.  Acetaminophen or NSAIDs to relieve fever and pain. Antibiotic medicines are not prescribed for viral infections. This is because antibiotics are designed to kill bacteria. They are not effective against viruses. Follow these instructions at home:  Managing pain and congestion  Take over-the-counter and prescription medicines only as told by your health care provider.  If you have a sore throat, gargle with a salt-water mixture 3-4 times a day or as needed. To make a salt-water mixture, completely dissolve -1 tsp of salt in 1 cup of warm water.  Use nose drops made from salt water to ease congestion and soften raw skin around your nose.  Drink enough fluid to keep your urine pale yellow. This helps prevent dehydration and helps loosen up mucus. General instructions  Rest as much as possible.  Do not drink alcohol.  Do not use any products  that contain nicotine or tobacco, such as cigarettes and e-cigarettes. If you need help quitting, ask your health care provider.  Keep all follow-up visits as told by your health care provider. This is important. How is this prevented?   Get an annual flu shot. You may get the flu shot in late summer, fall, or winter. Ask your health care provider when you should get your flu shot.  Avoid exposing others to your respiratory infection. ? Stay home from work or school as told by your health care provider. ? Wash your hands with soap and water often, especially after you cough or sneeze. If soap and water are not available, use alcohol-based hand sanitizer.  Avoid contact with people who are sick during cold and flu season. This is generally fall and winter. Contact a health care provider if:  Your symptoms last for 10 days or longer.  Your symptoms get worse over time.  You have a fever.  You have severe sinus pain in your face or forehead.  The glands in your jaw or neck become very swollen. Get help right away if you:  Feel pain or pressure in your chest.  Have shortness of breath.  Faint or feel like you will faint.  Have severe and persistent vomiting.  Feel confused or disoriented. Summary  A respiratory infection is an illness that affects part of the respiratory system, such as the lungs, nose, or throat. A respiratory infection that is caused by a virus is called a viral respiratory infection.  Common types of viral respiratory infections are a   cold, influenza, and respiratory syncytial virus (RSV) infection.  Symptoms of this condition include a stuffy or runny nose, cough, sneezing, fatigue, achy muscles, sore throat, and fevers or chills.  Antibiotic medicines are not prescribed for viral infections. This is because antibiotics are designed to kill bacteria. They are not effective against viruses. This information is not intended to replace advice given to you by  your health care provider. Make sure you discuss any questions you have with your health care provider. Document Released: 12/19/2004 Document Revised: 03/19/2018 Document Reviewed: 04/21/2017 Elsevier Patient Education  2020 [employer].  Viral Respiratory Infection A respiratory infection is an illness that affects part of the respiratory system, such as the lungs, nose, or throat. A respiratory infection that is caused by a virus is called a viral respiratory infection. Common types of viral respiratory infections include:  A cold.  The flu (influenza).  A respiratory syncytial virus (RSV) infection. What are the causes? This condition is caused by a virus. What are the signs or symptoms? Symptoms of this condition include:  A stuffy or runny nose.  Yellow or green nasal discharge.  A cough.  Sneezing.  Fatigue.  Achy muscles.  A sore throat.  Sweating or chills.  A fever.  A headache. How is this diagnosed? This condition may be diagnosed based on:  Your symptoms.  A physical exam.  Testing of nasal swabs. How is this treated? This condition may be treated with medicines, such as:  Antiviral medicine. This may shorten the length of time a person has symptoms.  Expectorants. These make it easier to cough up mucus.  Decongestant nasal sprays.  Acetaminophen or NSAIDs to relieve fever and pain. Antibiotic medicines are not prescribed for viral infections. This is because antibiotics are designed to kill bacteria. They are not effective against viruses. Follow these instructions at home:  Managing pain and congestion  Take over-the-counter and prescription medicines only as told by your health care provider.  If you have a sore throat, gargle with a salt-water mixture 3-4 times a day or as needed. To make a salt-water mixture, completely dissolve -1 tsp of salt in 1 cup of warm water.  Use nose drops made from salt water to ease congestion and soften  raw skin around your nose.  Drink enough fluid to keep your urine pale yellow. This helps prevent dehydration and helps loosen up mucus. General instructions  Rest as much as possible.  Do not drink alcohol.  Do not use any products that contain nicotine or tobacco, such as cigarettes and e-cigarettes. If you need help quitting, ask your health care provider.  Keep all follow-up visits as told by your health care provider. This is important. How is this prevented?   Get an annual flu shot. You may get the flu shot in late summer, fall, or winter. Ask your health care provider when you should get your flu shot.  Avoid exposing others to your respiratory infection. ? Stay home from work or school as told by your health care provider. ? Wash your hands with soap and water often, especially after you cough or sneeze. If soap and water are not available, use alcohol-based hand sanitizer.  Avoid contact with people who are sick during cold and flu season. This is generally fall and winter. Contact a health care provider if:  Your symptoms last for 10 days or longer.  Your symptoms get worse over time.  You have a fever.  You have severe sinus  pain in your face or forehead.  The glands in your jaw or neck become very swollen. Get help right away if you:  Feel pain or pressure in your chest.  Have shortness of breath.  Faint or feel like you will faint.  Have severe and persistent vomiting.  Feel confused or disoriented. Summary  A respiratory infection is an illness that affects part of the respiratory system, such as the lungs, nose, or throat. A respiratory infection that is caused by a virus is called a viral respiratory infection.  Common types of viral respiratory infections are a cold, influenza, and respiratory syncytial virus (RSV) infection.  Symptoms of this condition include a stuffy or runny nose, cough, sneezing, fatigue, achy muscles, sore throat, and fevers  or chills.  Antibiotic medicines are not prescribed for viral infections. This is because antibiotics are designed to kill bacteria. They are not effective against viruses. This information is not intended to replace advice given to you by your health care provider. Make sure you discuss any questions you have with your health care provider. Document Released: 12/19/2004 Document Revised: 03/19/2018 Document Reviewed: 04/21/2017 Elsevier Patient Education  2020 [employer].

## 2018-12-18 ENCOUNTER — Other Ambulatory Visit: Payer: Self-pay

## 2018-12-18 DIAGNOSIS — Z20822 Contact with and (suspected) exposure to covid-19: Secondary | ICD-10-CM

## 2018-12-18 DIAGNOSIS — R6889 Other general symptoms and signs: Secondary | ICD-10-CM | POA: Diagnosis not present

## 2018-12-19 LAB — NOVEL CORONAVIRUS, NAA: SARS-CoV-2, NAA: NOT DETECTED

## 2018-12-29 ENCOUNTER — Other Ambulatory Visit: Payer: Medicare Other

## 2018-12-30 ENCOUNTER — Ambulatory Visit (INDEPENDENT_AMBULATORY_CARE_PROVIDER_SITE_OTHER): Payer: Medicare Other

## 2018-12-30 ENCOUNTER — Other Ambulatory Visit: Payer: Self-pay

## 2018-12-30 ENCOUNTER — Other Ambulatory Visit: Payer: Medicare Other

## 2018-12-30 DIAGNOSIS — N289 Disorder of kidney and ureter, unspecified: Secondary | ICD-10-CM

## 2018-12-30 DIAGNOSIS — Z23 Encounter for immunization: Secondary | ICD-10-CM | POA: Diagnosis not present

## 2018-12-31 LAB — BASIC METABOLIC PANEL
BUN/Creatinine Ratio: 17 (ref 10–24)
BUN: 26 mg/dL (ref 8–27)
CO2: 25 mmol/L (ref 20–29)
Calcium: 9.4 mg/dL (ref 8.6–10.2)
Chloride: 103 mmol/L (ref 96–106)
Creatinine, Ser: 1.51 mg/dL — ABNORMAL HIGH (ref 0.76–1.27)
GFR calc Af Amer: 51 mL/min/{1.73_m2} — ABNORMAL LOW (ref >59)
GFR calc non Af Amer: 44 mL/min/{1.73_m2} — ABNORMAL LOW (ref >59)
Glucose: 86 mg/dL (ref 65–99)
Potassium: 4.9 mmol/L (ref 3.5–5.2)
Sodium: 141 mmol/L (ref 134–144)

## 2019-01-10 ENCOUNTER — Encounter: Payer: Self-pay | Admitting: Unknown Physician Specialty

## 2019-01-14 ENCOUNTER — Other Ambulatory Visit: Payer: Self-pay | Admitting: Unknown Physician Specialty

## 2019-01-14 MED ORDER — ALBUTEROL SULFATE HFA 108 (90 BASE) MCG/ACT IN AERS: 2.0000 | INHALATION_SPRAY | Freq: Four times a day (QID) | RESPIRATORY_TRACT | 1 refills | Status: DC | PRN

## 2019-01-18 ENCOUNTER — Encounter: Payer: Self-pay | Admitting: Unknown Physician Specialty

## 2019-01-19 ENCOUNTER — Ambulatory Visit
Admission: RE | Admit: 2019-01-19 | Discharge: 2019-01-19 | Disposition: A | Discharge status: Home / Self Care | Payer: Medicare Other | Source: Ambulatory Visit | Attending: Nurse Practitioner | Admitting: Nurse Practitioner

## 2019-01-19 ENCOUNTER — Other Ambulatory Visit: Payer: Self-pay

## 2019-01-19 ENCOUNTER — Ambulatory Visit
Admission: RE | Admit: 2019-01-19 | Discharge: 2019-01-19 | Disposition: A | Discharge status: Home / Self Care | Payer: Medicare Other | Attending: Nurse Practitioner | Admitting: Nurse Practitioner

## 2019-01-19 ENCOUNTER — Other Ambulatory Visit: Payer: Self-pay | Admitting: Nurse Practitioner

## 2019-01-19 DIAGNOSIS — R05 Cough: Secondary | ICD-10-CM | POA: Insufficient documentation

## 2019-01-19 DIAGNOSIS — R059 Cough, unspecified: Secondary | ICD-10-CM

## 2019-01-19 NOTE — Progress Notes (Signed)
Chest imaging for ongoing cough

## 2019-02-02 DIAGNOSIS — R931 Abnormal findings on diagnostic imaging of heart and coronary circulation: Secondary | ICD-10-CM | POA: Diagnosis not present

## 2019-02-02 DIAGNOSIS — I4891 Unspecified atrial fibrillation: Secondary | ICD-10-CM | POA: Diagnosis not present

## 2019-02-05 DIAGNOSIS — R0602 Shortness of breath: Secondary | ICD-10-CM | POA: Diagnosis not present

## 2019-02-05 DIAGNOSIS — I2510 Atherosclerotic heart disease of native coronary artery without angina pectoris: Secondary | ICD-10-CM | POA: Diagnosis not present

## 2019-02-05 DIAGNOSIS — R93429 Abnormal radiologic findings on diagnostic imaging of unspecified kidney: Secondary | ICD-10-CM | POA: Diagnosis not present

## 2019-02-05 DIAGNOSIS — I4891 Unspecified atrial fibrillation: Secondary | ICD-10-CM | POA: Diagnosis not present

## 2019-02-05 DIAGNOSIS — I10 Essential (primary) hypertension: Secondary | ICD-10-CM | POA: Diagnosis not present

## 2019-02-15 ENCOUNTER — Other Ambulatory Visit: Payer: Self-pay | Admitting: Family Medicine

## 2019-02-15 DIAGNOSIS — Z20822 Contact with and (suspected) exposure to covid-19: Secondary | ICD-10-CM

## 2019-02-15 DIAGNOSIS — Z20828 Contact with and (suspected) exposure to other viral communicable diseases: Secondary | ICD-10-CM

## 2019-02-16 ENCOUNTER — Telehealth: Payer: Self-pay | Admitting: Family Medicine

## 2019-02-16 NOTE — Chronic Care Management (AMB) (Signed)
°  Chronic Care Management   Outreach Note  02/16/2019 Name: Scott Guzman MRN: [id number] DOB: 31-Oct-1940  Referred by: Guadalupe Maple, MD Reason for referral : Chronic Care Management (Initial CCM outreach was unsuccessful. )   An unsuccessful telephone outreach was attempted today. The patient was referred to the case management team by for assistance with care management and care coordination.   Follow Up Plan: A HIPPA compliant phone message was left for the patient providing contact information and requesting a return call.  The care management team will reach out to the patient again over the next 7 days.  If patient returns call to provider office, please advise to call [place] at [place], [place] [address]  [place], [place] [address] Direct Dial: [email or web address]  Website: [email or web address]

## 2019-03-03 NOTE — Chronic Care Management (AMB) (Signed)
°  Chronic Care Management   Outreach Note  03/03/2019 Name: Scott Guzman MRN: [id number] DOB: 1940-08-15  Referred by: Guadalupe Maple, MD Reason for referral : Chronic Care Management (Initial CCM outreach was unsuccessful. ) and Chronic Care Management (Second CCM outreach was unsuccessful. )   A second unsuccessful telephone outreach was attempted today. The patient was referred to the case management team for assistance with care management and care coordination.   Follow Up Plan: A HIPPA compliant phone message was left for the patient providing contact information and requesting a return call.  The care management team will reach out to the patient again over the next 7 days.  If patient returns call to provider office, please advise to call [place] at [place], [place] [facility]  [place], [place] [address] Direct Dial: [email or web address]  Website: [email or web address]

## 2019-03-08 NOTE — Chronic Care Management (AMB) (Signed)
  Chronic Care Management   Note  03/08/2019 Name: Scott Guzman MRN: [id number] DOB: [birth year]  [name] is a 78 y.o. year old male who is a primary care patient of Crissman, Jeannette How, MD. I reached out to Delbert Harness by phone today in response to a referral sent by [name]'s health plan.     [name] was given information about Chronic Care Management services today including:  1. CCM service includes personalized support from designated clinical staff supervised by his physician, including individualized plan of care and coordination with other care providers 2. 24/7 contact phone numbers for assistance for urgent and routine care needs. 3. Service will only be billed when office clinical staff spend 20 minutes or more in a month to coordinate care. 4. Only one practitioner may furnish and bill the service in a calendar month. 5. The patient may stop CCM services at any time (effective at the end of the month) by phone call to the office staff. 6. The patient will be responsible for cost sharing (co-pay) of up to 20% of the service fee (after annual deductible is met).  Patient did not agree to enrollment in care management services and does not wish to consider at this time.  Follow up plan: The patient has been provided with contact information for the chronic care management team and has been advised to call with any health related questions or concerns.   [place], [place] [address] Direct Dial: [email or web address]  Website: [email or web address]

## 2019-05-07 DIAGNOSIS — I10 Essential (primary) hypertension: Secondary | ICD-10-CM | POA: Diagnosis not present

## 2019-05-07 DIAGNOSIS — I4891 Unspecified atrial fibrillation: Secondary | ICD-10-CM | POA: Diagnosis not present

## 2019-05-07 DIAGNOSIS — I2510 Atherosclerotic heart disease of native coronary artery without angina pectoris: Secondary | ICD-10-CM | POA: Diagnosis not present

## 2019-06-16 ENCOUNTER — Ambulatory Visit (INDEPENDENT_AMBULATORY_CARE_PROVIDER_SITE_OTHER): Payer: Medicare Other

## 2019-06-16 ENCOUNTER — Other Ambulatory Visit: Payer: Self-pay

## 2019-06-16 VITALS — BP 117/74 | HR 79 | Temp 97.5°F | Wt 136.4 lb

## 2019-06-16 DIAGNOSIS — Z0000 Encounter for general adult medical examination without abnormal findings: Secondary | ICD-10-CM
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
## 2019-06-16 NOTE — Patient Instructions (Signed)
Scott Guzman , Thank you for taking time to come for your Medicare Wellness Visit. I appreciate your ongoing commitment to your health goals. Please review the following plan we discussed and let me know if I can assist you in the future.   Screening recommendations/referrals: Colonoscopy: 05/05/2017 Recommended yearly ophthalmology/optometry visit for glaucoma screening and checkup Recommended yearly dental visit for hygiene and checkup  Vaccinations: Influenza vaccine: up to date  Pneumococcal vaccine: up to date  Tdap vaccine: due now Shingles vaccine: shingrix eligible  Covid-19: completed   Advanced directives: Please bring a copy of your health care power of attorney and living will to the office at your convenience.  Conditions/risks identified: none   Next appointment: Follow up in one year for your annual wellness visit   Preventive Care 65 Years and Older, Male Preventive care refers to lifestyle choices and visits with your health care provider that can promote health and wellness. What does preventive care include?  A yearly physical exam. This is also called an annual well check.  Dental exams once or twice a year.  Routine eye exams. Ask your health care provider how often you should have your eyes checked.  Personal lifestyle choices, including:  Daily care of your teeth and gums.  Regular physical activity.  Eating a healthy diet.  Avoiding tobacco and drug use.  Limiting alcohol use.  Practicing safe sex.  Taking low doses of aspirin every day.  Taking vitamin and mineral supplements as recommended by your health care provider. What happens during an annual well check? The services and screenings done by your health care provider during your annual well check will depend on your age, overall health, lifestyle risk factors, and family history of disease. Counseling  Your health care provider may ask you questions about your:  Alcohol use.  Tobacco  use.  Drug use.  Emotional well-being.  Home and relationship well-being.  Sexual activity.  Eating habits.  History of falls.  Memory and ability to understand (cognition).  Work and work Statistician. Screening  You may have the following tests or measurements:  Height, weight, and BMI.  Blood pressure.  Lipid and cholesterol levels. These may be checked every 5 years, or more frequently if you are over 63 years old.  Skin check.  Lung cancer screening. You may have this screening every year starting at age 43 if you have a 30-pack-year history of smoking and currently smoke or have quit within the past 15 years.  Fecal occult blood test (FOBT) of the stool. You may have this test every year starting at age 51.  Flexible sigmoidoscopy or colonoscopy. You may have a sigmoidoscopy every 5 years or a colonoscopy every 10 years starting at age 39.  Prostate cancer screening. Recommendations will vary depending on your family history and other risks.  Hepatitis C blood test.  Hepatitis B blood test.  Sexually transmitted disease (STD) testing.  Diabetes screening. This is done by checking your blood sugar (glucose) after you have not eaten for a while (fasting). You may have this done every 1-3 years.  Abdominal aortic aneurysm (AAA) screening. You may need this if you are a current or former smoker.  Osteoporosis. You may be screened starting at age 41 if you are at high risk. Talk with your health care provider about your test results, treatment options, and if necessary, the need for more tests. Vaccines  Your health care provider may recommend certain vaccines, such as:  Influenza vaccine. This  is recommended every year.  Tetanus, diphtheria, and acellular pertussis (Tdap, Td) vaccine. You may need a Td booster every 10 years.  Zoster vaccine. You may need this after age 32.  Pneumococcal 13-valent conjugate (PCV13) vaccine. One dose is recommended after age  58.  Pneumococcal polysaccharide (PPSV23) vaccine. One dose is recommended after age 15. Talk to your health care provider about which screenings and vaccines you need and how often you need them. This information is not intended to replace advice given to you by your health care provider. Make sure you discuss any questions you have with your health care provider. Document Released: 04/07/2015 Document Revised: 11/29/2015 Document Reviewed: 01/10/2015 Elsevier Interactive Patient Education  2017 [place] Prevention in the Home Falls can cause injuries. They can happen to people of all ages. There are many things you can do to make your home safe and to help prevent falls. What can I do on the outside of my home?  Regularly fix the edges of walkways and driveways and fix any cracks.  Remove anything that might make you trip as you walk through a door, such as a raised step or threshold.  Trim any bushes or trees on the path to your home.  Use bright outdoor lighting.  Clear any walking paths of anything that might make someone trip, such as rocks or tools.  Regularly check to see if handrails are loose or broken. Make sure that both sides of any steps have handrails.  Any raised decks and porches should have guardrails on the edges.  Have any leaves, snow, or ice cleared regularly.  Use sand or salt on walking paths during winter.  Clean up any spills in your garage right away. This includes oil or grease spills. What can I do in the bathroom?  Use night lights.  Install grab bars by the toilet and in the tub and shower. Do not use towel bars as grab bars.  Use non-skid mats or decals in the tub or shower.  If you need to sit down in the shower, use a plastic, non-slip stool.  Keep the floor dry. Clean up any water that spills on the floor as soon as it happens.  Remove soap buildup in the tub or shower regularly.  Attach bath mats securely with double-sided  non-slip rug tape.  Do not have throw rugs and other things on the floor that can make you trip. What can I do in the bedroom?  Use night lights.  Make sure that you have a light by your bed that is easy to reach.  Do not use any sheets or blankets that are too big for your bed. They should not hang down onto the floor.  Have a firm chair that has side arms. You can use this for support while you get dressed.  Do not have throw rugs and other things on the floor that can make you trip. What can I do in the kitchen?  Clean up any spills right away.  Avoid walking on wet floors.  Keep items that you use a lot in easy-to-reach places.  If you need to reach something above you, use a strong step stool that has a grab bar.  Keep electrical cords out of the way.  Do not use floor polish or wax that makes floors slippery. If you must use wax, use non-skid floor wax.  Do not have throw rugs and other things on the floor that can make you  trip. What can I do with my stairs?  Do not leave any items on the stairs.  Make sure that there are handrails on both sides of the stairs and use them. Fix handrails that are broken or loose. Make sure that handrails are as long as the stairways.  Check any carpeting to make sure that it is firmly attached to the stairs. Fix any carpet that is loose or worn.  Avoid having throw rugs at the top or bottom of the stairs. If you do have throw rugs, attach them to the floor with carpet tape.  Make sure that you have a light switch at the top of the stairs and the bottom of the stairs. If you do not have them, ask someone to add them for you. What else can I do to help prevent falls?  Wear shoes that:  Do not have high heels.  Have rubber bottoms.  Are comfortable and fit you well.  Are closed at the toe. Do not wear sandals.  If you use a stepladder:  Make sure that it is fully opened. Do not climb a closed stepladder.  Make sure that both  sides of the stepladder are locked into place.  Ask someone to hold it for you, if possible.  Clearly mark and make sure that you can see:  Any grab bars or handrails.  First and last steps.  Where the edge of each step is.  Use tools that help you move around (mobility aids) if they are needed. These include:  Canes.  Walkers.  Scooters.  Crutches.  Turn on the lights when you go into a dark area. Replace any light bulbs as soon as they burn out.  Set up your furniture so you have a clear path. Avoid moving your furniture around.  If any of your floors are uneven, fix them.  If there are any pets around you, be aware of where they are.  Review your medicines with your doctor. Some medicines can make you feel dizzy. This can increase your chance of falling. Ask your doctor what other things that you can do to help prevent falls. This information is not intended to replace advice given to you by your health care provider. Make sure you discuss any questions you have with your health care provider. Document Released: 01/05/2009 Document Revised: 08/17/2015 Document Reviewed: 04/15/2014 Elsevier Interactive Patient Education  2017 [employer].

## 2019-08-02 DIAGNOSIS — R0602 Shortness of breath: Secondary | ICD-10-CM | POA: Diagnosis not present

## 2019-08-02 DIAGNOSIS — I2510 Atherosclerotic heart disease of native coronary artery without angina pectoris: Secondary | ICD-10-CM | POA: Diagnosis not present

## 2019-08-02 DIAGNOSIS — I4891 Unspecified atrial fibrillation: Secondary | ICD-10-CM | POA: Diagnosis not present

## 2019-08-02 DIAGNOSIS — I10 Essential (primary) hypertension: Secondary | ICD-10-CM | POA: Diagnosis not present

## 2019-08-11 DIAGNOSIS — H6980 Other specified disorders of Eustachian tube, unspecified ear: Secondary | ICD-10-CM | POA: Diagnosis not present

## 2019-08-11 DIAGNOSIS — H60332 Swimmer's ear, left ear: Secondary | ICD-10-CM | POA: Diagnosis not present

## 2019-08-11 DIAGNOSIS — H6122 Impacted cerumen, left ear: Secondary | ICD-10-CM | POA: Diagnosis not present

## 2019-09-09 DIAGNOSIS — H6980 Other specified disorders of Eustachian tube, unspecified ear: Secondary | ICD-10-CM | POA: Diagnosis not present

## 2019-09-09 DIAGNOSIS — R42 Dizziness and giddiness: Secondary | ICD-10-CM | POA: Diagnosis not present

## 2020-01-07 ENCOUNTER — Other Ambulatory Visit: Payer: Self-pay

## 2020-01-07 DIAGNOSIS — R1084 Generalized abdominal pain: Secondary | ICD-10-CM | POA: Diagnosis not present

## 2020-01-07 DIAGNOSIS — K5900 Constipation, unspecified: Secondary | ICD-10-CM | POA: Diagnosis not present

## 2020-01-07 MED ORDER — MIRTAZAPINE 30 MG PO TABS: 30.0000 mg | ORAL_TABLET | Freq: Every day | ORAL | 0 refills | Status: DC

## 2020-02-06 DIAGNOSIS — Z20822 Contact with and (suspected) exposure to covid-19: Secondary | ICD-10-CM | POA: Diagnosis not present

## 2020-02-14 ENCOUNTER — Ambulatory Visit: Payer: Self-pay

## 2020-02-14 NOTE — Telephone Encounter (Signed)
Patient called and says that his wife has tested positive for COVID and he's sure she contracted it at the hair salon, because no one there has been tested or will get tested. He asks what can be done about that. I advised it's up to the individual and the owner to set the guidelines, he verbalized understanding and says that's what he figured. His wife says that his nose has started running and that he already tested negative. The patient says he's not sure of that test and wants a rapid test. I advised the only way is to go to the UC, because the tests will take 24-48 hours to result. He says they have been that route before and he was also tested at [facility], so he will go to the UC for a rapid test.  Reason for Disposition  General information question, no triage required and triager able to answer question  Answer Assessment - Initial Assessment Questions 1. REASON FOR CALL or QUESTION: "What is your reason for calling today?" or "How can I best help you?" or "What question do you have that I can help answer?"     Questions about rapid covid testing  Protocols used: INFORMATION ONLY CALL - NO TRIAGE-A-AH

## 2020-02-16 DIAGNOSIS — H04123 Dry eye syndrome of bilateral lacrimal glands: Secondary | ICD-10-CM | POA: Diagnosis not present

## 2020-02-16 DIAGNOSIS — H5203 Hypermetropia, bilateral: Secondary | ICD-10-CM | POA: Diagnosis not present

## 2020-02-16 DIAGNOSIS — H26493 Other secondary cataract, bilateral: Secondary | ICD-10-CM | POA: Diagnosis not present

## 2020-02-16 DIAGNOSIS — Z961 Presence of intraocular lens: Secondary | ICD-10-CM | POA: Diagnosis not present

## 2020-03-03 ENCOUNTER — Encounter: Payer: Self-pay | Admitting: Nurse Practitioner

## 2020-03-03 DIAGNOSIS — N183 Chronic kidney disease, stage 3 unspecified: Secondary | ICD-10-CM | POA: Insufficient documentation

## 2020-03-04 ENCOUNTER — Encounter: Payer: Self-pay | Admitting: Nurse Practitioner

## 2020-03-04 DIAGNOSIS — I700 Atherosclerosis of aorta: Secondary | ICD-10-CM | POA: Insufficient documentation

## 2020-03-04 DIAGNOSIS — D6869 Other thrombophilia: Secondary | ICD-10-CM | POA: Insufficient documentation

## 2020-03-06 ENCOUNTER — Other Ambulatory Visit: Payer: Self-pay

## 2020-03-06 ENCOUNTER — Encounter: Payer: Self-pay | Admitting: Nurse Practitioner

## 2020-03-06 ENCOUNTER — Ambulatory Visit (INDEPENDENT_AMBULATORY_CARE_PROVIDER_SITE_OTHER): Payer: Medicare Other | Admitting: Nurse Practitioner

## 2020-03-06 VITALS — BP 125/67 | HR 66 | Temp 97.9°F | Ht 68.66 in | Wt 134.0 lb

## 2020-03-06 DIAGNOSIS — I700 Atherosclerosis of aorta: Secondary | ICD-10-CM

## 2020-03-06 DIAGNOSIS — D6869 Other thrombophilia: Secondary | ICD-10-CM | POA: Diagnosis not present

## 2020-03-06 DIAGNOSIS — E7800 Pure hypercholesterolemia, unspecified: Secondary | ICD-10-CM | POA: Diagnosis not present

## 2020-03-06 DIAGNOSIS — R7989 Other specified abnormal findings of blood chemistry: Secondary | ICD-10-CM | POA: Diagnosis not present

## 2020-03-06 DIAGNOSIS — I4891 Unspecified atrial fibrillation: Secondary | ICD-10-CM

## 2020-03-06 DIAGNOSIS — N1832 Chronic kidney disease, stage 3b: Secondary | ICD-10-CM | POA: Diagnosis not present

## 2020-03-06 DIAGNOSIS — F5101 Primary insomnia: Secondary | ICD-10-CM

## 2020-03-06 DIAGNOSIS — E039 Hypothyroidism, unspecified: Secondary | ICD-10-CM | POA: Diagnosis not present

## 2020-03-06 MED ORDER — MIRTAZAPINE 30 MG PO TABS: 30.0000 mg | ORAL_TABLET | Freq: Every day | ORAL | 4 refills | Status: DC

## 2020-03-06 NOTE — Assessment & Plan Note (Signed)
Chronic, rate-controlled.  Continue collaboration with cardiology and current medication regimen as prescribed by them.

## 2020-03-06 NOTE — Assessment & Plan Note (Signed)
With Xarelto and A-Fib.  Continue current medication regimen and recommend patient immediately notify provider if any bleeding episodes or skin abrasions.

## 2020-03-06 NOTE — Assessment & Plan Note (Signed)
Chronic, ongoing.  Has benefit with Remeron.  Will continue current medication regimen and adjust as needed.  Refills sent in.  Return in 6 months to meet new PCP.

## 2020-03-06 NOTE — Assessment & Plan Note (Addendum)
Chronic, noted on past labs.  May benefit from addition of ACE or ARB in future.  Check BMP and TSH today.

## 2020-03-06 NOTE — Progress Notes (Signed)
BP 125/67   Pulse 66   Temp 97.9 F (36.6 C) (Oral)   Ht 5' 8.66" (1.744 m)   Wt 134 lb (60.8 kg)   SpO2 99%   BMI 19.98 kg/m    Subjective:    Patient ID: Scott Guzman, male    DOB: [birth year], 79 y.o.   MRN: [id number]  HPI: Scott Guzman is a 79 y.o. male  Chief Complaint  Patient presents with  . Medication Problem   ATRIAL FIBRILLATION Is followed by cardiology, Dr. Humphrey Rolls.  Last saw 3 months ago.  Continues on Xarelto and Amiodarone + Rosuvastatin.  Has aortic atherosclerosis noted on imaging 01/19/19. Atrial fibrillation status: stable Satisfied with current treatment: yes  Medication side effects:  no Medication compliance: good compliance Etiology of atrial fibrillation: unknown Palpitations:  no Chest pain:  no Dyspnea on exertion:  no Orthopnea:  no Syncope:  no Edema:  no Ventricular rate control: Amiodarone Anti-coagulation: long acting   CHRONIC KIDNEY DISEASE Last labs in office showed CRT 1.44 and GFR 47 in 2019.   CKD status: stable Medications renally dose: yes Previous renal evaluation: no Pneumovax:  Up to Date Influenza Vaccine:  refused  INSOMNIA Continues on Remeron, which works well. Duration: chronic Satisfied with sleep quality: yes Difficulty falling asleep: no Difficulty staying asleep: no Waking a few hours after sleep onset: no Early morning awakenings: no Daytime hypersomnolence: no Wakes feeling refreshed: yes Good sleep hygiene: yes Treatments attempted: multiple medications in past  Relevant past medical, surgical, family and social history reviewed and updated as indicated. Interim medical history since our last visit reviewed. Allergies and medications reviewed and updated.  Review of Systems  Constitutional: Negative for activity change, diaphoresis, fatigue and fever.  Respiratory: Negative for cough, chest tightness, shortness of breath and wheezing.   Cardiovascular: Negative for chest pain, palpitations and leg  swelling.  Gastrointestinal: Negative.   Endocrine: Negative for cold intolerance and heat intolerance.  Neurological: Negative.   Psychiatric/Behavioral: Negative.     Per HPI unless specifically indicated above     Objective:    BP 125/67   Pulse 66   Temp 97.9 F (36.6 C) (Oral)   Ht 5' 8.66" (1.744 m)   Wt 134 lb (60.8 kg)   SpO2 99%   BMI 19.98 kg/m   Wt Readings from Last 3 Encounters:  03/06/20 134 lb (60.8 kg)  06/16/19 136 lb 6.4 oz (61.9 kg)  12/17/18 130 lb (59 kg)    Physical Exam Vitals and nursing note reviewed.  Constitutional:      General: He is awake. He is not in acute distress.    Appearance: He is well-developed and well-groomed. He is not ill-appearing.  HENT:     Head: Normocephalic and atraumatic.     Right Ear: Hearing normal. No drainage.     Left Ear: Hearing normal. No drainage.  Eyes:     General: Lids are normal.        Right eye: No discharge.        Left eye: No discharge.     Conjunctiva/sclera: Conjunctivae normal.     Pupils: Pupils are equal, round, and reactive to light.  Neck:     Thyroid: No thyromegaly.     Vascular: No carotid bruit.     Trachea: Trachea normal.  Cardiovascular:     Rate and Rhythm: Normal rate and regular rhythm.     Heart sounds: Normal heart sounds, S1 normal and S2  normal. No murmur heard. No gallop.   Pulmonary:     Effort: Pulmonary effort is normal. No accessory muscle usage or respiratory distress.     Breath sounds: Normal breath sounds.  Abdominal:     General: Bowel sounds are normal.     Palpations: Abdomen is soft.  Musculoskeletal:        General: Normal range of motion.     Cervical back: Normal range of motion and neck supple.     Right lower leg: No edema.     Left lower leg: No edema.  Skin:    General: Skin is warm and dry.     Capillary Refill: Capillary refill takes less than 2 seconds.     Findings: No rash.  Neurological:     Mental Status: He is alert and oriented to  person, place, and time.     Deep Tendon Reflexes: Reflexes are normal and symmetric.  Psychiatric:        Attention and Perception: Attention normal.        Mood and Affect: Mood normal.        Speech: Speech normal.        Behavior: Behavior normal. Behavior is cooperative.        Thought Content: Thought content normal.     Results for orders placed or performed in visit on 03/26/70  Basic metabolic panel  Result Value Ref Range   Glucose 86 65 - 99 mg/dL   BUN 26 8 - 27 mg/dL   Creatinine, Ser 1.51 (H) 0.76 - 1.27 mg/dL   GFR calc non Af Amer 44 (L) >59 mL/min/1.73   GFR calc Af Amer 51 (L) >59 mL/min/1.73   BUN/Creatinine Ratio 17 10 - 24   Sodium 141 134 - 144 mmol/L   Potassium 4.9 3.5 - 5.2 mmol/L   Chloride 103 96 - 106 mmol/L   CO2 25 20 - 29 mmol/L   Calcium 9.4 8.6 - 10.2 mg/dL      Assessment & Plan:   Problem List Items Addressed This Visit      Cardiovascular and Mediastinum   Atrial fibrillation with controlled ventricular response ([name])    Chronic, rate-controlled.  Continue collaboration with cardiology and current medication regimen as prescribed by them.      Aortic atherosclerosis ([place]) - Primary    Noted on CXR 01/19/19 --  Educated him on this and recommend he continue daily statin + Rosuvastatin.        Genitourinary   CKD (chronic kidney disease) stage 3, GFR 30-59 ml/min ([name])    Chronic, noted on past labs.  May benefit from addition of ACE or ARB in future.  Check BMP and TSH today.      Relevant Orders   Basic metabolic panel   TSH     Hematopoietic and Hemostatic   Other thrombophilia ([place])    With Xarelto and A-Fib.  Continue current medication regimen and recommend patient immediately notify provider if any bleeding episodes or skin abrasions.        Other   Hypercholesterolemia    Chronic, ongoing.  Continue current medication regimen and adjust as needed.  Lipid panel today.      Relevant Orders   Lipid Panel w/o Chol/HDL  Ratio   Insomnia    Chronic, ongoing.  Has benefit with Remeron.  Will continue current medication regimen and adjust as needed.  Refills sent in.  Return in 6 months to meet new PCP.  Follow up plan: Return in about 6 months (around 09/04/2020) for A-fib, HLD, CKD, INSOMNIA -- meet new PCP.

## 2020-03-06 NOTE — Assessment & Plan Note (Signed)
Chronic, ongoing.  Continue current medication regimen and adjust as needed. Lipid panel today. 

## 2020-03-06 NOTE — Assessment & Plan Note (Addendum)
Noted on CXR 01/19/19 --  Educated him on this and recommend he continue daily statin + Rosuvastatin.

## 2020-03-06 NOTE — Patient Instructions (Signed)
DASH Eating Plan DASH stands for "Dietary Approaches to Stop Hypertension." The DASH eating plan is a healthy eating plan that has been shown to reduce high blood pressure (hypertension). It may also reduce your risk for type 2 diabetes, heart disease, and stroke. The DASH eating plan may also help with weight loss. What are tips for following this plan?  General guidelines  Avoid eating more than 2,300 mg (milligrams) of salt (sodium) a day. If you have hypertension, you may need to reduce your sodium intake to 1,500 mg a day.  Limit alcohol intake to no more than 1 drink a day for nonpregnant women and 2 drinks a day for men. One drink equals 12 oz of beer, 5 oz of wine, or 1 oz of hard liquor.  Work with your health care provider to maintain a healthy body weight or to lose weight. Ask what an ideal weight is for you.  Get at least 30 minutes of exercise that causes your heart to beat faster (aerobic exercise) most days of the week. Activities may include walking, swimming, or biking.  Work with your health care provider or diet and nutrition specialist (dietitian) to adjust your eating plan to your individual calorie needs. Reading food labels   Check food labels for the amount of sodium per serving. Choose foods with less than 5 percent of the Daily Value of sodium. Generally, foods with less than 300 mg of sodium per serving fit into this eating plan.  To find whole grains, look for the word "whole" as the first word in the ingredient list. Shopping  Buy products labeled as "low-sodium" or "no salt added."  Buy fresh foods. Avoid canned foods and premade or frozen meals. Cooking  Avoid adding salt when cooking. Use salt-free seasonings or herbs instead of table salt or sea salt. Check with your health care provider or pharmacist before using salt substitutes.  Do not fry foods. Cook foods using healthy methods such as baking, boiling, grilling, and broiling instead.  Cook with  heart-healthy oils, such as olive, canola, soybean, or sunflower oil. Meal planning  Eat a balanced diet that includes: ? 5 or more servings of fruits and vegetables each day. At each meal, try to fill half of your plate with fruits and vegetables. ? Up to 6-8 servings of whole grains each day. ? Less than 6 oz of lean meat, poultry, or fish each day. A 3-oz serving of meat is about the same size as a deck of cards. One egg equals 1 oz. ? 2 servings of low-fat dairy each day. ? A serving of nuts, seeds, or beans 5 times each week. ? Heart-healthy fats. Healthy fats called Omega-3 fatty acids are found in foods such as flaxseeds and coldwater fish, like sardines, salmon, and mackerel.  Limit how much you eat of the following: ? Canned or prepackaged foods. ? Food that is high in trans fat, such as fried foods. ? Food that is high in saturated fat, such as fatty meat. ? Sweets, desserts, sugary drinks, and other foods with added sugar. ? Full-fat dairy products.  Do not salt foods before eating.  Try to eat at least 2 vegetarian meals each week.  Eat more home-cooked food and less restaurant, buffet, and fast food.  When eating at a restaurant, ask that your food be prepared with less salt or no salt, if possible. What foods are recommended? The items listed may not be a complete list. Talk with your dietitian about   what dietary choices are best for you. Grains Whole-grain or whole-wheat bread. Whole-grain or whole-wheat pasta. Brown rice. Oatmeal. Quinoa. Bulgur. Whole-grain and low-sodium cereals. Pita bread. Low-fat, low-sodium crackers. Whole-wheat flour tortillas. Vegetables Fresh or frozen vegetables (raw, steamed, roasted, or grilled). Low-sodium or reduced-sodium tomato and vegetable juice. Low-sodium or reduced-sodium tomato sauce and tomato paste. Low-sodium or reduced-sodium canned vegetables. Fruits All fresh, dried, or frozen fruit. Canned fruit in natural juice (without  added sugar). Meat and other protein foods Skinless chicken or turkey. Ground chicken or turkey. Pork with fat trimmed off. Fish and seafood. Egg whites. Dried beans, peas, or lentils. Unsalted nuts, nut butters, and seeds. Unsalted canned beans. Lean cuts of beef with fat trimmed off. Low-sodium, lean deli meat. Dairy Low-fat (1%) or fat-free (skim) milk. Fat-free, low-fat, or reduced-fat cheeses. Nonfat, low-sodium ricotta or cottage cheese. Low-fat or nonfat yogurt. Low-fat, low-sodium cheese. Fats and oils Soft margarine without trans fats. Vegetable oil. Low-fat, reduced-fat, or light mayonnaise and salad dressings (reduced-sodium). Canola, safflower, olive, soybean, and sunflower oils. Avocado. Seasoning and other foods Herbs. Spices. Seasoning mixes without salt. Unsalted popcorn and pretzels. Fat-free sweets. What foods are not recommended? The items listed may not be a complete list. Talk with your dietitian about what dietary choices are best for you. Grains Baked goods made with fat, such as croissants, muffins, or some breads. Dry pasta or rice meal packs. Vegetables Creamed or fried vegetables. Vegetables in a cheese sauce. Regular canned vegetables (not low-sodium or reduced-sodium). Regular canned tomato sauce and paste (not low-sodium or reduced-sodium). Regular tomato and vegetable juice (not low-sodium or reduced-sodium). Pickles. Olives. Fruits Canned fruit in a light or heavy syrup. Fried fruit. Fruit in cream or butter sauce. Meat and other protein foods Fatty cuts of meat. Ribs. Fried meat. Bacon. Sausage. Bologna and other processed lunch meats. Salami. Fatback. Hotdogs. Bratwurst. Salted nuts and seeds. Canned beans with added salt. Canned or smoked fish. Whole eggs or egg yolks. Chicken or turkey with skin. Dairy Whole or 2% milk, cream, and half-and-half. Whole or full-fat cream cheese. Whole-fat or sweetened yogurt. Full-fat cheese. Nondairy creamers. Whipped toppings.  Processed cheese and cheese spreads. Fats and oils Butter. Stick margarine. Lard. Shortening. Ghee. Bacon fat. Tropical oils, such as coconut, palm kernel, or palm oil. Seasoning and other foods Salted popcorn and pretzels. Onion salt, garlic salt, seasoned salt, table salt, and sea salt. Worcestershire sauce. Tartar sauce. Barbecue sauce. Teriyaki sauce. Soy sauce, including reduced-sodium. Steak sauce. Canned and packaged gravies. Fish sauce. Oyster sauce. Cocktail sauce. Horseradish that you find on the shelf. Ketchup. Mustard. Meat flavorings and tenderizers. Bouillon cubes. Hot sauce and Tabasco sauce. Premade or packaged marinades. Premade or packaged taco seasonings. Relishes. Regular salad dressings. Where to find more information:  National Heart, Lung, and Blood Institute: www.nhlbi.nih.gov  American Heart Association: www.heart.org Summary  The DASH eating plan is a healthy eating plan that has been shown to reduce high blood pressure (hypertension). It may also reduce your risk for type 2 diabetes, heart disease, and stroke.  With the DASH eating plan, you should limit salt (sodium) intake to 2,300 mg a day. If you have hypertension, you may need to reduce your sodium intake to 1,500 mg a day.  When on the DASH eating plan, aim to eat more fresh fruits and vegetables, whole grains, lean proteins, low-fat dairy, and heart-healthy fats.  Work with your health care provider or diet and nutrition specialist (dietitian) to adjust your eating plan to your   individual calorie needs. This information is not intended to replace advice given to you by your health care provider. Make sure you discuss any questions you have with your health care provider. Document Revised: 02/21/2017 Document Reviewed: 03/04/2016 Elsevier Patient Education  2020 [employer]  

## 2020-03-07 LAB — LIPID PANEL W/O CHOL/HDL RATIO
Cholesterol, Total: 140 mg/dL (ref 100–199)
HDL: 74 mg/dL (ref >39)
LDL Chol Calc (NIH): 55 mg/dL (ref 0–99)
Triglycerides: 50 mg/dL (ref 0–149)
VLDL Cholesterol Cal: 11 mg/dL (ref 5–40)

## 2020-03-07 LAB — BASIC METABOLIC PANEL
BUN/Creatinine Ratio: 17 (ref 10–24)
BUN: 26 mg/dL (ref 8–27)
CO2: 26 mmol/L (ref 20–29)
Calcium: 9.1 mg/dL (ref 8.6–10.2)
Chloride: 103 mmol/L (ref 96–106)
Creatinine, Ser: 1.54 mg/dL — ABNORMAL HIGH (ref 0.76–1.27)
GFR calc Af Amer: 50 mL/min/{1.73_m2} — ABNORMAL LOW (ref >59)
GFR calc non Af Amer: 43 mL/min/{1.73_m2} — ABNORMAL LOW (ref >59)
Glucose: 99 mg/dL (ref 65–99)
Potassium: 4.4 mmol/L (ref 3.5–5.2)
Sodium: 139 mmol/L (ref 134–144)

## 2020-03-07 LAB — TSH: TSH: 6.62 u[IU]/mL — ABNORMAL HIGH (ref 0.450–4.500)

## 2020-03-07 NOTE — Addendum Note (Signed)
Addended by: Marnee Guarneri T on: 03/07/2020 09:18 AM   Modules accepted: Orders

## 2020-03-07 NOTE — Addendum Note (Signed)
Addended by: Marnee Guarneri T on: 03/07/2020 09:13 AM   Modules accepted: Orders

## 2020-03-07 NOTE — Progress Notes (Signed)
Contacted via Scott Guzman -- needs lab visit only in 6 weeks to recheck thyroid.   Good morning Scott Guzman -- your labs have returned. You continue to show some stable kidney disease with no decline.  We will continue to monitor this and if ever any decline will send to kidney provider.  Cholesterol levels are at goal.  Your thyroid testing, TSH, was elevated.  We want this between 0.4 and 4.  I am going to check a Free T4 on your labs from yesterday to further assess, but would like you to schedule a lab visit only for 6 weeks to recheck this.  Can you please schedule.  Any questions? Keep being awesome!!  Thank you for allowing me to participate in your care. Kindest regards, Sami Roes

## 2020-03-08 LAB — T4, FREE: Free T4: 1.44 ng/dL (ref 0.82–1.77)

## 2020-03-08 LAB — SPECIMEN STATUS REPORT

## 2020-05-24 ENCOUNTER — Emergency Department: Payer: Medicare Other

## 2020-05-24 ENCOUNTER — Emergency Department
Admission: EM | Admit: 2020-05-24 | Discharge: 2020-05-24 | Disposition: A | Discharge status: Home / Self Care | Payer: Medicare Other | Attending: Emergency Medicine | Admitting: Emergency Medicine

## 2020-05-24 ENCOUNTER — Other Ambulatory Visit: Payer: Self-pay

## 2020-05-24 ENCOUNTER — Encounter: Payer: Self-pay | Admitting: Emergency Medicine

## 2020-05-24 DIAGNOSIS — Z85038 Personal history of other malignant neoplasm of large intestine: Secondary | ICD-10-CM | POA: Insufficient documentation

## 2020-05-24 DIAGNOSIS — I129 Hypertensive chronic kidney disease with stage 1 through stage 4 chronic kidney disease, or unspecified chronic kidney disease: Secondary | ICD-10-CM | POA: Diagnosis not present

## 2020-05-24 DIAGNOSIS — J439 Emphysema, unspecified: Secondary | ICD-10-CM | POA: Diagnosis not present

## 2020-05-24 DIAGNOSIS — R42 Dizziness and giddiness: Secondary | ICD-10-CM

## 2020-05-24 DIAGNOSIS — N183 Chronic kidney disease, stage 3 unspecified: Secondary | ICD-10-CM | POA: Diagnosis not present

## 2020-05-24 DIAGNOSIS — Z7901 Long term (current) use of anticoagulants: Secondary | ICD-10-CM | POA: Diagnosis not present

## 2020-05-24 DIAGNOSIS — I10 Essential (primary) hypertension: Secondary | ICD-10-CM | POA: Diagnosis not present

## 2020-05-24 DIAGNOSIS — N179 Acute kidney failure, unspecified: Secondary | ICD-10-CM | POA: Diagnosis not present

## 2020-05-24 DIAGNOSIS — I2510 Atherosclerotic heart disease of native coronary artery without angina pectoris: Secondary | ICD-10-CM | POA: Diagnosis not present

## 2020-05-24 DIAGNOSIS — R531 Weakness: Secondary | ICD-10-CM

## 2020-05-24 DIAGNOSIS — I6782 Cerebral ischemia: Secondary | ICD-10-CM | POA: Diagnosis not present

## 2020-05-24 LAB — CBC
HCT: 37.4 % — ABNORMAL LOW (ref 39.0–52.0)
Hemoglobin: 12.2 g/dL — ABNORMAL LOW (ref 13.0–17.0)
MCH: 30.2 pg (ref 26.0–34.0)
MCHC: 32.6 g/dL (ref 30.0–36.0)
MCV: 92.6 fL (ref 80.0–100.0)
Platelets: 170 10*3/uL (ref 150–400)
RBC: 4.04 MIL/uL — ABNORMAL LOW (ref 4.22–5.81)
RDW: 13.3 % (ref 11.5–15.5)
WBC: 6.5 10*3/uL (ref 4.0–10.5)
nRBC: 0 % (ref 0.0–0.2)

## 2020-05-24 LAB — BASIC METABOLIC PANEL
Anion gap: 9 (ref 5–15)
BUN: 33 mg/dL — ABNORMAL HIGH (ref 8–23)
CO2: 26 mmol/L (ref 22–32)
Calcium: 9.1 mg/dL (ref 8.9–10.3)
Chloride: 105 mmol/L (ref 98–111)
Creatinine, Ser: 2.26 mg/dL — ABNORMAL HIGH (ref 0.61–1.24)
GFR, Estimated: 29 mL/min — ABNORMAL LOW (ref >60)
Glucose, Bld: 109 mg/dL — ABNORMAL HIGH (ref 70–99)
Potassium: 4.9 mmol/L (ref 3.5–5.1)
Sodium: 140 mmol/L (ref 135–145)

## 2020-05-24 LAB — TROPONIN I (HIGH SENSITIVITY)
Troponin I (High Sensitivity): 21 ng/L — ABNORMAL HIGH (ref <18)
Troponin I (High Sensitivity): 22 ng/L — ABNORMAL HIGH (ref <18)

## 2020-05-24 LAB — T4, FREE: Free T4: 1.21 ng/dL — ABNORMAL HIGH (ref 0.61–1.12)

## 2020-05-24 LAB — TSH: TSH: 7.599 u[IU]/mL — ABNORMAL HIGH (ref 0.350–4.500)

## 2020-05-24 MED ORDER — LACTATED RINGERS IV BOLUS
1000.0000 mL | Freq: Once | INTRAVENOUS | Status: AC
  Administered 2020-05-24: 1000 mL via INTRAVENOUS

## 2020-05-24 NOTE — ED Provider Notes (Signed)
[facility] Emergency Department Provider Note  ____________________________________________   Event Date/Time   First MD Initiated Contact with Patient 05/24/20 2010     (approximate)  I have reviewed the triage vital signs and the nursing notes.   HISTORY  Chief Complaint Weakness   HPI Scott Guzman is a 80 y.o. male with a past medical history of colon cancer status post resection, A. fib rhythm controlled on amiodarone anticoagulated on Xarelto, CAD, CKD, BPH, HTN, HDL and chronic dizziness as well as mild cognitive impairment and family history of Parkinson's disease previously seen by neurology for the symptoms in 2018 who presents accompanied by his wife because he feels like his dizziness has not gotten any better.  He states his symptoms have been going on at least a year and that he has no new symptoms today.  He notes he does not think he has been drinking enough fluids and thinks he might be a little dehydrated.  He denies any headache, earache, vomiting, diarrhea, dysuria, chest pain, cough, shortness of breath, extremity pain or focal weakness numbness or tingling any other acute complaints.  He states his dizziness is sometimes precipitated by abrupt standing but he describes it as intermittently lightheaded and spinning sensation.  It is not clearly precipitated by movement of his head and sometimes will happen while he is lying still.  He states he does not currently feel dizzy.  No other clear alleviating aggravating symptoms         Past Medical History:  Diagnosis Date  . Colon cancer ([place]) 2000   He states he had a partial colon resection.   . Coronary artery disease   . Dysphagia   . Dyspnea   . Dysrhythmia    Atrial Fibrillation  . Hyperlipidemia   . Hypertension   . PVC (premature ventricular contraction)   . Temporal arteritis [facility])     Patient Active Problem List   Diagnosis Date Noted  . Other thrombophilia ([place])  03/04/2020  . Aortic atherosclerosis ([place]) 03/04/2020  . CKD (chronic kidney disease) stage 3, GFR 30-59 ml/min ([name]) 03/03/2020  . Cerebral ventriculomegaly 06/11/2017  . Atrial fibrillation with controlled ventricular response ([place]) 06/03/2017  . History of colon cancer 02/07/2017  . History of compression fracture of spine 01/13/2017  . Temporal arteritis ([place]) 03/12/2016  . BPH (benign prostatic hyperplasia) 03/12/2016  . Elevated erythrocyte sedimentation rate 01/29/2016  . Insomnia 10/31/2014  . Hypercholesterolemia 10/13/2014    Past Surgical History:  Procedure Laterality Date  . BACK SURGERY     Kyphoplasty T12  (Nov. 2018)  . COLON SURGERY    . COLONOSCOPY WITH PROPOFOL N/A 05/05/2017   Procedure: COLONOSCOPY WITH PROPOFOL;  Surgeon: Manya Silvas, MD;  Location: [facility] ENDOSCOPY;  Service: Endoscopy;  Laterality: N/A;  . KYPHOPLASTY N/A 01/23/2017   Procedure: [id number];  Surgeon: [name], MD;  Location: [facility] ORS;  Service: Orthopedics;  Laterality: N/A;  . LEFT HEART CATH AND CORONARY ANGIOGRAPHY Right 12/03/2018   Procedure: LEFT HEART CATH AND CORONARY ANGIOGRAPHY;  Surgeon: [name], MD;  Location: [place] CV LAB;  Service: Cardiovascular;  Laterality: Right;    Prior to Admission medications   Medication Sig Start Date End Date Taking? Authorizing Provider  amiodarone (PACERONE) 200 MG tablet Take 200 mg by mouth every other day.     [provider]  mirtazapine (REMERON) 30 MG tablet Take 1 tablet (30 mg total) by mouth at bedtime. Please schedule office  visit before any future refills. 03/06/20   Cannady, Henrine Screws T, NP  Rivaroxaban (XARELTO) 15 MG TABS tablet Take 15 mg by mouth daily with supper.    [provider]  rosuvastatin (CRESTOR) 20 MG tablet Take 20 mg by mouth at bedtime. 06/03/19   [provider]  vitamin B-12 (CYANOCOBALAMIN) 1000 MCG tablet Take 1,000 mcg by mouth daily.     [provider]     Allergies Patient has no known allergies.  Family History  Problem Relation Age of Onset  . Colon cancer Brother     Social History Social History   Tobacco Use  . Smoking status: Never Smoker  . Smokeless tobacco: Never Used  Vaping Use  . Vaping Use: Never used  Substance Use Topics  . Alcohol use: Yes    Alcohol/week: 4.0 standard drinks    Types: 4 Cans of beer per week  . Drug use: No    Review of Systems  Review of Systems  Constitutional: Positive for malaise/fatigue. Negative for chills and fever.  HENT: Negative for sore throat.   Eyes: Negative for pain.  Respiratory: Negative for cough and stridor.   Cardiovascular: Negative for chest pain.  Gastrointestinal: Negative for vomiting.  Genitourinary: Negative for dysuria.  Musculoskeletal: Negative for myalgias.  Skin: Negative for rash.  Neurological: Positive for dizziness and weakness. Negative for seizures, loss of consciousness and headaches.  Psychiatric/Behavioral: Negative for suicidal ideas.  All other systems reviewed and are negative.     ____________________________________________   PHYSICAL EXAM:  VITAL SIGNS: ED Triage Vitals [05/24/20 1723]  Enc Vitals Group     BP 97/73     Pulse Rate 61     Resp 16     Temp 98.3 F (36.8 C)     Temp Source Oral     SpO2 97 %     Weight 145 lb (65.8 kg)     Height 5\' 9"  (1.753 m)     Head Circumference      Peak Flow      Pain Score 0     Pain Loc      Pain Edu?      Excl. in [place]?    Vitals:   05/24/20 2003 05/24/20 2210  BP:  (!) 173/89  Pulse:  (!) 55  Resp:  18  Temp:    SpO2: 98% 99%   Physical Exam Vitals and nursing note reviewed.  Constitutional:      Appearance: He is well-developed and well-nourished.  HENT:     Head: Normocephalic and atraumatic.     Right Ear: External ear normal.     Left Ear: External ear normal.     Nose: Nose normal.  Eyes:     Conjunctiva/sclera: Conjunctivae normal.  Cardiovascular:      Rate and Rhythm: Normal rate and regular rhythm.     Heart sounds: No murmur heard.   Pulmonary:     Effort: Pulmonary effort is normal. No respiratory distress.     Breath sounds: Normal breath sounds.  Abdominal:     Palpations: Abdomen is soft.     Tenderness: There is no abdominal tenderness.  Musculoskeletal:        General: No edema.     Cervical back: Neck supple.     Right lower leg: No edema.     Left lower leg: No edema.  Skin:    General: Skin is warm and dry.  Neurological:     Mental  Status: He is alert and oriented to person, place, and time.  Psychiatric:        Mood and Affect: Mood and affect and mood normal.     Cranial nerves II through XII grossly intact.  No pronator drift.  No finger dysmetria.  Symmetric 5/5 strength of all extremities.  Sensation intact to light touch in all extremities.  Unremarkable unassisted gait.  ____________________________________________   LABS (all labs ordered are listed, but only abnormal results are displayed)  Labs Reviewed  BASIC METABOLIC PANEL - Abnormal; Notable for the following components:      Result Value   Glucose, Bld 109 (*)    BUN 33 (*)    Creatinine, Ser 2.26 (*)    GFR, Estimated 29 (*)    All other components within normal limits  CBC - Abnormal; Notable for the following components:   RBC 4.04 (*)    Hemoglobin 12.2 (*)    HCT 37.4 (*)    All other components within normal limits  TSH - Abnormal; Notable for the following components:   TSH 7.599 (*)    All other components within normal limits  T4, FREE - Abnormal; Notable for the following components:   Free T4 1.21 (*)    All other components within normal limits  TROPONIN I (HIGH SENSITIVITY) - Abnormal; Notable for the following components:   Troponin I (High Sensitivity) 21 (*)    All other components within normal limits  TROPONIN I (HIGH SENSITIVITY) - Abnormal; Notable for the following components:   Troponin I (High Sensitivity) 22  (*)    All other components within normal limits  URINALYSIS, COMPLETE (UACMP) WITH MICROSCOPIC  HEPATIC FUNCTION PANEL   ____________________________________________  EKG  Sinus rhythm with a ventricular rate of 64, normal axis, incomplete right bundle branch block no clear evidence of acute ischemia or other significant arrhythmia.  ____________________________________________  RADIOLOGY  ED MD interpretation: CT head shows no evidence of acute intracranial process.  Chest x-ray is unremarkable.   Official radiology report(s): DG Chest 2 View  Result Date: 05/24/2020 CLINICAL DATA:  Weakness EXAM: CHEST - 2 VIEW COMPARISON:  01/19/2019 FINDINGS: The lungs are hyperexpanded. Emphysematous changes are suspected. Aortic calcifications are noted. There is no pneumothorax. No large pleural effusion. No focal infiltrate. No acute osseous abnormality. The patient is status post prior vertebral augmentation of the lower thoracic spine. IMPRESSION: No active cardiopulmonary disease. Electronically Signed   By: Constance Holster M.D.   On: 05/24/2020 20:39   CT Head Wo Contrast  Result Date: 05/24/2020 CLINICAL DATA:  Increased weakness.  Possible stroke. EXAM: CT HEAD WITHOUT CONTRAST TECHNIQUE: Contiguous axial images were obtained from the base of the skull through the vertex without intravenous contrast. COMPARISON:  Brain MRI dated 05/21/2017. FINDINGS: Brain: Ventricles appears stable. Chronic small vessel ischemic changes again noted within the bilateral periventricular and subcortical white matter regions. No mass, hemorrhage, edema or other evidence of acute parenchymal abnormality. No extra-axial hemorrhage. Vascular: Chronic calcified atherosclerotic changes of the large vessels at the skull base. No unexpected hyperdense vessel. Skull: Normal. Negative for fracture or focal lesion. Sinuses/Orbits: No acute finding. Other: None. IMPRESSION: 1. No acute findings. No intracranial mass,  hemorrhage or edema. 2. Chronic small vessel ischemic changes in the white matter. Electronically Signed   By: Franki Cabot M.D.   On: 05/24/2020 21:23    ____________________________________________   PROCEDURES  Procedure(s) performed (including Critical Care):  .1-3 Lead EKG Interpretation Performed by: Tamala Julian,  Ida Rogue, MD Authorized by: Lucrezia Starch, MD     Interpretation: normal     ECG rate assessment: normal     Rhythm: sinus rhythm     Ectopy: none     Conduction: normal       ____________________________________________   INITIAL IMPRESSION / ASSESSMENT AND PLAN / ED COURSE      Patient presents accompanied by his wife for assessment approximately 1 year of intermittent dizziness and generalized weakness.  It is not clearly described as vertigo or lightheadedness that is not clearly positional.  On arrival patient's BP is 97/73 although on recheck it is 173/89.  He is otherwise has stable vital signs on room air.  He has a nonfocal neuro exam and is able to ambulate without assistance although has slightly awkward gait.  Patient does appear dry on exam although denies any recent traumatic injuries or falls.  No historical or exam factors to suggest acute infectious process or toxic ingestion.  No findings on exam or CT to suggest CVA or acute intracranial process.  BMP shows evidence of an AKI with a creatinine of 2.26 compared to 1.52 months ago.  No other significant electrode or metabolic derangements.  CBC shows no leukocytosis and has a hemoglobin of 12.2 compared to 13.32 years ago.  Will ECG does not show evidence of acute ischemia there is a right bundle branch block however given 2 nonelevated troponins are very low suspicion for ACS.  TSH is elevated at 7.6 and free T4 is slightly elevated at 1.2 which is somewhat mixed picture.  Overall unclear etiology for patient's symptoms although he was given some IV fluids for some mild dehydration.  However given  duration of symptoms with otherwise reassuring exam work-up I believe he is safe for continued outpatient PCP follow-up.  Advised him that he should have his blood pressure rechecked by his cardiologist and he should have his kidney function rechecked in a couple days by his PCP to ensure he was improving.  Also advised him that he should follow-up again with neurologist as it seems on review of records he was seen for similar symptoms several years ago and at that time there were concerns for possible NPH versus cognitive disorder.  Explained this to patient and wife.  Patient discharged stable condition.  Strict return precautions advised and discussed.    ____________________________________________   FINAL CLINICAL IMPRESSION(S) / ED DIAGNOSES  Final diagnoses:  Weakness  Dizziness  Hypertension, unspecified type  AKI (acute kidney injury) ([place])    Medications  lactated ringers bolus 1,000 mL (0 mLs Intravenous Stopped 05/24/20 2232)     ED Discharge Orders    None       Note:  This document was prepared using Dragon voice recognition software and may include unintentional dictation errors.   Lucrezia Starch, MD 05/24/20 2318

## 2020-05-24 NOTE — ED Triage Notes (Signed)
Pt brought over to the ED from [facility] for weakness and hypotension.  Pt states he has had increased weakness over a year.  Pt states he doesn't know what is causing it.  Nothing new made him come in today, but his wife informed him he was coming.  Pt states that he does have dizziness especially with standing but then will go away after 30 seconds or so.  Pt neurologically intact at this time.  Pt states he has not talked to his PCP about this ongoing problem because he just got a new PCP.

## 2020-05-25 DIAGNOSIS — I4891 Unspecified atrial fibrillation: Secondary | ICD-10-CM | POA: Diagnosis not present

## 2020-05-25 DIAGNOSIS — I10 Essential (primary) hypertension: Secondary | ICD-10-CM | POA: Diagnosis not present

## 2020-05-25 DIAGNOSIS — I2510 Atherosclerotic heart disease of native coronary artery without angina pectoris: Secondary | ICD-10-CM | POA: Diagnosis not present

## 2020-05-25 DIAGNOSIS — R42 Dizziness and giddiness: Secondary | ICD-10-CM | POA: Diagnosis not present

## 2020-05-25 LAB — HEPATIC FUNCTION PANEL
ALT: 20 U/L (ref 0–44)
AST: 28 U/L (ref 15–41)
Albumin: 4.2 g/dL (ref 3.5–5.0)
Alkaline Phosphatase: 50 U/L (ref 38–126)
Bilirubin, Direct: 0.1 mg/dL (ref 0.0–0.2)
Indirect Bilirubin: 0.6 mg/dL (ref 0.3–0.9)
Total Bilirubin: 0.7 mg/dL (ref 0.3–1.2)
Total Protein: 6.8 g/dL (ref 6.5–8.1)

## 2020-05-31 DIAGNOSIS — R42 Dizziness and giddiness: Secondary | ICD-10-CM | POA: Diagnosis not present

## 2020-06-06 DIAGNOSIS — I10 Essential (primary) hypertension: Secondary | ICD-10-CM | POA: Diagnosis not present

## 2020-06-09 DIAGNOSIS — I6523 Occlusion and stenosis of bilateral carotid arteries: Secondary | ICD-10-CM | POA: Diagnosis not present

## 2020-06-09 DIAGNOSIS — I4891 Unspecified atrial fibrillation: Secondary | ICD-10-CM | POA: Diagnosis not present

## 2020-06-09 DIAGNOSIS — I2510 Atherosclerotic heart disease of native coronary artery without angina pectoris: Secondary | ICD-10-CM | POA: Diagnosis not present

## 2020-06-09 DIAGNOSIS — R42 Dizziness and giddiness: Secondary | ICD-10-CM | POA: Diagnosis not present

## 2020-06-09 DIAGNOSIS — R0600 Dyspnea, unspecified: Secondary | ICD-10-CM | POA: Diagnosis not present

## 2020-08-01 ENCOUNTER — Ambulatory Visit: Payer: Medicare Other | Admitting: Neurology

## 2020-08-01 ENCOUNTER — Encounter: Payer: Self-pay | Admitting: Neurology

## 2020-08-01 VITALS — Ht 69.0 in

## 2020-08-01 DIAGNOSIS — I951 Orthostatic hypotension: Secondary | ICD-10-CM

## 2020-08-01 DIAGNOSIS — R413 Other amnesia: Secondary | ICD-10-CM | POA: Diagnosis not present

## 2020-08-01 DIAGNOSIS — G4719 Other hypersomnia: Secondary | ICD-10-CM | POA: Diagnosis not present

## 2020-08-01 DIAGNOSIS — R42 Dizziness and giddiness: Secondary | ICD-10-CM

## 2020-08-01 MED ORDER — PYRIDOSTIGMINE BROMIDE 60 MG PO TABS: ORAL_TABLET | ORAL | 5 refills | Status: DC

## 2020-08-01 NOTE — Patient Instructions (Signed)
MESTINON (PYRIDOSTIGMINE)  Take 1/2 pill three times a day.     If this helps enough, continue on this dose.   After a week, if not better, then increase to one pill three times a day.

## 2020-08-01 NOTE — Progress Notes (Signed)
[facility]  PATIENT: Scott Guzman DOB: 11/09/1940  REFERRING DOCTOR OR PCP:, Kelli Churn MD, [name], MD SOURCE: Patient, notes from primary care, [place] cognitive assessment, laboratory results, MRI of the brain report personally reviewed.  _________________________________   HISTORICAL  CHIEF COMPLAINT:  Chief Complaint  Patient presents with  . New Patient (Initial Visit)    RM 41 w/ wife Paper referral from Golden Pop, MD for dizziness, weakness, family hx Alzheimers. If he stands too quickly, he gets dizzy. Orthorstatic vitals in epic. PT denies any memory issues, wife states he has memory problems. [place] today: 14/30 (14 yr of education)    HISTORY OF PRESENT ILLNESS:  I had the pleasure of seeing your patient, Scott Guzman, at [facility] for neurologic consultation regarding his dizziness and memory loss.  He is a 80 year old man with orthostatic dizziness for the past several years she also has been noted to have memory loss over the last 2 to 3 years.      He began to note that he would experience dizziness if he gets up rapidly.   This is most likely to happen if he is on the ground (I.e. pulling weeds) and gets up quickly.   The sensation is spinning and he does not note lightheadedness.   He has never had syncope.   He has not fallen during an episode  He has atrial fibrillation and was on amiodarone.   Due to the dizziness, the medication was d/c a few months ago but symptoms have persisted    He is on Xarelto.   The only other medication that he currently takes is mirtazapine for insomnia.      He was noted to have difficulty with memory in 2018 and was referred to [name].  He had a reduced score on a memory test and had an MRI of the brain performed that showed atrophy with a concern for normal pressure hydrocephalus.  He does not feel that he has much difficulty with memory and his wife feels he has been stable over  the last 2 years.Marland Kitchen  His wife notes he has more trouble if tired or later in the day.   He does have daytime sleepiness.  He snores but has not been noted to have gasping or pauses in breathing.    He has nightmares where he yells out but only once has had some kicking or punching.    These occur about twice a week.     I personally reviewed the MRI of the brain images from 05/21/2017 (with a NeuroQuant study).  He has moderate generalized atrophy and chronic microvascular ischemic change. NPH was considered.  He had no difficulty with gait or bladder.   He saw Dr. 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       He is very active but has become less active over the past couple years.   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    800 meters in 2 minutes and 10 sec 400 meters in 1 minute 4 sec  Orthostatic VS for the past 72 hrs (Last 3 readings):  Orthostatic BP Patient Position BP Location Cuff Size Orthostatic Pulse  08/01/20 1118 100/58 Standing Left Arm Normal 74  08/01/20 1116 134/82 Sitting Left Arm Normal 65  08/01/20 1109 164/84  Supine Left Arm Normal 57    [place] Cognitive Assessment  08/01/2020  Visuospatial/ Executive (0/5) 1  Naming (0/3) 3  Attention: Read list of digits (0/2) 2  Attention: Read list of letters (0/1) 1  Attention: Serial 7 subtraction starting at 100 (0/3) 2  Language: Repeat phrase (0/2) 0  Language : Fluency (0/1) 0  Abstraction (0/2) 1  Delayed Recall (0/5) 0  Orientation (0/6) 4  Total 14  Adjusted Score (based on education) 14    EPWORTH SLEEPINESS SCALE  On a scale of 0 - 3 what is the chance of dozing:  Sitting and Reading:   2 Watching TV:    3 Sitting inactive in a public place: 1 Passenger in car for one hour: 1 Lying down to rest in the afternoon: 3 Sitting and talking to someone: 0 Sitting quietly after lunch:  2 In a car,  stopped in traffic:  0  Total (out of 24): 12/24 mild EDS   REVIEW OF SYSTEMS: Constitutional: No fevers, chills, sweats, or change in appetite Eyes: No visual changes, double vision, eye pain Ear, nose and throat: No hearing loss, ear pain, nasal congestion, sore throat Cardiovascular: No chest pain, palpitations Respiratory: No shortness of breath at rest or with exertion.   No wheezes GastrointestinaI: No nausea, vomiting, diarrhea, abdominal pain, fecal incontinence Genitourinary: No dysuria, urinary retention or frequency.  No nocturia. Musculoskeletal: No neck pain, back pain Integumentary: No rash, pruritus, skin lesions Neurological: as above Psychiatric: No depression at this time.  No anxiety Endocrine: No palpitations, diaphoresis, change in appetite, change in weigh or increased thirst Hematologic/Lymphatic: No anemia, purpura, petechiae. Allergic/Immunologic: No itchy/runny eyes, nasal congestion, recent allergic reactions, rashes  ALLERGIES: No Known Allergies  HOME MEDICATIONS:  Current Outpatient Medications:  .  mirtazapine (REMERON) 30 MG tablet, Take 1 tablet (30 mg total) by mouth at bedtime. Please schedule office visit before any future refills., Disp: 90 tablet, Rfl: 4 .  pyridostigmine (MESTINON) 60 MG tablet, 1/2 - 1 po tid, Disp: 90 tablet, Rfl: 5 .  Rivaroxaban (XARELTO) 15 MG TABS tablet, Take 15 mg by mouth daily with supper., Disp: , Rfl:  No current facility-administered medications for this visit.  Facility-Administered Medications Ordered in Other Visits:  .  sodium chloride flush (NS) 0.9 % injection 3 mL, 3 mL, Intravenous, Q12H, [name], MD  PAST MEDICAL HISTORY: Past Medical History:  Diagnosis Date  . Colon cancer ([place]) 2000   He states he had a partial colon resection.   . Coronary artery disease   . Dysphagia   . Dyspnea   . Dysrhythmia    Atrial Fibrillation  . Hyperlipidemia   . Hypertension   . PVC (premature  ventricular contraction)   . Temporal arteritis ([place])     PAST SURGICAL HISTORY: Past Surgical History:  Procedure Laterality Date  . BACK SURGERY     Kyphoplasty T12  (Nov. 2018)  . COLON SURGERY    . COLONOSCOPY WITH PROPOFOL N/A 05/05/2017   Procedure: COLONOSCOPY WITH PROPOFOL;  Surgeon: Manya Silvas, MD;  Location: [facility] ENDOSCOPY;  Service: Endoscopy;  Laterality: N/A;  . KYPHOPLASTY N/A 01/23/2017   Procedure: [id number];  Surgeon: [name], MD;  Location: [facility] ORS;  Service: Orthopedics;  Laterality: N/A;  . LEFT HEART CATH AND CORONARY ANGIOGRAPHY Right 12/03/2018   Procedure: LEFT HEART CATH AND CORONARY ANGIOGRAPHY;  Surgeon: [name], MD;  Location: [facility];  Service: Cardiovascular;  Laterality: Right;  FAMILY HISTORY: Family History  Problem Relation Age of Onset  . Colon cancer Brother     SOCIAL HISTORY:  Social History   Socioeconomic History  . Marital status: Married    Spouse name: [name]   . Number of children: 2  . Years of education: Not on file  . Highest education level: Bachelor's degree (e.g., BA, AB, BS)  Occupational History  . Occupation: retired    Comment: Charity fundraiser  . 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 Smoking status: Never Smoker  . Smokeless tobacco: Never Used  Vaping Use  . Vaping Use: Never used  Substance and Sexual Activity  . Alcohol use: Yes    Alcohol/week: 4.0 standard drinks    Types: 4 Cans of beer per week  . Drug use: No  . Sexual activity: Not on file  Other Topics Concern  . Not on file  Social History Narrative   1 cup coffee per day   Right handed   Social Determinants of Health   Financial Resource Strain: Not on file  Food Insecurity: Not on file  Transportation Needs: Not on file  Physical Activity: Not on file  Stress: Not on file  Social Connections: Not on file  Intimate Partner Violence: Not on file      PHYSICAL EXAM  Vitals:   08/01/20 1109 08/01/20 1116 08/01/20 1118  SpO2: 98% 98% 98%  Height: 5\' 9"  (1.753 m)      Body mass index is 21.41 kg/m.  Orthostatic VS for the past 72 hrs (Last 3 readings):  Orthostatic BP Patient Position BP Location Cuff Size Orthostatic Pulse  08/01/20 1118 100/58 Standing Left Arm Normal 74  08/01/20 1116 134/82 Sitting Left Arm Normal 65  08/01/20 1109 164/84 Supine Left Arm Normal 57     General: The patient is well-developed and well-nourished and in no acute distress.   Pharynx is Mallampati 2  HEENT:  Head is [place]/AT.  Sclera are anicteric.  Funduscopic exam shows normal optic discs and retinal vessels.  Neck: No carotid bruits are noted.  The neck is nontender.  Cardiovascular: The heart has a regular rate and rhythm with a normal S1 and S2. There were no murmurs, gallops or rubs.    Skin: Extremities are without rash or  edema.  Musculoskeletal:  Back is nontender  Neurologic Exam  Mental status: The patient is alert and oriented x 2 at the time of the examination.  He had reduced short-term memory, executive function and focus/attention.  His score was 14/30 on the [facility] cognitive assessment (mild dementia).   Speech is normal.  Cranial nerves: Extraocular movements are full. Pupils are equal, round, and reactive to light and accomodation.  Visual fields are full.  Facial symmetry is present. There is good facial sensation to soft touch bilaterally.Facial strength is normal.  Trapezius and sternocleidomastoid strength is normal. No dysarthria is noted.  The tongue is midline, and the patient has symmetric elevation of the soft palate. No obvious hearing deficits are noted.  Motor:  Muscle bulk is normal.   Tone is normal. Strength is  5 / 5 in all 4 extremities.   Sensory: Sensory testing is intact to pinprick, soft touch and vibration sensation in all 4 extremities.  Coordination: Cerebellar testing reveals good  finger-nose-finger and heel-to-shin bilaterally.  Gait and station: Station is normal.   Gait is probably normal for age with a mildly reduced stride.  Tandem gait is  mildly wide. Romberg is negative.   Reflexes: Deep tendon reflexes are symmetric and normal bilaterally.   Plantar responses are flexor.    DIAGNOSTIC DATA (LABS, IMAGING, TESTING) - I reviewed patient records, labs, notes, testing and imaging myself where available.  Lab Results  Component Value Date   WBC 6.5 05/24/2020   HGB 12.2 (L) 05/24/2020   HCT 37.4 (L) 05/24/2020   MCV 92.6 05/24/2020   PLT 170 05/24/2020      Component Value Date/Time   NA 140 05/24/2020 1726   NA 139 03/06/2020 1107   NA 143 10/20/2013 0150   K 4.9 05/24/2020 1726   K 4.2 10/20/2013 0150   CL 105 05/24/2020 1726   CL 106 10/20/2013 0150   CO2 26 05/24/2020 1726   CO2 30 10/20/2013 0150   GLUCOSE 109 (H) 05/24/2020 1726   GLUCOSE 103 (H) 10/20/2013 0150   BUN 33 (H) 05/24/2020 1726   BUN 26 03/06/2020 1107   BUN 26 (H) 10/20/2013 0150   CREATININE 2.26 (H) 05/24/2020 1726   CREATININE 1.83 (H) 10/20/2013 0150   CALCIUM 9.1 05/24/2020 1726   CALCIUM 8.4 (L) 10/20/2013 0150   PROT 6.8 05/24/2020 1726   PROT 6.2 01/12/2018 1234   PROT 6.8 10/20/2013 0150   ALBUMIN 4.2 05/24/2020 1726   ALBUMIN 4.0 01/12/2018 1234   ALBUMIN 3.6 10/20/2013 0150   AST 28 05/24/2020 1726   AST 27 10/20/2013 0150   ALT 20 05/24/2020 1726   ALT 23 10/20/2013 0150   ALKPHOS 50 05/24/2020 1726   ALKPHOS 57 10/20/2013 0150   BILITOT 0.7 05/24/2020 1726   BILITOT 0.4 01/12/2018 1234   BILITOT 0.8 10/20/2013 0150   GFRNONAA 29 (L) 05/24/2020 1726   GFRNONAA 37 (L) 10/20/2013 0150   GFRAA 50 (L) 03/06/2020 1107   GFRAA 42 (L) 10/20/2013 0150   Lab Results  Component Value Date   CHOL 140 03/06/2020   HDL 74 03/06/2020   LDLCALC 55 03/06/2020   TRIG 50 03/06/2020    Lab Results  Component Value Date   TSH 7.599 (H) 05/24/2020        ASSESSMENT AND PLAN  Memory loss - Plan: Vitamin B12, Thyroid Panel With TSH  Orthostatic hypotension  Vertigo  Excessive daytime sleepiness   In summary, Mr. Mannarino is a 80 year old man with orthostatic hypotension associated with vertigo upon standing up.  Because this is symptomatic on a daily basis, we will go ahead and initiate treatment with Mestinon 30 mg p.o. 3 times daily and titrate up to 60 mg p.o. 3 times daily based on result and tolerance.  If this does not help after a few weeks he should give [place] a call and then I will switch him over to fludrocortisone 0.1 mg every morning.  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  He scored 14/30 on the [facility] cognitive assessment today (this would correspond to a score of about 20 on the MMSE).  We will check B12 and thyroid function.  Most likely, this is related to his abnormal brain MRI that showed atrophy and moderate chronic microvascular ischemic change.  I do not think he has normal pressure hydrocephalus based on his exam.  We also discussed that poor sleep can be associated with worse cognitive performance.  He snores just a little bit but does have excessive daytime sleepiness.  His wife has not noted any OSA signs but I have asked her to pay more attention to his breathing while she is  awake.  We will hold off on testing a sleep study at this time but reconsider if symptoms worsen.  Additionally I would consider starting Aricept.  However, since he is more troubled by the orthostatic dizziness, and Aricept is unlikely to provide much benefit, we will hold off at this time.  He will return to see me in about 6 months or sooner if there are new or worsening neurologic symptoms.  Thank you for asking me to see [name].  Please let me know if I can be of further assistance with him or other patients in the future.  Gunther Zawadzki A. Felecia Shelling, MD, [facility] 1/51/7616, 0:73 PM Certified in Neurology, Clinical Neurophysiology, Sleep Medicine  and Neuroimaging  [facility] [address] [place], [place] [address] [phone]

## 2020-08-02 LAB — VITAMIN B12: Vitamin B-12: 690 pg/mL (ref 232–1245)

## 2020-08-02 LAB — THYROID PANEL WITH TSH
Free Thyroxine Index: 3.9 (ref 1.2–4.9)
T3 Uptake Ratio: 31 % (ref 24–39)
T4, Total: 12.6 ug/dL — ABNORMAL HIGH (ref 4.5–12.0)
TSH: 1.09 u[IU]/mL (ref 0.450–4.500)

## 2020-08-31 DIAGNOSIS — I4891 Unspecified atrial fibrillation: Secondary | ICD-10-CM | POA: Diagnosis not present

## 2020-08-31 DIAGNOSIS — J209 Acute bronchitis, unspecified: Secondary | ICD-10-CM | POA: Diagnosis not present

## 2020-08-31 DIAGNOSIS — R062 Wheezing: Secondary | ICD-10-CM | POA: Diagnosis not present

## 2020-08-31 DIAGNOSIS — R911 Solitary pulmonary nodule: Secondary | ICD-10-CM | POA: Diagnosis not present

## 2020-08-31 DIAGNOSIS — N1832 Chronic kidney disease, stage 3b: Secondary | ICD-10-CM | POA: Diagnosis not present

## 2020-08-31 DIAGNOSIS — R0600 Dyspnea, unspecified: Secondary | ICD-10-CM | POA: Diagnosis not present

## 2020-08-31 DIAGNOSIS — R634 Abnormal weight loss: Secondary | ICD-10-CM | POA: Diagnosis not present

## 2020-09-05 ENCOUNTER — Other Ambulatory Visit: Payer: Self-pay

## 2020-09-05 ENCOUNTER — Encounter: Payer: Self-pay | Admitting: Internal Medicine

## 2020-09-05 ENCOUNTER — Ambulatory Visit (INDEPENDENT_AMBULATORY_CARE_PROVIDER_SITE_OTHER): Payer: Medicare Other | Admitting: Internal Medicine

## 2020-09-05 VITALS — BP 116/73 | HR 90 | Temp 98.1°F | Ht 68.39 in | Wt 127.0 lb

## 2020-09-05 DIAGNOSIS — R7989 Other specified abnormal findings of blood chemistry: Secondary | ICD-10-CM | POA: Diagnosis not present

## 2020-09-05 DIAGNOSIS — R634 Abnormal weight loss: Secondary | ICD-10-CM

## 2020-09-05 MED ORDER — ALBUTEROL SULFATE HFA 108 (90 BASE) MCG/ACT IN AERS: 2.0000 | INHALATION_SPRAY | Freq: Four times a day (QID) | RESPIRATORY_TRACT | 2 refills | Status: DC | PRN

## 2020-09-05 MED ORDER — MIRTAZAPINE 30 MG PO TABS: 30.0000 mg | ORAL_TABLET | Freq: Every day | ORAL | 4 refills | Status: DC

## 2020-09-05 MED ORDER — METHIMAZOLE 5 MG PO TABS: 5.0000 mg | ORAL_TABLET | Freq: Every day | ORAL | 1 refills | Status: DC

## 2020-09-05 NOTE — Progress Notes (Signed)
C  BP 116/73   Pulse 90   Temp 98.1 F (36.7 C) (Oral)   Ht 5' 8.39" (1.737 m)   Wt 127 lb (57.6 kg)   SpO2 98%   BMI 19.09 kg/m    Subjective:    Patient ID: Scott Guzman, male    DOB: [birth year], 80 y.o.   MRN: [id number]  Chief Complaint  Patient presents with   Wheezing    All for the past 2 months.    Fatigue        Allergic Rhinitis    Cough   Anemia   Weight Loss   Leg numbness B/L    HPI: Scott Guzman is a 80 y.o. male  Pt is here for a follow up has had weight loss was given doxycycline and prednisone Pt co weakness now. 15 lbs weight loss - x 6 months, when he turned 80 yrs old was found to have such. Dr. Tollie Pizza   Wheezing  This is a new (had CXR no pna found was @ Urgent care for such.) problem. Associated symptoms include coughing.  Cough Associated symptoms include wheezing.  Anemia   Chief Complaint  Patient presents with   Wheezing    All for the past 2 months.    Fatigue        Allergic Rhinitis    Cough   Anemia   Weight Loss   Leg numbness B/L    Relevant past medical, surgical, family and social history reviewed and updated as indicated. Interim medical history since our last visit reviewed. Allergies and medications reviewed and updated.  Review of Systems  Respiratory:  Positive for cough and wheezing.    Per HPI unless specifically indicated above     Objective:    BP 116/73   Pulse 90   Temp 98.1 F (36.7 C) (Oral)   Ht 5' 8.39" (1.737 m)   Wt 127 lb (57.6 kg)   SpO2 98%   BMI 19.09 kg/m   Wt Readings from Last 3 Encounters:  09/05/20 127 lb (57.6 kg)  05/24/20 145 lb (65.8 kg)  03/06/20 134 lb (60.8 kg)    Physical Exam  Results for orders placed or performed in visit on 08/01/20  Vitamin B12  Result Value Ref Range   Vitamin B-12 690 232 - 1,245 pg/mL  Thyroid Panel With TSH  Result Value Ref Range   TSH 1.090 0.450 - 4.500 uIU/mL   T4, Total 12.6 (H) 4.5 - 12.0 ug/dL   T3 Uptake Ratio 31 24 - 39 %    Free Thyroxine Index 3.9 1.2 - 4.9        Current Outpatient Medications:    clopidogrel (PLAVIX) 75 MG tablet, Take by mouth., Disp: , Rfl:    doxycycline (VIBRAMYCIN) 100 MG capsule, Take 100 mg by mouth 2 (two) times daily., Disp: , Rfl:    mirtazapine (REMERON) 30 MG tablet, Take 1 tablet (30 mg total) by mouth at bedtime. Please schedule office visit before any future refills., Disp: 90 tablet, Rfl: 4   polyethylene glycol powder (GLYCOLAX/MIRALAX) 17 GM/SCOOP powder, Take by mouth., Disp: , Rfl:    predniSONE (DELTASONE) 20 MG tablet, Take 20 mg by mouth 2 (two) times daily., Disp: , Rfl:    pyridostigmine (MESTINON) 60 MG tablet, 1/2 - 1 po tid, Disp: 90 tablet, Rfl: 5   Rivaroxaban (XARELTO) 15 MG TABS tablet, Take 15 mg by mouth daily with supper., Disp: , Rfl:    rosuvastatin (  CRESTOR) 20 MG tablet, Take 20 mg by mouth at bedtime., Disp: , Rfl:  No current facility-administered medications for this visit.  Facility-Administered Medications Ordered in Other Visits:    sodium chloride flush (NS) 0.9 % injection 3 mL, 3 mL, Intravenous, Q12H, [name], MD    Assessment & Plan:  Hyperthyroidism :  Recheck levels.  Refer to endocrine Might need US thyroid ?  hyperthyroidism   Ref. Range 05/24/2020 22:00 08/01/2020 12:23  TSH Latest Ref Range: 0.450 - 4.500 uIU/mL  1.090  T4,Free(Direct) Latest Ref Range: 0.61 - 1.12 ng/dL 1.21 (H)   Thyroxine (T4) Latest Ref Range: 4.5 - 12.0 ug/dL  12.6 (H)  Free Thyroxine Index Latest Ref Range: 1.2 - 4.9   3.9  T3 Uptake Ratio Latest Ref Range: 24 - 39 %  31   Weight loss of 15 lbs.  Will check above for hyperthyroidism Consider methimazole.  Consider screen for malignancy if continues to loose weight Follow up with me x 3 weeks.  Is on remeron for low appetite continues Continue ensure Consider pan Ct including CT chest/ abdomen / pelvis PSA not checked since 2017 per Epic>will check   Problem List Items Addressed This Visit    None    Follow up plan: No follow-ups on file.

## 2020-09-06 ENCOUNTER — Ambulatory Visit: Admission: RE | Admit: 2020-09-06 | Payer: Medicare Other | Source: Ambulatory Visit

## 2020-09-06 ENCOUNTER — Other Ambulatory Visit: Payer: Self-pay | Admitting: Internal Medicine

## 2020-09-06 ENCOUNTER — Telehealth: Payer: Self-pay

## 2020-09-06 ENCOUNTER — Ambulatory Visit
Admission: RE | Admit: 2020-09-06 | Discharge: 2020-09-06 | Disposition: A | Discharge status: Home / Self Care | Payer: Medicare Other | Source: Ambulatory Visit | Attending: Internal Medicine | Admitting: Internal Medicine

## 2020-09-06 ENCOUNTER — Telehealth: Payer: Self-pay | Admitting: Internal Medicine

## 2020-09-06 DIAGNOSIS — M4854XA Collapsed vertebra, not elsewhere classified, thoracic region, initial encounter for fracture: Secondary | ICD-10-CM | POA: Diagnosis not present

## 2020-09-06 DIAGNOSIS — R634 Abnormal weight loss: Secondary | ICD-10-CM | POA: Insufficient documentation

## 2020-09-06 DIAGNOSIS — D7389 Other diseases of spleen: Secondary | ICD-10-CM | POA: Diagnosis not present

## 2020-09-06 DIAGNOSIS — I700 Atherosclerosis of aorta: Secondary | ICD-10-CM | POA: Insufficient documentation

## 2020-09-06 DIAGNOSIS — N200 Calculus of kidney: Secondary | ICD-10-CM | POA: Diagnosis not present

## 2020-09-06 DIAGNOSIS — I2510 Atherosclerotic heart disease of native coronary artery without angina pectoris: Secondary | ICD-10-CM | POA: Diagnosis not present

## 2020-09-06 DIAGNOSIS — J9859 Other diseases of mediastinum, not elsewhere classified: Secondary | ICD-10-CM | POA: Diagnosis not present

## 2020-09-06 LAB — COMPREHENSIVE METABOLIC PANEL
ALT: 21 IU/L (ref 0–44)
AST: 16 IU/L (ref 0–40)
Albumin/Globulin Ratio: 2.1 (ref 1.2–2.2)
Albumin: 3.9 g/dL (ref 3.7–4.7)
Alkaline Phosphatase: 79 IU/L (ref 44–121)
BUN/Creatinine Ratio: 30 — ABNORMAL HIGH (ref 10–24)
BUN: 39 mg/dL — ABNORMAL HIGH (ref 8–27)
Bilirubin Total: 0.3 mg/dL (ref 0.0–1.2)
CO2: 21 mmol/L (ref 20–29)
Calcium: 9.4 mg/dL (ref 8.6–10.2)
Chloride: 105 mmol/L (ref 96–106)
Creatinine, Ser: 1.28 mg/dL — ABNORMAL HIGH (ref 0.76–1.27)
Globulin, Total: 1.9 g/dL (ref 1.5–4.5)
Glucose: 96 mg/dL (ref 65–99)
Potassium: 4.8 mmol/L (ref 3.5–5.2)
Sodium: 143 mmol/L (ref 134–144)
Total Protein: 5.8 g/dL — ABNORMAL LOW (ref 6.0–8.5)
eGFR: 58 mL/min/{1.73_m2} — ABNORMAL LOW (ref >59)

## 2020-09-06 LAB — THYROID PANEL WITH TSH
Free Thyroxine Index: 6.1 — ABNORMAL HIGH (ref 1.2–4.9)
T3 Uptake Ratio: 42 % — ABNORMAL HIGH (ref 24–39)
T4, Total: 14.5 ug/dL — ABNORMAL HIGH (ref 4.5–12.0)
TSH: <0.005 u[IU]/mL — ABNORMAL LOW (ref 0.450–4.500)

## 2020-09-06 NOTE — Telephone Encounter (Signed)
Copied from [place] [id number]. Topic: Referral - Request for Referral >> Sep 06, 2020  1:37 PM Erick Blinks wrote: Has patient seen PCP for this complaint? No. *If NO, is insurance requiring patient see PCP for this issue before PCP can refer them? Referral for which specialty: Endocrinology  Preferred provider/office:  Needs new referral, [place] cannot see him until September. The providers will be out of the office, [place] from [facility] wants to know if PCP would like to adjust his referral.  Reason for referral:   Best contact: [phone]   MJ

## 2020-09-06 NOTE — Telephone Encounter (Signed)
D/w pt and wife to have a CT abdomen/ pelvis / chest to evaluate for weight loss.

## 2020-09-06 NOTE — Telephone Encounter (Signed)
Called Patient and informed him that all he has to do is to go to [facility] for his CT today, they will work him in. Patient verbalized understanding

## 2020-09-07 LAB — PSA: Prostate Specific Ag, Serum: 0.5 ng/mL (ref 0.0–4.0)

## 2020-09-07 LAB — T4, FREE: Free T4: 2.96 ng/dL — ABNORMAL HIGH (ref 0.82–1.77)

## 2020-09-07 LAB — SPECIMEN STATUS REPORT

## 2020-09-11 DIAGNOSIS — R55 Syncope and collapse: Secondary | ICD-10-CM | POA: Diagnosis not present

## 2020-09-11 DIAGNOSIS — I2510 Atherosclerotic heart disease of native coronary artery without angina pectoris: Secondary | ICD-10-CM | POA: Diagnosis not present

## 2020-09-11 DIAGNOSIS — I4891 Unspecified atrial fibrillation: Secondary | ICD-10-CM | POA: Diagnosis not present

## 2020-09-11 DIAGNOSIS — I10 Essential (primary) hypertension: Secondary | ICD-10-CM | POA: Diagnosis not present

## 2020-09-11 DIAGNOSIS — R0602 Shortness of breath: Secondary | ICD-10-CM | POA: Diagnosis not present

## 2020-09-13 ENCOUNTER — Other Ambulatory Visit: Payer: Self-pay

## 2020-09-13 ENCOUNTER — Ambulatory Visit (INDEPENDENT_AMBULATORY_CARE_PROVIDER_SITE_OTHER): Payer: Medicare Other | Admitting: Internal Medicine

## 2020-09-13 ENCOUNTER — Ambulatory Visit: Payer: Self-pay

## 2020-09-13 ENCOUNTER — Encounter: Payer: Self-pay | Admitting: Internal Medicine

## 2020-09-13 VITALS — BP 101/66 | HR 79 | Temp 98.3°F | Ht 68.39 in | Wt 128.8 lb

## 2020-09-13 DIAGNOSIS — K5900 Constipation, unspecified: Secondary | ICD-10-CM

## 2020-09-13 MED ORDER — DOCUSATE SODIUM 100 MG PO CAPS: 100.0000 mg | ORAL_CAPSULE | Freq: Two times a day (BID) | ORAL | 0 refills | Status: AC

## 2020-09-13 NOTE — Patient Instructions (Signed)

## 2020-09-13 NOTE — Telephone Encounter (Signed)
Pt. Has been constipated x 4 days. No abdominal pain. Does feel bloated. Feels he may have stool in rectum. Has tried prunes, Dulcolax and Miralax with no results. Appointment made.

## 2020-09-13 NOTE — Progress Notes (Signed)
BP 101/66   Pulse 79   Temp 98.3 F (36.8 C) (Oral)   Ht 5' 8.39" (1.737 m)   Wt 128 lb 12.8 oz (58.4 kg)   SpO2 99%   BMI 19.36 kg/m    Subjective:    Patient ID: Scott Guzman, male    DOB: 04/23/1940, 80 y.o.   MRN: [id number]  Chief Complaint  Patient presents with  . Constipation    For past 4 days, has been taking Miralax and drinking prune juice.     HPI: Scott Guzman is a 80 y.o. male  Had antiotics x 5 days and had diarrhea x  4 days and then has had constipation since then, then now is constipated. [place]ook prune juice, miralax dulcolax To see endocrinology tomorrow for hyperthyrodiism    Constipation This is a new (last bowel movt at least 5 days ago) problem. The problem has been gradually worsening since onset. Pertinent negatives include no abdominal pain, anorexia, back pain, bloating, diarrhea, difficulty urinating, fecal incontinence, fever, flatus, hematochezia, hemorrhoids, melena, nausea, rectal pain, vomiting or weight loss.   Chief Complaint  Patient presents with  . Constipation    For past 4 days, has been taking Miralax and drinking prune juice.     Relevant past medical, surgical, family and social history reviewed and updated as indicated. Interim medical history since our last visit reviewed. Allergies and medications reviewed and updated.  Review of Systems  Constitutional:  Negative for fever and weight loss.  Gastrointestinal:  Positive for constipation. Negative for abdominal pain, anorexia, bloating, diarrhea, flatus, hematochezia, hemorrhoids, melena, nausea, rectal pain and vomiting.  Genitourinary:  Negative for difficulty urinating.  Musculoskeletal:  Negative for back pain.   Per HPI unless specifically indicated above     Objective:    BP 101/66   Pulse 79   Temp 98.3 F (36.8 C) (Oral)   Ht 5' 8.39" (1.737 m)   Wt 128 lb 12.8 oz (58.4 kg)   SpO2 99%   BMI 19.36 kg/m   Wt Readings from Last 3 Encounters:  09/13/20 128 lb  12.8 oz (58.4 kg)  09/05/20 127 lb (57.6 kg)  05/24/20 145 lb (65.8 kg)    Physical Exam Vitals and nursing note reviewed.  Constitutional:      General: He is not in acute distress.    Appearance: Normal appearance. He is not ill-appearing or diaphoretic.  HENT:     Head: Normocephalic and atraumatic.     Right Ear: Tympanic membrane and external ear normal. There is no impacted cerumen.     Left Ear: External ear normal.     Nose: No congestion or rhinorrhea.     Mouth/Throat:     Pharynx: No oropharyngeal exudate or posterior oropharyngeal erythema.  Eyes:     Conjunctiva/sclera: Conjunctivae normal.     Pupils: Pupils are equal, round, and reactive to light.  Cardiovascular:     Rate and Rhythm: Normal rate and regular rhythm.     Heart sounds: No murmur heard.   No friction rub. No gallop.  Pulmonary:     Effort: No respiratory distress.     Breath sounds: No stridor. No wheezing or rhonchi.  Chest:     Chest wall: No tenderness.  Abdominal:     General: Abdomen is flat. Bowel sounds are normal.     Palpations: Abdomen is soft. There is no mass.     Tenderness: There is no abdominal tenderness.  Musculoskeletal:  Cervical back: Normal range of motion and neck supple. No rigidity or tenderness.     Left lower leg: No edema.  Neurological:     Mental Status: He is alert.   Results for orders placed or performed in visit on 09/05/20  Comprehensive metabolic panel  Result Value Ref Range   Glucose 96 65 - 99 mg/dL   BUN 39 (H) 8 - 27 mg/dL   Creatinine, Ser 1.28 (H) 0.76 - 1.27 mg/dL   eGFR 58 (L) >59 mL/min/1.73   BUN/Creatinine Ratio 30 (H) 10 - 24   Sodium 143 134 - 144 mmol/L   Potassium 4.8 3.5 - 5.2 mmol/L   Chloride 105 96 - 106 mmol/L   CO2 21 20 - 29 mmol/L   Calcium 9.4 8.6 - 10.2 mg/dL   Total Protein 5.8 (L) 6.0 - 8.5 g/dL   Albumin 3.9 3.7 - 4.7 g/dL   Globulin, Total 1.9 1.5 - 4.5 g/dL   Albumin/Globulin Ratio 2.1 1.2 - 2.2   Bilirubin Total  0.3 0.0 - 1.2 mg/dL   Alkaline Phosphatase 79 44 - 121 IU/L   AST 16 0 - 40 IU/L   ALT 21 0 - 44 IU/L  Thyroid Panel With TSH  Result Value Ref Range   TSH <0.005 (L) 0.450 - 4.500 uIU/mL   T4, Total 14.5 (H) 4.5 - 12.0 ug/dL   T3 Uptake Ratio 42 (H) 24 - 39 %   Free Thyroxine Index 6.1 (H) 1.2 - 4.9  PSA  Result Value Ref Range   Prostate Specific Ag, Serum 0.5 0.0 - 4.0 ng/mL  T4, free  Result Value Ref Range   Free T4 2.96 (H) 0.82 - 1.77 ng/dL  Specimen status report  Result Value Ref Range   specimen status report Comment         Current Outpatient Medications:  .  albuterol (VENTOLIN HFA) 108 (90 Base) MCG/ACT inhaler, Inhale 2 puffs into the lungs every 6 (six) hours as needed for wheezing or shortness of breath., Disp: 8 g, Rfl: 2 .  clopidogrel (PLAVIX) 75 MG tablet, Take by mouth., Disp: , Rfl:  .  methimazole (TAPAZOLE) 5 MG tablet, Take 5 mg by mouth daily., Disp: , Rfl:  .  mirtazapine (REMERON) 30 MG tablet, Take 1 tablet (30 mg total) by mouth at bedtime. Please schedule office visit before any future refills., Disp: 90 tablet, Rfl: 4 .  polyethylene glycol powder (GLYCOLAX/MIRALAX) 17 GM/SCOOP powder, Take by mouth., Disp: , Rfl:  .  pyridostigmine (MESTINON) 60 MG tablet, 1/2 - 1 po tid, Disp: 90 tablet, Rfl: 5 .  Rivaroxaban (XARELTO) 15 MG TABS tablet, Take 15 mg by mouth daily with supper., Disp: , Rfl:  .  rosuvastatin (CRESTOR) 20 MG tablet, Take 20 mg by mouth at bedtime., Disp: , Rfl:  .  doxycycline (VIBRAMYCIN) 100 MG capsule, Take 100 mg by mouth 2 (two) times daily. (Patient not taking: Reported on 09/13/2020), Disp: , Rfl:  .  predniSONE (DELTASONE) 20 MG tablet, Take 20 mg by mouth 2 (two) times daily. (Patient not taking: Reported on 09/13/2020), Disp: , Rfl:  No current facility-administered medications for this visit.  Facility-Administered Medications Ordered in Other Visits:  .  sodium chloride flush (NS) 0.9 % injection 3 mL, 3 mL, Intravenous,  Q12H, [name], MD    Assessment & Plan:  Constipation x 1 week.  Start pt on colace Will need to increase water intake - to 64 - 72 oz  a day.   Problem List Items Addressed This Visit   None    No orders of the defined types were placed in this encounter.    Meds ordered this encounter  Medications  . docusate sodium (COLACE) 100 MG capsule    Sig: Take 1 capsule (100 mg total) by mouth 2 (two) times daily for 14 days.    Dispense:  28 capsule    Refill:  0     Follow up plan: No follow-ups on file.

## 2020-09-13 NOTE — Telephone Encounter (Signed)
Fyi pt is scheduled for an appointment today per Dr Neomia Dear

## 2020-09-13 NOTE — Telephone Encounter (Signed)
Reason for Disposition  [1] Rectal pain or fullness from fecal impaction (rectum full of stool) AND [2] NOT better after SITZ bath, suppository or enema  Answer Assessment - Initial Assessment Questions 1. STOOL PATTERN OR FREQUENCY: "How often do you have a bowel movement (BM)?"  (Normal range: 3 times a day to every 3 days)  "When was your last BM?"       Last BM 5 days ago 2. STRAINING: "Do you have to strain to have a BM?"      Yes 3. RECTAL PAIN: "Does your rectum hurt when the stool comes out?" If Yes, ask: "Do you have hemorrhoids? How bad is the pain?"  (Scale 1-10; or mild, moderate, severe)     No 4. STOOL COMPOSITION: "Are the stools hard?"      None 5. BLOOD ON STOOLS: "Has there been any blood on the toilet tissue or on the surface of the BM?" If Yes, ask: "When was the last time?"      No 6. CHRONIC CONSTIPATION: "Is this a new problem for you?"  If no, ask: "How long have you had this problem?" (days, weeks, months)      Yes 7. CHANGES IN DIET OR HYDRATION: "Have there been any recent changes in your diet?" "How much fluids are you drinking on a daily basis?"  "How much have you had to drink today?"     No 8. MEDICATIONS: "Have you been taking any new medications?" "Are you taking any narcotic pain medications?" (e.g., Vicodin, Percocet, morphine, Dilaudid)     No 9. LAXATIVES: "Have you been using any stool softeners, laxatives, or enemas?"  If yes, ask "What, how often, and when was the last time?"     Prunes,Dulcolax, Ensure 10. ACTIVITY:  "How much walking do you do every day?"  "Has your activity level decreased in the past week?"        Walks 11. CAUSE: "What do you think is causing the constipation?"        Unsure 12. OTHER SYMPTOMS: "Do you have any other symptoms?" (e.g., abdominal pain, bloating, fever, vomiting)       No pain, feels bloated 13. MEDICAL HISTORY: "Do you have a history of hemorrhoids, rectal fissures, or rectal surgery or rectal abscess?"         No 14. PREGNANCY: "Is there any chance you are pregnant?" "When was your last menstrual period?"       N/a  Protocols used: Constipation-A-AH

## 2020-09-14 DIAGNOSIS — M6281 Muscle weakness (generalized): Secondary | ICD-10-CM | POA: Diagnosis not present

## 2020-09-14 DIAGNOSIS — R634 Abnormal weight loss: Secondary | ICD-10-CM | POA: Diagnosis not present

## 2020-09-14 DIAGNOSIS — Z92241 Personal history of systemic steroid therapy: Secondary | ICD-10-CM | POA: Diagnosis not present

## 2020-09-14 DIAGNOSIS — E0590 Thyrotoxicosis, unspecified without thyrotoxic crisis or storm: Secondary | ICD-10-CM | POA: Diagnosis not present

## 2020-09-14 DIAGNOSIS — Z8349 Family history of other endocrine, nutritional and metabolic diseases: Secondary | ICD-10-CM | POA: Diagnosis not present

## 2020-09-14 DIAGNOSIS — R195 Other fecal abnormalities: Secondary | ICD-10-CM | POA: Diagnosis not present

## 2020-09-15 DIAGNOSIS — R7989 Other specified abnormal findings of blood chemistry: Secondary | ICD-10-CM | POA: Diagnosis not present

## 2020-09-15 DIAGNOSIS — M6281 Muscle weakness (generalized): Secondary | ICD-10-CM | POA: Diagnosis not present

## 2020-09-15 DIAGNOSIS — R195 Other fecal abnormalities: Secondary | ICD-10-CM | POA: Diagnosis not present

## 2020-09-15 DIAGNOSIS — E0590 Thyrotoxicosis, unspecified without thyrotoxic crisis or storm: Secondary | ICD-10-CM | POA: Diagnosis not present

## 2020-09-15 DIAGNOSIS — R634 Abnormal weight loss: Secondary | ICD-10-CM | POA: Diagnosis not present

## 2020-09-16 LAB — T4, FREE: Free T4: 2.28 ng/dL — ABNORMAL HIGH (ref 0.82–1.77)

## 2020-09-24 ENCOUNTER — Encounter: Payer: Self-pay | Admitting: Emergency Medicine

## 2020-09-24 ENCOUNTER — Emergency Department: Payer: Medicare Other

## 2020-09-24 ENCOUNTER — Emergency Department
Admission: EM | Admit: 2020-09-24 | Discharge: 2020-09-24 | Disposition: A | Discharge status: Home / Self Care | Payer: Medicare Other | Attending: Emergency Medicine | Admitting: Emergency Medicine

## 2020-09-24 ENCOUNTER — Other Ambulatory Visit: Payer: Self-pay

## 2020-09-24 DIAGNOSIS — Z85038 Personal history of other malignant neoplasm of large intestine: Secondary | ICD-10-CM | POA: Diagnosis not present

## 2020-09-24 DIAGNOSIS — R1030 Lower abdominal pain, unspecified: Secondary | ICD-10-CM | POA: Insufficient documentation

## 2020-09-24 DIAGNOSIS — I129 Hypertensive chronic kidney disease with stage 1 through stage 4 chronic kidney disease, or unspecified chronic kidney disease: Secondary | ICD-10-CM | POA: Diagnosis not present

## 2020-09-24 DIAGNOSIS — Z7901 Long term (current) use of anticoagulants: Secondary | ICD-10-CM | POA: Diagnosis not present

## 2020-09-24 DIAGNOSIS — I2510 Atherosclerotic heart disease of native coronary artery without angina pectoris: Secondary | ICD-10-CM | POA: Diagnosis not present

## 2020-09-24 DIAGNOSIS — K5900 Constipation, unspecified: Secondary | ICD-10-CM | POA: Diagnosis not present

## 2020-09-24 DIAGNOSIS — I4891 Unspecified atrial fibrillation: Secondary | ICD-10-CM | POA: Insufficient documentation

## 2020-09-24 DIAGNOSIS — Z79899 Other long term (current) drug therapy: Secondary | ICD-10-CM | POA: Diagnosis not present

## 2020-09-24 DIAGNOSIS — N183 Chronic kidney disease, stage 3 unspecified: Secondary | ICD-10-CM | POA: Insufficient documentation

## 2020-09-24 NOTE — ED Provider Notes (Signed)
[facility] Emergency Department Provider Note  ____________________________________________   Event Date/Time   First MD Initiated Contact with Patient 09/24/20 1614     (approximate)  I have reviewed the triage vital signs and the nursing notes.   HISTORY  Chief Complaint Constipation    HPI Scott Guzman is a 80 y.o. male presents emergency department stating he feels like he is constipated.  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  He also has an appoint with Dr. Bary Castilla on Friday for consult for colonoscopy.  He states he has been having very small bowel movements.  States he is eating.  No vomiting or diarrhea.  Past Medical History:  Diagnosis Date   Colon cancer ([place]) 2000   He states he had a partial colon resection.    Coronary artery disease    Dysphagia    Dyspnea    Dysrhythmia    Atrial Fibrillation   Hyperlipidemia    Hypertension    PVC (premature ventricular contraction)    Temporal arteritis [facility])     Patient Active Problem List   Diagnosis Date Noted   Memory loss 08/01/2020   Orthostatic hypotension 08/01/2020   Vertigo 08/01/2020   Excessive daytime sleepiness 08/01/2020   Other thrombophilia ([place]) 03/04/2020   Aortic atherosclerosis ([place]) 03/04/2020   CKD (chronic kidney disease) stage 3, GFR 30-59 ml/min ([name]) 03/03/2020   Cerebral ventriculomegaly 06/11/2017   Atrial fibrillation with controlled ventricular response ([place]) 06/03/2017   History of colon cancer 02/07/2017   History of compression fracture of spine 01/13/2017   Temporal arteritis ([place]) 03/12/2016   BPH (benign prostatic hyperplasia) 03/12/2016   Elevated erythrocyte sedimentation rate 01/29/2016   Insomnia 10/31/2014   Hypercholesterolemia 10/13/2014    Past Surgical History:  Procedure Laterality Date   BACK SURGERY     Kyphoplasty T12  (Nov. 2018)   COLON SURGERY     COLONOSCOPY WITH PROPOFOL N/A 05/05/2017   Procedure:  COLONOSCOPY WITH PROPOFOL;  Surgeon: Manya Silvas, MD;  Location: [facility] ENDOSCOPY;  Service: Endoscopy;  Laterality: N/A;   KYPHOPLASTY N/A 01/23/2017   Procedure: [id number];  Surgeon: [name], MD;  Location: [facility] ORS;  Service: Orthopedics;  Laterality: N/A;   LEFT HEART CATH AND CORONARY ANGIOGRAPHY Right 12/03/2018   Procedure: LEFT HEART CATH AND CORONARY ANGIOGRAPHY;  Surgeon: [name], MD;  Location: [facility] CV LAB;  Service: Cardiovascular;  Laterality: Right;    Prior to Admission medications   Medication Sig Start Date End Date Taking? Authorizing Provider  albuterol (VENTOLIN HFA) 108 (90 Base) MCG/ACT inhaler Inhale 2 puffs into the lungs every 6 (six) hours as needed for wheezing or shortness of breath. 09/05/20   Vigg, Avanti, MD  clopidogrel (PLAVIX) 75 MG tablet Take by mouth. 11/26/18   [provider]  docusate sodium (COLACE) 100 MG capsule Take 1 capsule (100 mg total) by mouth 2 (two) times daily for 14 days. 09/13/20 09/27/20  [name], MD  doxycycline (VIBRAMYCIN) 100 MG capsule Take 100 mg by mouth 2 (two) times daily. Patient not taking: Reported on 09/13/2020 08/31/20   [provider]  methimazole (TAPAZOLE) 5 MG tablet Take 5 mg by mouth daily. 09/05/20   [provider]  mirtazapine (REMERON) 30 MG tablet Take 1 tablet (30 mg total) by mouth at bedtime. Please schedule office visit before any future refills. 09/05/20   Vigg, Avanti, MD  polyethylene glycol powder (GLYCOLAX/MIRALAX) 17 GM/SCOOP powder Take by mouth.  [provider]  predniSONE (DELTASONE) 20 MG tablet Take 20 mg by mouth 2 (two) times daily. Patient not taking: Reported on 09/13/2020 08/31/20   [provider]  pyridostigmine (MESTINON) 60 MG tablet 1/2 - 1 po tid 08/01/20   Sater, [name], MD  Rivaroxaban (XARELTO) 15 MG TABS tablet Take 15 mg by mouth daily with supper.    [provider]  rosuvastatin (CRESTOR) 20 MG tablet  Take 20 mg by mouth at bedtime. 09/02/20   [provider]    Allergies Patient has no known allergies.  Family History  Problem Relation Age of Onset   Colon cancer Brother     Social History Social History   Tobacco Use   Smoking status: Never   Smokeless tobacco: Never  Vaping Use   Vaping Use: Never used  Substance Use Topics   Alcohol use: Yes    Alcohol/week: 4.0 standard drinks    Types: 4 Cans of beer per week   Drug use: No    Review of Systems  Constitutional: No fever/chills Eyes: No visual changes. ENT: No sore throat. Respiratory: Denies cough Cardiovascular: Denies chest pain Gastrointestinal: Denies abdominal pain, positive constipation Genitourinary: Negative for dysuria. Musculoskeletal: Negative for back pain. Skin: Negative for rash. Psychiatric: no mood changes,     ____________________________________________   PHYSICAL EXAM:  VITAL SIGNS: ED Triage Vitals [09/24/20 1509]  Enc Vitals Group     BP 128/75     Pulse Rate 75     Resp 18     Temp 98.3 F (36.8 C)     Temp Source Oral     SpO2 98 %     Weight 128 lb (58.1 kg)     Height 5\' 9"  (1.753 m)     Head Circumference      Peak Flow      Pain Score 5     Pain Loc      Pain Edu?      Excl. in [place]?     Constitutional: Alert and oriented. Well appearing and in no acute distress. Eyes: Conjunctivae are normal.  Head: Atraumatic. Nose: No congestion/rhinnorhea. Mouth/Throat: Mucous membranes are moist.   Neck:  supple no lymphadenopathy noted Cardiovascular: Normal rate, regular rhythm. Heart sounds are normal Respiratory: Normal respiratory effort.  No retractions, lungs c t a  Abd: soft nontender bs normal all 4 quad GU: deferred Musculoskeletal: FROM all extremities, warm and well perfused Neurologic:  Normal speech and language.  Skin:  Skin is warm, dry and intact. No rash noted. Psychiatric: Mood and affect are normal. Speech and behavior are  normal.  ____________________________________________   LABS (all labs ordered are listed, but only abnormal results are displayed)  Labs Reviewed - No data to display ____________________________________________   ____________________________________________  RADIOLOGY  Abdomen 1 view  ____________________________________________   PROCEDURES  Procedure(s) performed: No  Procedures    ____________________________________________   INITIAL IMPRESSION / ASSESSMENT AND PLAN / ED COURSE  Pertinent labs & imaging results that were available during my care of the patient were reviewed by me and considered in my medical decision making (see chart for details).   Patient 80 year old male presents with constipation.  See HPI.  Physical exam shows patient per stable.  X-ray of the abdomen 1 view was reviewed by me confirmed by radiology to be negative for constipation or bowel obstruction.  Did discuss all the findings with the patient.  He states he is very relieved and is  just a sensation and not the actual constipation.  Encouraged him to continue to follow-up with his regular doctors.  Expressed the importance of him having an appointment with Dr. Tollie Pizza to consider colonoscopy due to the sensation.  He states he understands.  Is discharged stable condition.     Scott Guzman was evaluated in Emergency Department on 09/24/2020 for the symptoms described in the history of present illness. He was evaluated in the context of the global COVID-19 pandemic, which necessitated consideration that the patient might be at risk for infection with the SARS-CoV-2 virus that causes COVID-19. Institutional protocols and algorithms that pertain to the evaluation of patients at risk for COVID-19 are in a state of rapid change based on information released by regulatory bodies including the CDC and federal and state organizations. These policies and algorithms were followed during the patient's care in  the ED.    As part of my medical decision making, I reviewed the following data within the [place] History obtained from family, Nursing notes reviewed and incorporated, Old chart reviewed, Radiograph reviewed , Notes from prior ED visits, and [place] Controlled Substance Database  ____________________________________________   FINAL CLINICAL IMPRESSION(S) / ED DIAGNOSES  Final diagnoses:  Lower abdominal pain      NEW MEDICATIONS STARTED DURING THIS VISIT:  Discharge Medication List as of 09/24/2020  4:33 PM       Note:  This document was prepared using Dragon voice recognition software and may include unintentional dictation errors.    Versie Starks, PA-C 09/24/20 1647    Duffy Bruce, MD 09/26/20 [phone]

## 2020-09-24 NOTE — ED Notes (Signed)
Pt able to walk to room, gait steady. Pt states he has had constipation x 2 weeks.

## 2020-09-24 NOTE — ED Triage Notes (Signed)
Pt via POV from home. Pt c/o constipation for 2 weeks. Denies the use of narcotics. Denies pain at this time but states it hurts when he tries to use the bathroom. Pt has tried OTC medication with no relief. Pt is A&Ox4 and NAD

## 2020-09-24 NOTE — Discharge Instructions (Addendum)
Follow-up with your regular doctor.  Please call for an appointment.  Return emergency department

## 2020-09-27 DIAGNOSIS — Z92241 Personal history of systemic steroid therapy: Secondary | ICD-10-CM | POA: Diagnosis not present

## 2020-09-27 DIAGNOSIS — D509 Iron deficiency anemia, unspecified: Secondary | ICD-10-CM | POA: Diagnosis not present

## 2020-09-27 DIAGNOSIS — M6281 Muscle weakness (generalized): Secondary | ICD-10-CM | POA: Diagnosis not present

## 2020-09-27 DIAGNOSIS — R634 Abnormal weight loss: Secondary | ICD-10-CM | POA: Diagnosis not present

## 2020-09-28 DIAGNOSIS — K5900 Constipation, unspecified: Secondary | ICD-10-CM | POA: Diagnosis not present

## 2020-09-29 ENCOUNTER — Other Ambulatory Visit: Payer: Self-pay

## 2020-09-29 ENCOUNTER — Ambulatory Visit (INDEPENDENT_AMBULATORY_CARE_PROVIDER_SITE_OTHER): Payer: Medicare Other | Admitting: Internal Medicine

## 2020-09-29 VITALS — BP 131/73 | HR 76 | Temp 98.1°F | Ht 68.39 in | Wt 125.4 lb

## 2020-09-29 DIAGNOSIS — E0590 Thyrotoxicosis, unspecified without thyrotoxic crisis or storm: Secondary | ICD-10-CM

## 2020-09-29 DIAGNOSIS — R634 Abnormal weight loss: Secondary | ICD-10-CM

## 2020-09-29 DIAGNOSIS — F0390 Unspecified dementia without behavioral disturbance: Secondary | ICD-10-CM

## 2020-09-29 MED ORDER — METHIMAZOLE 5 MG PO TABS: 5.0000 mg | ORAL_TABLET | Freq: Every day | ORAL | 0 refills | Status: DC

## 2020-09-29 NOTE — Progress Notes (Signed)
BP 131/73   Pulse 76   Temp 98.1 F (36.7 C) (Oral)   Ht 5' 8.39" (1.737 m)   Wt 125 lb 6.4 oz (56.9 kg)   SpO2 100%   BMI 18.85 kg/m    Subjective:    Patient ID: Scott Guzman, male    DOB: 02-26-1941, 80 y.o.   MRN: [id number]  Chief Complaint  Patient presents with   High Thyroid Level    Here for follow on Endo referral    HPI: Scott Guzman is a 80 y.o. male  Pt is here for a fu seen endocrinology for such. Saw endo @ [facility] in [place]. Per wife, pt doesn't have graves disease.  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 Waneta Martins -- effect of amiodarone  Prednisone / methimazole. Thyroid imaging -  Seen Dr. Buddy Duty [phone]  08/01/2020 12:23 TSH: 1.090 Thyroxine (T4): 12.6 (H) Free Thyroxine Index: 3.9 T3 Uptake Ratio: 31  09/05/2020 15:45 TSH: <0.005 (L) T4,Free(Direct): 2.96 (H) Thyroxine (T4): 14.5 (H) Free Thyroxine Index: 6.1 (H) T3 Uptake Ratio: 42 (H)  09/06/2020 16:49  09/15/2020 10:12 T4,Free(Direct): 2.28 (H)  09/24/2020 15:46   Thyroid Problem Presents for follow-up visit. Symptoms include diarrhea, dry skin, fatigue, nail problem and weight loss. Patient reports no anxiety, cold intolerance, constipation, depressed mood, diaphoresis, hair loss, hoarse voice, leg swelling, palpitations, visual change or weight gain.   Chief Complaint  Patient presents with   High Thyroid Level    Here for follow on Endo referral    Relevant past medical, surgical, family and social history reviewed and updated as indicated. Interim medical history since our last visit reviewed. Allergies and medications reviewed and updated.  Review of Systems  Constitutional:  Positive for fatigue and weight loss. Negative for diaphoresis and weight gain.  HENT:  Negative for hoarse voice.   Cardiovascular:  Negative for palpitations.  Gastrointestinal:  Positive for diarrhea. Negative for  constipation.  Endocrine: Negative for cold intolerance.  Psychiatric/Behavioral:  The patient is not nervous/anxious.    Per HPI unless specifically indicated above     Objective:    BP 131/73   Pulse 76   Temp 98.1 F (36.7 C) (Oral)   Ht 5' 8.39" (1.737 m)   Wt 125 lb 6.4 oz (56.9 kg)   SpO2 100%   BMI 18.85 kg/m   Wt Readings from Last 3 Encounters:  09/29/20 125 lb 6.4 oz (56.9 kg)  09/24/20 128 lb (58.1 kg)  09/13/20 128 lb 12.8 oz (58.4 kg)    Physical Exam Vitals and nursing note reviewed.  Constitutional:      General: He is not in acute distress.    Appearance: Normal appearance. He is not ill-appearing or diaphoretic.  HENT:     Head: Normocephalic and atraumatic.     Right Ear: Tympanic membrane and external ear normal. There is no impacted cerumen.     Left Ear: External ear normal.     Nose: No congestion or rhinorrhea.     Mouth/Throat:     Pharynx: No oropharyngeal exudate or posterior oropharyngeal erythema.  Eyes:     Conjunctiva/sclera: Conjunctivae normal.     Pupils: Pupils are equal, round, and reactive to light.  Cardiovascular:     Rate and Rhythm: Normal rate and regular rhythm.     Heart sounds: No murmur heard.   No friction rub. No gallop.  Musculoskeletal:  Cervical back: Normal range of motion and neck supple. No rigidity or tenderness.     Left lower leg: No edema.  Neurological:     Mental Status: He is alert.    Results for orders placed or performed in visit on 09/05/20  Comprehensive metabolic panel  Result Value Ref Range   Glucose 96 65 - 99 mg/dL   BUN 39 (H) 8 - 27 mg/dL   Creatinine, Ser 1.28 (H) 0.76 - 1.27 mg/dL   eGFR 58 (L) >59 mL/min/1.73   BUN/Creatinine Ratio 30 (H) 10 - 24   Sodium 143 134 - 144 mmol/L   Potassium 4.8 3.5 - 5.2 mmol/L   Chloride 105 96 - 106 mmol/L   CO2 21 20 - 29 mmol/L   Calcium 9.4 8.6 - 10.2 mg/dL   Total Protein 5.8 (L) 6.0 - 8.5 g/dL   Albumin 3.9 3.7 - 4.7 g/dL   Globulin, Total  1.9 1.5 - 4.5 g/dL   Albumin/Globulin Ratio 2.1 1.2 - 2.2   Bilirubin Total 0.3 0.0 - 1.2 mg/dL   Alkaline Phosphatase 79 44 - 121 IU/L   AST 16 0 - 40 IU/L   ALT 21 0 - 44 IU/L  Thyroid Panel With TSH  Result Value Ref Range   TSH <0.005 (L) 0.450 - 4.500 uIU/mL   T4, Total 14.5 (H) 4.5 - 12.0 ug/dL   T3 Uptake Ratio 42 (H) 24 - 39 %   Free Thyroxine Index 6.1 (H) 1.2 - 4.9  T4, free  Result Value Ref Range   Free T4 2.28 (H) 0.82 - 1.77 ng/dL  PSA  Result Value Ref Range   Prostate Specific Ag, Serum 0.5 0.0 - 4.0 ng/mL  T4, free  Result Value Ref Range   Free T4 2.96 (H) 0.82 - 1.77 ng/dL  Specimen status report  Result Value Ref Range   specimen status report Comment         Current Outpatient Medications:    albuterol (VENTOLIN HFA) 108 (90 Base) MCG/ACT inhaler, Inhale 2 puffs into the lungs every 6 (six) hours as needed for wheezing or shortness of breath., Disp: 8 g, Rfl: 2   clopidogrel (PLAVIX) 75 MG tablet, Take by mouth., Disp: , Rfl:    methimazole (TAPAZOLE) 5 MG tablet, Take 5 mg by mouth daily., Disp: , Rfl:    mirtazapine (REMERON) 30 MG tablet, Take 1 tablet (30 mg total) by mouth at bedtime. Please schedule office visit before any future refills., Disp: 90 tablet, Rfl: 4   polyethylene glycol powder (GLYCOLAX/MIRALAX) 17 GM/SCOOP powder, Take by mouth., Disp: , Rfl:    predniSONE (DELTASONE) 20 MG tablet, Take 20 mg by mouth 2 (two) times daily., Disp: , Rfl:    pyridostigmine (MESTINON) 60 MG tablet, 1/2 - 1 po tid, Disp: 90 tablet, Rfl: 5   Rivaroxaban (XARELTO) 15 MG TABS tablet, Take 15 mg by mouth daily with supper., Disp: , Rfl:    rosuvastatin (CRESTOR) 20 MG tablet, Take 20 mg by mouth at bedtime., Disp: , Rfl:  No current facility-administered medications for this visit.  Facility-Administered Medications Ordered in Other Visits:    sodium chloride flush (NS) 0.9 % injection 3 mL, 3 mL, Intravenous, Q12H, [name], MD    Assessment &  Plan:  Elevated Ft4 :  Will need to fu with Endocrinology will try and call office. Lost another 3 lbs. Paged to d/w endocrinology spoke to Dr. Buddy Duty, he agrees that pt needs Tapazole will  Will start pt on tapazole 5 mg d/w pt. Has another appoitment on the 25th.   Ref. Range 08/01/2020 12:23 09/05/2020 15:45 09/06/2020 16:49 09/15/2020 10:12 09/24/2020 15:46  TSH Latest Ref Range: 0.450 - 4.500 uIU/mL 1.090 <0.005 (L)     T4,Free(Direct) Latest Ref Range: 0.82 - 1.77 ng/dL  2.96 (H)  2.28 (H)   Thyroxine (T4) Latest Ref Range: 4.5 - 12.0 ug/dL 12.6 (H) 14.5 (H)     Free Thyroxine Index Latest Ref Range: 1.2 - 4.9  3.9 6.1 (H)     T3 Uptake Ratio Latest Ref Range: 24 - 39 % 31 42 (H)      Weight loss :  Continues to have such  Dementia stable, to fu with neurology for such .   Problem List Items Addressed This Visit   None    No orders of the defined types were placed in this encounter.    Meds ordered this encounter  Medications   methimazole (TAPAZOLE) 5 MG tablet    Sig: Take 1 tablet (5 mg total) by mouth daily.    Dispense:  30 tablet    Refill:  0     Follow up plan: No follow-ups on file.

## 2020-10-05 ENCOUNTER — Telehealth: Payer: Self-pay | Admitting: Internal Medicine

## 2020-10-05 NOTE — Telephone Encounter (Signed)
Patient's wife asked for a call back and schedule the Medicare Annual Wellness Visit (AWV) virtually or by telephone.  Stated he has a lot going on and would have to do in the Fall.  Last AWV 06/16/2019  Please schedule at anytime with [facility]-Nurse Health Advisor.  45 minute appointment  Any questions, please call me at [phone]

## 2020-10-16 DIAGNOSIS — R634 Abnormal weight loss: Secondary | ICD-10-CM | POA: Diagnosis not present

## 2020-10-16 DIAGNOSIS — M6281 Muscle weakness (generalized): Secondary | ICD-10-CM | POA: Diagnosis not present

## 2020-10-16 DIAGNOSIS — R062 Wheezing: Secondary | ICD-10-CM | POA: Diagnosis not present

## 2020-10-16 DIAGNOSIS — I959 Hypotension, unspecified: Secondary | ICD-10-CM | POA: Diagnosis not present

## 2020-10-16 DIAGNOSIS — E0590 Thyrotoxicosis, unspecified without thyrotoxic crisis or storm: Secondary | ICD-10-CM | POA: Diagnosis not present

## 2020-10-27 ENCOUNTER — Encounter: Payer: Self-pay | Admitting: Internal Medicine

## 2020-10-27 ENCOUNTER — Telehealth (INDEPENDENT_AMBULATORY_CARE_PROVIDER_SITE_OTHER): Payer: Medicare Other | Admitting: Internal Medicine

## 2020-10-27 DIAGNOSIS — E0590 Thyrotoxicosis, unspecified without thyrotoxic crisis or storm: Secondary | ICD-10-CM | POA: Diagnosis not present

## 2020-10-27 DIAGNOSIS — K5900 Constipation, unspecified: Secondary | ICD-10-CM

## 2020-10-27 MED ORDER — ALBUTEROL SULFATE HFA 108 (90 BASE) MCG/ACT IN AERS: 2.0000 | INHALATION_SPRAY | Freq: Four times a day (QID) | RESPIRATORY_TRACT | 2 refills | Status: AC | PRN

## 2020-10-27 MED ORDER — DOCUSATE SODIUM 50 MG PO CAPS: 50.0000 mg | ORAL_CAPSULE | Freq: Every day | ORAL | 2 refills | Status: DC | PRN

## 2020-10-27 NOTE — Progress Notes (Addendum)
There were no vitals taken for this visit.   Subjective:    Patient ID: Scott Guzman, male    DOB: 12/16/1940, 80 y.o.   MRN: [id number]  This visit was completed via telephone due to the restrictions of the COVID-19 pandemic. All issues as above were discussed and addressed but no physical exam was performed. If it was felt that the patient should be evaluated in the office, they were directed there. The patient verbally consented to this visit. Patient was unable to complete an audio/visual visit due to Technical difficulties", "Lack of internet. Due to the catastrophic nature of the COVID-19 pandemic, this visit was done through audio contact only. Location of the patient: home Location of the provider: work Those involved with this call:  Provider: [name], MD CMA: Frazier Butt, [place] Desk/Registration: Roe Rutherford  Time spent on call:  15 minutes on the phone discussing health concerns. 10 minutes total spent in review of patient's record and preparation of their chart.  Chief Complaint  Patient presents with   Hyperthroidism   Constipation    Still struggling with, medication not working as well as he would like     HPI: Scott Guzman is a 80 y.o. male  Pt is on 10 mg of methimazole , increased dose per endocrinolgy Weight is stable, now 122 lbs. They are going to run the labs end of this month to make sure.  Wt Readings from Last 3 Encounters: 09/29/20 : 125 lb 6.4 oz (56.9 kg) 09/24/20 : 128 lb (58.1 kg) 09/13/20 : 128 lb 12.8 oz (58.4 kg)  No more diarrhea, no constipaiton has been having   Wheezing  This is a chronic (sees pulm in [place].) problem. The current episode started more than 1 year ago. Pertinent negatives include no abdominal pain, chest pain, chills, coryza, coughing, diarrhea, ear pain, fever, headaches, neck pain, rash, rhinorrhea, shortness of breath, sore throat, sputum production, swollen glands or vomiting.   Chief Complaint  Patient  presents with   Hyperthroidism   Constipation    Still struggling with, medication not working as well as he would like     Relevant past medical, surgical, family and social history reviewed and updated as indicated. Interim medical history since our last visit reviewed. Allergies and medications reviewed and updated.  Review of Systems  Constitutional:  Negative for chills and fever.  HENT:  Negative for ear pain, rhinorrhea and sore throat.   Respiratory:  Positive for wheezing. Negative for cough, sputum production and shortness of breath.   Cardiovascular:  Negative for chest pain.  Gastrointestinal:  Negative for abdominal pain, diarrhea and vomiting.  Musculoskeletal:  Negative for neck pain.  Skin:  Negative for rash.  Neurological:  Negative for headaches.   Per HPI unless specifically indicated above     Objective:    There were no vitals taken for this visit.  Wt Readings from Last 3 Encounters:  09/29/20 125 lb 6.4 oz (56.9 kg)  09/24/20 128 lb (58.1 kg)  09/13/20 128 lb 12.8 oz (58.4 kg)    Physical Exam  Unable to peform sec to virtual visit.   Results for orders placed or performed in visit on 09/05/20  Comprehensive metabolic panel  Result Value Ref Range   Glucose 96 65 - 99 mg/dL   BUN 39 (H) 8 - 27 mg/dL   Creatinine, Ser 1.28 (H) 0.76 - 1.27 mg/dL   eGFR 58 (L) >59 mL/min/1.73   BUN/Creatinine Ratio  30 (H) 10 - 24   Sodium 143 134 - 144 mmol/L   Potassium 4.8 3.5 - 5.2 mmol/L   Chloride 105 96 - 106 mmol/L   CO2 21 20 - 29 mmol/L   Calcium 9.4 8.6 - 10.2 mg/dL   Total Protein 5.8 (L) 6.0 - 8.5 g/dL   Albumin 3.9 3.7 - 4.7 g/dL   Globulin, Total 1.9 1.5 - 4.5 g/dL   Albumin/Globulin Ratio 2.1 1.2 - 2.2   Bilirubin Total 0.3 0.0 - 1.2 mg/dL   Alkaline Phosphatase 79 44 - 121 IU/L   AST 16 0 - 40 IU/L   ALT 21 0 - 44 IU/L  Thyroid Panel With TSH  Result Value Ref Range   TSH <0.005 (L) 0.450 - 4.500 uIU/mL   T4, Total 14.5 (H) 4.5 - 12.0  ug/dL   T3 Uptake Ratio 42 (H) 24 - 39 %   Free Thyroxine Index 6.1 (H) 1.2 - 4.9  T4, free  Result Value Ref Range   Free T4 2.28 (H) 0.82 - 1.77 ng/dL  PSA  Result Value Ref Range   Prostate Specific Ag, Serum 0.5 0.0 - 4.0 ng/mL  T4, free  Result Value Ref Range   Free T4 2.96 (H) 0.82 - 1.77 ng/dL  Specimen status report  Result Value Ref Range   specimen status report Comment         Current Outpatient Medications:    clopidogrel (PLAVIX) 75 MG tablet, Take by mouth., Disp: , Rfl:    docusate sodium (COLACE) 50 MG capsule, Take 1 capsule (50 mg total) by mouth daily as needed for mild constipation., Disp: 30 capsule, Rfl: 2   methimazole (TAPAZOLE) 5 MG tablet, Take 1 tablet (5 mg total) by mouth daily. (Patient taking differently: Take 10 mg by mouth daily.), Disp: 30 tablet, Rfl: 0   mirtazapine (REMERON) 30 MG tablet, Take 1 tablet (30 mg total) by mouth at bedtime. Please schedule office visit before any future refills., Disp: 90 tablet, Rfl: 4   polyethylene glycol powder (GLYCOLAX/MIRALAX) 17 GM/SCOOP powder, Take by mouth., Disp: , Rfl:    predniSONE (DELTASONE) 20 MG tablet, Take 20 mg by mouth 2 (two) times daily., Disp: , Rfl:    pyridostigmine (MESTINON) 60 MG tablet, 1/2 - 1 po tid, Disp: 90 tablet, Rfl: 5   Rivaroxaban (XARELTO) 15 MG TABS tablet, Take 15 mg by mouth daily with supper., Disp: , Rfl:    rosuvastatin (CRESTOR) 20 MG tablet, Take 20 mg by mouth at bedtime., Disp: , Rfl:    albuterol (VENTOLIN HFA) 108 (90 Base) MCG/ACT inhaler, Inhale 2 puffs into the lungs every 6 (six) hours as needed for wheezing or shortness of breath., Disp: 8 g, Rfl: 2 No current facility-administered medications for this visit.  Facility-Administered Medications Ordered in Other Visits:    sodium chloride flush (NS) 0.9 % injection 3 mL, 3 mL, Intravenous, Q12H, [name], MD    Assessment & Plan:  Hyperthyroidism :  Seeing endocrinology. Increase in methimazole 10  mg per endocrinology Weight stable per pt and wife.   Ref. Range 09/05/2020 15:45 09/06/2020 16:49 09/15/2020 10:12  TSH Latest Ref Range: 0.450 - 4.500 uIU/mL <0.005 (L)    T4,Free(Direct) Latest Ref Range: 0.82 - 1.77 ng/dL 2.96 (H)  2.28 (H)  Thyroxine (T4) Latest Ref Range: 4.5 - 12.0 ug/dL 14.5 (H)    Free Thyroxine Index Latest Ref Range: 1.2 - 4.9  6.1 (H)    T3 Uptake  Ratio Latest Ref Range: 24 - 39 % 42 (H)     2. Wheezing - isn't on albuterol  Will resend. To fu with pulm asap    Problem List Items Addressed This Visit   None   No orders of the defined types were placed in this encounter.    Meds ordered this encounter  Medications   albuterol (VENTOLIN HFA) 108 (90 Base) MCG/ACT inhaler    Sig: Inhale 2 puffs into the lungs every 6 (six) hours as needed for wheezing or shortness of breath.    Dispense:  8 g    Refill:  2   docusate sodium (COLACE) 50 MG capsule    Sig: Take 1 capsule (50 mg total) by mouth daily as needed for mild constipation.    Dispense:  30 capsule    Refill:  2     Follow up plan: Return in about 2 months (around 12/27/2020).

## 2020-10-30 ENCOUNTER — Encounter: Payer: Self-pay | Admitting: Internal Medicine

## 2020-11-10 ENCOUNTER — Telehealth: Payer: Medicare Other | Admitting: Internal Medicine

## 2020-11-17 DIAGNOSIS — E0590 Thyrotoxicosis, unspecified without thyrotoxic crisis or storm: Secondary | ICD-10-CM | POA: Diagnosis not present

## 2020-12-05 ENCOUNTER — Encounter: Payer: Self-pay | Admitting: Internal Medicine

## 2020-12-05 ENCOUNTER — Other Ambulatory Visit
Admission: RE | Admit: 2020-12-05 | Discharge: 2020-12-05 | Disposition: A | Discharge status: Home / Self Care | Payer: Medicare Other | Attending: Internal Medicine | Admitting: Internal Medicine

## 2020-12-05 ENCOUNTER — Other Ambulatory Visit: Payer: Self-pay

## 2020-12-05 ENCOUNTER — Telehealth: Payer: Self-pay

## 2020-12-05 ENCOUNTER — Ambulatory Visit: Payer: Medicare Other | Admitting: Internal Medicine

## 2020-12-05 DIAGNOSIS — R0600 Dyspnea, unspecified: Secondary | ICD-10-CM | POA: Insufficient documentation

## 2020-12-05 DIAGNOSIS — R0609 Other forms of dyspnea: Secondary | ICD-10-CM

## 2020-12-05 LAB — CBC WITH DIFFERENTIAL/PLATELET
Abs Immature Granulocytes: 0.01 10*3/uL (ref 0.00–0.07)
Basophils Absolute: 0.1 10*3/uL (ref 0.0–0.1)
Basophils Relative: 2 %
Eosinophils Absolute: 0.1 10*3/uL (ref 0.0–0.5)
Eosinophils Relative: 3 %
HCT: 41.1 % (ref 39.0–52.0)
Hemoglobin: 13.9 g/dL (ref 13.0–17.0)
Immature Granulocytes: 0 %
Lymphocytes Relative: 23 %
Lymphs Abs: 1.1 10*3/uL (ref 0.7–4.0)
MCH: 30.3 pg (ref 26.0–34.0)
MCHC: 33.8 g/dL (ref 30.0–36.0)
MCV: 89.7 fL (ref 80.0–100.0)
Monocytes Absolute: 0.4 10*3/uL (ref 0.1–1.0)
Monocytes Relative: 8 %
Neutro Abs: 3.1 10*3/uL (ref 1.7–7.7)
Neutrophils Relative %: 64 %
Platelets: 223 10*3/uL (ref 150–400)
RBC: 4.58 MIL/uL (ref 4.22–5.81)
RDW: 13.8 % (ref 11.5–15.5)
WBC: 4.9 10*3/uL (ref 4.0–10.5)
nRBC: 0 % (ref 0.0–0.2)

## 2020-12-05 LAB — BRAIN NATRIURETIC PEPTIDE: B Natriuretic Peptide: 65.1 pg/mL (ref 0.0–100.0)

## 2020-12-05 NOTE — Progress Notes (Signed)
Scott Guzman, male    DOB: [birth year]    MRN: [id number]   Brief patient profile:  84 yowm never smoker  referred to pulmonary clinic in [facility]  12/05/2020 by Dr  Buddy Duty for wheezing  / being treated for hyperthyroidism and MG      History of Present Illness  12/05/2020  Pulmonary/ 1st office eval/ Scott Guzman / [facility]  Chief Complaint  Patient presents with   Consult    Wheezing- has now got better.    Dyspnea:  used to be able to walk mb to back of his lot and x 4 months x 1000 ft up slt grade can still do it s stopping but says it's a struggle  Cough: most nights wife notes cough / doesn't bother pt / no am flare / was wheezing per wife but not now  Sleep: flat with one pillow  SABA use: not helping  No recent prednsione rx  No obvious day to day or daytime variability or assoc excess/ purulent sputum or mucus plugs or hemoptysis or cp or chest tightness, subjective wheeze or overt sinus or hb symptoms.   Sleeping now  without being aware of any nocturnal  or early am exacerbation  of respiratory  c/o's or need for noct saba. Also denies any obvious fluctuation of symptoms with weather or environmental changes or other aggravating or alleviating factors except as outlined above   No unusual exposure hx or h/o childhood pna/ asthma or knowledge of premature birth.  Current Allergies, Complete Past Medical History, Past Surgical History, Family History, and Social History were reviewed in [employer] record.  ROS  The following are not active complaints unless bolded Hoarseness, sore throat, dysphagia, dental problems, itching, sneezing,  nasal congestion or discharge of excess mucus or purulent secretions, ear ache,   fever, chills, sweats, unintended wt loss or wt gain, classically pleuritic or exertional cp,  orthopnea pnd or arm/hand swelling  or leg swelling, presyncope, palpitations, abdominal pain, anorexia, nausea, vomiting, diarrhea  or change in  bowel habits or change in bladder habits, change in stools or change in urine, dysuria, hematuria,  rash, arthralgias, visual complaints, headache, numbness, weakness or ataxia or problems with walking or coordination,  change in mood or  memory.           Past Medical History:  Diagnosis Date   Colon cancer ([place]) 2000   He states he had a partial colon resection.    Coronary artery disease    Dysphagia    Dyspnea    Dysrhythmia    Atrial Fibrillation   Hyperlipidemia    Hypertension    PVC (premature ventricular contraction)    Temporal arteritis ([name])     Outpatient Medications Prior to Visit  Medication Sig Dispense Refill   albuterol (VENTOLIN HFA) 108 (90 Base) MCG/ACT inhaler Inhale 2 puffs into the lungs every 6 (six) hours as needed for wheezing or shortness of breath. 8 g 2   clopidogrel (PLAVIX) 75 MG tablet Take by mouth.     docusate sodium (COLACE) 50 MG capsule Take 1 capsule (50 mg total) by mouth daily as needed for mild constipation. 30 capsule 2   methimazole (TAPAZOLE) 5 MG tablet Take 1 tablet (5 mg total) by mouth daily. (Patient taking differently: Take 10 mg by mouth daily.) 30 tablet 0   mirtazapine (REMERON) 30 MG tablet Take 1 tablet (30 mg total) by mouth at bedtime. Please schedule office visit before  any future refills. 90 tablet 4   polyethylene glycol powder (GLYCOLAX/MIRALAX) 17 GM/SCOOP powder Take by mouth.     predniSONE (DELTASONE) 20 MG tablet Take 20 mg by mouth 2 (two) times daily.     pyridostigmine (MESTINON) 60 MG tablet 1/2 - 1 po tid 90 tablet 5   Rivaroxaban (XARELTO) 15 MG TABS tablet Take 15 mg by mouth daily with supper.     rosuvastatin (CRESTOR) 20 MG tablet Take 20 mg by mouth at bedtime.     Facility-Administered Medications Prior to Visit  Medication Dose Route Frequency Provider Last Rate Last Admin   sodium chloride flush (NS) 0.9 % injection 3 mL  3 mL Intravenous Q12H Neoma Laming A, MD         Objective:     BP 106/80  (BP Location: Left Arm, Patient Position: Sitting, Cuff Size: Normal)   Pulse (!) 59   Temp 98.1 F (36.7 C) (Oral)   Ht '5\' 9"'$  (1.753 m)   Wt 130 lb (59 kg)   SpO2 98%   BMI 19.20 kg/m   SpO2: 98 %  Thin Amb wm ? Apathetic hyperthyroidism with somber mood    HEENT : pt wearing mask not removed for exam due to covid -19 concerns.    NECK :  without JVD/Nodes/TM/ nl carotid upstrokes bilaterally   LUNGS: no acc muscle use,  Nl contour chest which is clear to A and P bilaterally without cough on insp or exp maneuvers   CV:  RRR  no s3 or murmur or increase in P2, and no edema   ABD:  soft and nontender with nl inspiratory excursion in the supine position. No bruits or organomegaly appreciated, bowel sounds nl  MS:  Nl gait/ ext warm without deformities, calf tenderness, cyanosis or clubbing No obvious joint restrictions   SKIN: warm and dry without lesions    NEURO:  alert, approp, nl sensorium with  no motor or cerebellar deficits apparent.   CT chest 09/06/20 nl   Labs ordered 12/05/2020  :  allergy profile      Labs ordered or reviewed from priors:      Chemistry      Component Value Date/Time   NA 143 09/05/2020 1545   NA 143 10/20/2013 0150   K 4.8 09/05/2020 1545   K 4.2 10/20/2013 0150   CL 105 09/05/2020 1545   CL 106 10/20/2013 0150   CO2 21 09/05/2020 1545   CO2 30 10/20/2013 0150   BUN 39 (H) 09/05/2020 1545   BUN 26 (H) 10/20/2013 0150   CREATININE 1.28 (H) 09/05/2020 1545   CREATININE 1.83 (H) 10/20/2013 0150      Component Value Date/Time   CALCIUM 9.4 09/05/2020 1545   CALCIUM 8.4 (L) 10/20/2013 0150   ALKPHOS 79 09/05/2020 1545   ALKPHOS 57 10/20/2013 0150   AST 16 09/05/2020 1545   AST 27 10/20/2013 0150   ALT 21 09/05/2020 1545   ALT 23 10/20/2013 0150   BILITOT 0.3 09/05/2020 1545   BILITOT 0.8 10/20/2013 0150        Lab Results  Component Value Date   WBC 4.9 12/05/2020   HGB 13.9 12/05/2020   HCT 41.1 12/05/2020   MCV 89.7  12/05/2020   PLT 223 12/05/2020     No results found for: DDIMER    Lab Results  Component Value Date   TSH <0.005 (L) 09/05/2020      BNP  12/05/2020   = 65  Assessment   No problem-specific Assessment & Plan notes found for this encounter.     Christinia Gully, MD 12/05/2020

## 2020-12-05 NOTE — Patient Instructions (Addendum)
Make sure you check your oxygen saturation at your highest level of activity to be sure it stays over 90% and keep track of it at least once a week, more often if breathing getting worse, and let me know if losing ground.   Try prilosec otc '20mg'$   Take 30-60 min before first meal of the day and Pepcid ac (famotidine) 20 mg one @ supper  until cough is completely gone for at least a week    GERD (REFLUX)  is an extremely common cause of respiratory symptoms just like yours , many times with no obvious heartburn at all.    It can be treated with medication, but also with lifestyle changes including elevation of the head of your bed (ideally with 6 -8inch blocks under the headboard of your bed),  Smoking cessation, avoidance of late meals, excessive alcohol, and avoid fatty foods, chocolate, peppermint, colas, red wine, and acidic juices such as orange juice.  NO MINT OR MENTHOL PRODUCTS SO NO COUGH DROPS  USE SUGARLESS CANDY INSTEAD (Jolley ranchers or Stover's or Life Savers) or even ice chips will also do - the key is to swallow to prevent all throat clearing. NO OIL BASED VITAMINS - use powdered substitutes.  Avoid fish oil when coughing.    Please remember to go to the lab department   for your tests - we will call you with the results when they are available.   Follow up if not better in 6 weeks but it would make more sense to wait until the thyroid problem is corrected

## 2020-12-05 NOTE — Telephone Encounter (Signed)
ATC patient in regards to his appointment,

## 2020-12-06 ENCOUNTER — Encounter: Payer: Self-pay | Admitting: Internal Medicine

## 2020-12-06 NOTE — Addendum Note (Signed)
Addended by: Christinia Gully B on: 12/06/2020 05:37 AM   Modules accepted: Orders

## 2020-12-06 NOTE — Assessment & Plan Note (Addendum)
Onset aorund 5/ 2022 in setting of new dx of hyperthyroidism - 12/05/2020   Walked on RA x  3  lap(s) =  approx 225 @ mod fast pace, stopped due to end of study  with lowest 02 sats 99%  -  Allergy profile 12/05/2020 >  Eos 0.1 /  IgE     Symptoms are markedly disproportionate to objective findings and not clear to what extent this is actually a pulmonary  problem but pt does appear to have difficult to sort out respiratory symptoms of unknown origin for which  DDX  = almost all start with A and  include Adherence, Ace Inhibitors, Acid Reflux, Active Sinus Disease, Alpha 1 Antitripsin deficiency, Anxiety masquerading as Airways dz,  ABPA,  Allergy(esp in young), Aspiration (esp in elderly), Adverse effects of meds,  Active smoking or Vaping, A bunch of PE's/clot burden (a few small clots can't cause this syndrome unless there is already severe underlying pulm or vascular dz with poor reserve),  Anemia or thyroid disorder, plus two Bs  = Bronchiectasis and Beta blocker use..and one C= CHF     Adherence is always the initial "prime suspect" and is a multilayered concern that requires a "trust but verify" approach in every patient - starting with knowing how to use medications, especially inhalers, correctly, keeping up with refills and understanding the fundamental difference between maintenance and prns vs those medications only taken for a very short course and then stopped and not refilled.  - return if not better  with all meds in hand using a trust but verify approach to confirm accurate Medication  Reconciliation The principal here is that until we are certain that the  patients are doing what we've asked, it makes no sense to ask them to do more.   ? Acid (or non-acid) GERD > always difficult to exclude as up to 75% of pts in some series report no assoc GI/ Heartburn symptoms> rec max (24h)  acid suppression and diet restrictions/ reviewed and instructions given in writing.   ? Allergy/ asthma >  reported wheezing is more likely to be due to Upper airway cough syndrome (previously labeled PNDS),  is so named because it's frequently impossible to sort out how much is  CR/sinusitis with freq throat clearing (which can be related to primary GERD)   vs  causing  secondary (" extra esophageal")  GERD from wide swings in gastric pressure that occur with throat clearing, often  promoting self use of mint and menthol lozenges that reduce the lower esophageal sphincter tone and exacerbate the problem further in a cyclical fashion.   These are the same pts (now being labeled as having "irritable larynx syndrome" by some cough centers) who not infrequently have a history of having failed to tolerate ace inhibitors,  dry powder inhalers or biphosphonates or report having atypical/extraesophageal reflux symptoms that don't respond to standard doses of PPI  and are easily confused as having aecopd or asthma flares by even experienced allergists/ pulmonologists (myself included).  >>> rx for gerd for now  ? Anemia/ thyroid dz > f/u here if doe not better p hyperthyroidism is corrected  ? Anxiety/depression/ deconditioning > usually at the bottom of this list of usual suspects but should be much higher on this pt's based on H and P and note already on psychotropics and may interfere with adherence and also interpretation of response or lack thereof to symptom management which can be quite subjective.    ? CHF >  bnp nl, ct chest  does not support.   Each maintenance medication was reviewed in detail including emphasizing most importantly the difference between maintenance and prns and under what circumstances the prns are to be triggered using an action plan format where appropriate.  Total time for H and P, chart review, counseling,  directly observing portions of ambulatory 02 saturation study/ and generating customized AVS unique to this office visit / same day charting = 34 min

## 2020-12-09 LAB — IGE: IgE (Immunoglobulin E), Serum: 507 IU/mL — ABNORMAL HIGH (ref 6–495)

## 2020-12-14 DIAGNOSIS — R0602 Shortness of breath: Secondary | ICD-10-CM | POA: Diagnosis not present

## 2020-12-14 DIAGNOSIS — I2510 Atherosclerotic heart disease of native coronary artery without angina pectoris: Secondary | ICD-10-CM | POA: Diagnosis not present

## 2020-12-14 DIAGNOSIS — I10 Essential (primary) hypertension: Secondary | ICD-10-CM | POA: Diagnosis not present

## 2020-12-14 DIAGNOSIS — I4891 Unspecified atrial fibrillation: Secondary | ICD-10-CM | POA: Diagnosis not present

## 2020-12-15 NOTE — Progress Notes (Signed)
Spoke with pt and notified of results per Dr. Wert. Pt verbalized understanding and denied any questions. 

## 2020-12-18 DIAGNOSIS — R946 Abnormal results of thyroid function studies: Secondary | ICD-10-CM | POA: Diagnosis not present

## 2020-12-18 DIAGNOSIS — E0590 Thyrotoxicosis, unspecified without thyrotoxic crisis or storm: Secondary | ICD-10-CM | POA: Diagnosis not present

## 2020-12-18 DIAGNOSIS — M6281 Muscle weakness (generalized): Secondary | ICD-10-CM | POA: Diagnosis not present

## 2020-12-19 ENCOUNTER — Other Ambulatory Visit: Payer: Self-pay | Admitting: Internal Medicine

## 2020-12-19 DIAGNOSIS — R946 Abnormal results of thyroid function studies: Secondary | ICD-10-CM

## 2020-12-25 DIAGNOSIS — R319 Hematuria, unspecified: Secondary | ICD-10-CM | POA: Diagnosis not present

## 2020-12-26 ENCOUNTER — Telehealth: Payer: Self-pay

## 2020-12-26 NOTE — Telephone Encounter (Signed)
Copied from [place] [id number]. Topic: Appointment Scheduling - Scheduling Inquiry for Clinic >> Dec 25, 2020  8:42 AM Valere Dross wrote: Reason for CRM: Pts wife called in wanting to get pt a labs ordered, and requested a call back to get appt, please advise.

## 2020-12-28 ENCOUNTER — Emergency Department: Payer: Medicare Other

## 2020-12-28 ENCOUNTER — Other Ambulatory Visit: Payer: Self-pay

## 2020-12-28 ENCOUNTER — Emergency Department
Admission: EM | Admit: 2020-12-28 | Discharge: 2020-12-28 | Disposition: A | Discharge status: Home / Self Care | Payer: Medicare Other | Attending: Emergency Medicine | Admitting: Emergency Medicine

## 2020-12-28 DIAGNOSIS — I2510 Atherosclerotic heart disease of native coronary artery without angina pectoris: Secondary | ICD-10-CM | POA: Insufficient documentation

## 2020-12-28 DIAGNOSIS — N183 Chronic kidney disease, stage 3 unspecified: Secondary | ICD-10-CM | POA: Diagnosis not present

## 2020-12-28 DIAGNOSIS — I129 Hypertensive chronic kidney disease with stage 1 through stage 4 chronic kidney disease, or unspecified chronic kidney disease: Secondary | ICD-10-CM | POA: Diagnosis not present

## 2020-12-28 DIAGNOSIS — R55 Syncope and collapse: Secondary | ICD-10-CM

## 2020-12-28 DIAGNOSIS — R202 Paresthesia of skin: Secondary | ICD-10-CM | POA: Diagnosis not present

## 2020-12-28 DIAGNOSIS — I4891 Unspecified atrial fibrillation: Secondary | ICD-10-CM | POA: Insufficient documentation

## 2020-12-28 DIAGNOSIS — Z7901 Long term (current) use of anticoagulants: Secondary | ICD-10-CM | POA: Insufficient documentation

## 2020-12-28 DIAGNOSIS — Z85038 Personal history of other malignant neoplasm of large intestine: Secondary | ICD-10-CM | POA: Diagnosis not present

## 2020-12-28 HISTORY — DX: Disorder of thyroid, unspecified: E07.9

## 2020-12-28 LAB — COMPREHENSIVE METABOLIC PANEL
ALT: 16 U/L (ref 0–44)
AST: 30 U/L (ref 15–41)
Albumin: 4.3 g/dL (ref 3.5–5.0)
Alkaline Phosphatase: 67 U/L (ref 38–126)
Anion gap: 10 (ref 5–15)
BUN: 22 mg/dL (ref 8–23)
CO2: 27 mmol/L (ref 22–32)
Calcium: 9.4 mg/dL (ref 8.9–10.3)
Chloride: 100 mmol/L (ref 98–111)
Creatinine, Ser: 1.21 mg/dL (ref 0.61–1.24)
GFR, Estimated: >60 mL/min (ref >60)
Glucose, Bld: 92 mg/dL (ref 70–99)
Potassium: 4.3 mmol/L (ref 3.5–5.1)
Sodium: 137 mmol/L (ref 135–145)
Total Bilirubin: 0.9 mg/dL (ref 0.3–1.2)
Total Protein: 6.7 g/dL (ref 6.5–8.1)

## 2020-12-28 LAB — CBC WITH DIFFERENTIAL/PLATELET
Abs Immature Granulocytes: 0.01 10*3/uL (ref 0.00–0.07)
Basophils Absolute: 0.1 10*3/uL (ref 0.0–0.1)
Basophils Relative: 1 %
Eosinophils Absolute: 0.1 10*3/uL (ref 0.0–0.5)
Eosinophils Relative: 3 %
HCT: 43.5 % (ref 39.0–52.0)
Hemoglobin: 14.3 g/dL (ref 13.0–17.0)
Immature Granulocytes: 0 %
Lymphocytes Relative: 24 %
Lymphs Abs: 1.1 10*3/uL (ref 0.7–4.0)
MCH: 29.8 pg (ref 26.0–34.0)
MCHC: 32.9 g/dL (ref 30.0–36.0)
MCV: 90.6 fL (ref 80.0–100.0)
Monocytes Absolute: 0.3 10*3/uL (ref 0.1–1.0)
Monocytes Relative: 7 %
Neutro Abs: 2.9 10*3/uL (ref 1.7–7.7)
Neutrophils Relative %: 65 %
Platelets: 219 10*3/uL (ref 150–400)
RBC: 4.8 MIL/uL (ref 4.22–5.81)
RDW: 14.2 % (ref 11.5–15.5)
WBC: 4.5 10*3/uL (ref 4.0–10.5)
nRBC: 0 % (ref 0.0–0.2)

## 2020-12-28 LAB — TROPONIN I (HIGH SENSITIVITY)
Troponin I (High Sensitivity): 22 ng/L — ABNORMAL HIGH (ref <18)
Troponin I (High Sensitivity): 24 ng/L — ABNORMAL HIGH (ref <18)

## 2020-12-28 NOTE — ED Provider Notes (Signed)
HPI: Pt is a 80 y.o. male who presents with complaints of syncope   The patient p/w  syncope after dental injection. Just local anesthesia.  Possible slurred speech and droopy eyes  ROS: Denies fever, chest pain, vomiting  Past Medical History:  Diagnosis Date   Colon cancer ([place]) 2000   He states he had a partial colon resection.    Coronary artery disease    Dysphagia    Dyspnea    Dysrhythmia    Atrial Fibrillation   Hyperlipidemia    Hypertension    PVC (premature ventricular contraction)    Temporal arteritis ([name])    Vitals:   12/28/20 1245  BP: (!) 147/87  Pulse: 66  Resp: 18  SpO2: 98%    Focused Physical Exam: Gen: No acute distress Head: atraumatic, normocephalic Eyes: Extraocular movements grossly intact; conjunctiva clear CV: RRR Lung: No increased WOB, no stridor GI: ND, no obvious masses Neuro: Alert and awake CN 2-12 intact NIHSS zero   Medical Decision Making and Plan: Given the patient's initial medical screening exam, the following diagnostic evaluation has been ordered. The patient will be placed in the appropriate treatment space, once one is available, to complete the evaluation and treatment. I have discussed the plan of care with the patient and I have advised the patient that an ED physician or mid-level practitioner will reevaluate their condition after the test results have been received, as the results may give them additional insight into the type of treatment they may need.   Diagnostics: CT<labs NO STROKE CODE normal NIHSS wife states he is at baseline   Treatments: none immediately   Vanessa Pierson, MD 12/28/20 1247

## 2020-12-28 NOTE — ED Provider Notes (Signed)
[facility] Emergency Department Provider Note   ____________________________________________   I have reviewed the triage vital signs and the nursing notes.   HISTORY  Chief Complaint Near Syncope   History limited by: Not Limited   HPI Scott Guzman is a 80 y.o. male who presents to the emergency department today because of concern for near syncopal episode that occurred at the dentists.  The patient states that he had just received his local anesthetics when he started feeling lightheaded.  He also noticed some tingling in his hands.  He denies any chest pain.  Did not feel like his heart was racing fast.  He states he has had similar episodes in the past.  He denies actually passing out.  By the time they were leaving the dentist office patient states he felt back to normal.  He denies any symptoms prior to going to the dentist office.   Records reviewed. Per medical record review patient has a history of atrial fibrillation.  Past Medical History:  Diagnosis Date   Colon cancer (Scott Guzman) 2000   He states he had a partial colon resection.    Coronary artery disease    Dysphagia    Dyspnea    Dysrhythmia    Atrial Fibrillation   Hyperlipidemia    Hypertension    PVC (premature ventricular contraction)    Temporal arteritis (Scott Guzman)    Thyroid disease     Patient Active Problem List   Diagnosis Date Noted   Hyperthyroidism 10/27/2020   Constipation 10/27/2020   Memory loss 08/01/2020   Orthostatic hypotension 08/01/2020   Vertigo 08/01/2020   Excessive daytime sleepiness 08/01/2020   Other thrombophilia (Scott Guzman) 03/04/2020   Aortic atherosclerosis (Scott Guzman) 03/04/2020   CKD (chronic kidney disease) stage 3, GFR 30-59 ml/min ([name]) 03/03/2020   Coronary artery disease 12/04/2018   Abnormality of gait 07/16/2017   Essential hypertension 07/16/2017   Obstructive hydrocephalus (Scott Guzman) 07/16/2017   Syncope and collapse 07/16/2017   Headache 07/16/2017    Cerebral ventriculomegaly 06/11/2017   Atrial fibrillation with controlled ventricular response (Scott Guzman) 06/03/2017   History of colon cancer 02/07/2017   History of compression fracture of spine 01/13/2017   DOE (dyspnea on exertion) 08/19/2016   Temporal arteritis (Scott Guzman) 03/12/2016   BPH (benign prostatic hyperplasia) 03/12/2016   Elevated erythrocyte sedimentation rate 01/29/2016   Insomnia 10/31/2014   Hypercholesterolemia 10/13/2014    Past Surgical History:  Procedure Laterality Date   BACK SURGERY     Kyphoplasty T12  (Nov. 2018)   COLON SURGERY     COLONOSCOPY WITH PROPOFOL N/A 05/05/2017   Procedure: COLONOSCOPY WITH PROPOFOL;  Surgeon: Manya Silvas, MD;  Location: [name];  Service: Endoscopy;  Laterality: N/A;   KYPHOPLASTY N/A 01/23/2017   Procedure: [id number];  Surgeon: [name], MD;  Location: [facility] ORS;  Service: Orthopedics;  Laterality: N/A;   LEFT HEART CATH AND CORONARY ANGIOGRAPHY Right 12/03/2018   Procedure: LEFT HEART CATH AND CORONARY ANGIOGRAPHY;  Surgeon: [name], MD;  Location: [name];  Service: Cardiovascular;  Laterality: Right;    Prior to Admission medications   Medication Sig Start Date End Date Taking? Authorizing Provider  albuterol (VENTOLIN HFA) 108 (90 Base) MCG/ACT inhaler Inhale 2 puffs into the lungs every 6 (six) hours as needed for wheezing or shortness of breath. 10/27/20   Vigg, Avanti, MD  docusate sodium (COLACE) 50 MG capsule Take 1 capsule (50 mg total) by mouth daily as needed for mild constipation.  10/27/20   Vigg, Avanti, MD  mirtazapine (REMERON) 30 MG tablet Take 1 tablet (30 mg total) by mouth at bedtime. Please schedule office visit before any future refills. 09/05/20   Vigg, Avanti, MD  polyethylene glycol powder (GLYCOLAX/MIRALAX) 17 GM/SCOOP powder Take by mouth.    [provider]  predniSONE (DELTASONE) 20 MG tablet Take 20 mg by mouth 2 (two) times daily. 08/31/20   [provider]  pyridostigmine (MESTINON) 60 MG tablet 1/2 - 1 po tid 08/01/20   Sater, [name], MD  Rivaroxaban (XARELTO) 15 MG TABS tablet Take 15 mg by mouth daily with supper.    [provider]  rosuvastatin (CRESTOR) 20 MG tablet Take 20 mg by mouth at bedtime. 09/02/20   [provider]    Allergies Patient has no known allergies.  Family History  Problem Relation Age of Onset   Colon cancer Brother     Social History Social History   Tobacco Use   Smoking status: Never   Smokeless tobacco: Never  Vaping Use   Vaping Use: Never used  Substance Use Topics   Alcohol use: Yes    Alcohol/week: 4.0 standard drinks    Types: 4 Cans of beer per week   Drug use: No    Review of Systems Constitutional: No fever/chills Eyes: No visual changes. ENT: No sore throat. Cardiovascular: Denies chest pain. Respiratory: Denies shortness of breath. Gastrointestinal: No abdominal pain.  No nausea, no vomiting.  No diarrhea.   Genitourinary: Negative for dysuria. Musculoskeletal: Negative for back pain. Skin: Negative for rash. Neurological: Positive for near syncopal episode.   ____________________________________________   PHYSICAL EXAM:  VITAL SIGNS: ED Triage Vitals  Enc Vitals Group     BP 12/28/20 1245 (!) 147/87     Pulse Rate 12/28/20 1245 66     Resp 12/28/20 1245 18     Temp 12/28/20 1253 98.1 F (36.7 C)     Temp Source 12/28/20 1253 Oral     SpO2 12/28/20 1245 98 %     Weight 12/28/20 1246 125 lb (56.7 kg)     Height 12/28/20 1246 5\' 9"  (1.753 m)     Head Circumference --      Peak Flow --      Pain Score 12/28/20 1246 0   Constitutional: Alert and oriented.  Eyes: Conjunctivae are normal.  ENT      Head: Normocephalic and atraumatic.      Nose: No congestion/rhinnorhea.      Mouth/Throat: Mucous membranes are moist.      Neck: No stridor. Hematological/Lymphatic/Immunilogical: No cervical lymphadenopathy. Cardiovascular: Normal rate, regular  rhythm.  No murmurs, rubs, or gallops.  Respiratory: Normal respiratory effort without tachypnea nor retractions. Breath sounds are clear and equal bilaterally. No wheezes/rales/rhonchi. Gastrointestinal: Soft and non tender. No rebound. No guarding.  Genitourinary: Deferred Musculoskeletal: Normal range of motion in all extremities. No lower extremity edema. Neurologic:  Normal speech and language. No gross focal neurologic deficits are appreciated.  Skin:  Skin is warm, dry and intact. No rash noted. Psychiatric: Mood and affect are normal. Speech and behavior are normal. Patient exhibits appropriate insight and judgment.  ____________________________________________    LABS (pertinent positives/negatives)  CMP wnl CBC wbc 4.5, hgb 14.3, plt 219 Trop hs 22 to 24 ____________________________________________   EKG  I, Nance Pear, attending physician, personally viewed and interpreted this EKG  EKG Time: 1246 Rate: 62 Rhythm: normal sinus rhythm Axis: normal Intervals: qtc 414 QRS: narrow,  q waves v1, v2 ST changes: no st elevation Impression: abnormal ekg  ____________________________________________    RADIOLOGY  CT head No acute intracranial process  ____________________________________________   PROCEDURES  Procedures  ____________________________________________   INITIAL IMPRESSION / ASSESSMENT AND PLAN / ED COURSE  Pertinent labs & imaging results that were available during my care of the patient were reviewed by me and considered in my medical decision making (see chart for details).   Patient presents to the emergency department today because of concern for near syncopal episode that occurred while at the dentist office. At the time of my exam the patient is feeling better. Troponin while minimally elevated did not have any significant increase and he has had slightly elevated troponins in the past. At this point I doubt ACS as cause of syncope.  Other blood work without any concerning abnormalities. At this time given that patient feels improved I think it reasonable to discharge. Discussed findings with patient.    ____________________________________________   FINAL CLINICAL IMPRESSION(S) / ED DIAGNOSES  Final diagnoses:  Near syncope     Note: This dictation was prepared with Dragon dictation. Any transcriptional errors that result from this process are unintentional     Nance Pear, MD 12/28/20 Vernelle Emerald

## 2020-12-28 NOTE — ED Triage Notes (Signed)
Pt at dentist office for a crown and passed out briefly per dental staff. Pt closed eyes and "fell asleep" after injection and then struggled to speak and then recovered. Pt's wife states pt is at baseline now. Pt alert and oriented x4. EDP in room.

## 2020-12-28 NOTE — ED Notes (Signed)
MD aware of BP; no new orders at this time.

## 2020-12-28 NOTE — Discharge Instructions (Addendum)
Please seek medical attention for any high fevers, chest pain, shortness of breath, change in behavior, persistent vomiting, bloody stool or any other new or concerning symptoms.  

## 2021-01-01 ENCOUNTER — Ambulatory Visit
Admission: RE | Admit: 2021-01-01 | Discharge: 2021-01-01 | Disposition: A | Discharge status: Home / Self Care | Payer: Medicare Other | Source: Ambulatory Visit | Attending: Internal Medicine | Admitting: Internal Medicine

## 2021-01-01 ENCOUNTER — Other Ambulatory Visit: Payer: Self-pay

## 2021-01-01 DIAGNOSIS — R946 Abnormal results of thyroid function studies: Secondary | ICD-10-CM | POA: Diagnosis not present

## 2021-01-14 ENCOUNTER — Emergency Department
Admission: EM | Admit: 2021-01-14 | Discharge: 2021-01-14 | Disposition: A | Discharge status: Home / Self Care | Payer: Medicare Other | Attending: Emergency Medicine | Admitting: Emergency Medicine

## 2021-01-14 ENCOUNTER — Encounter: Payer: Self-pay | Admitting: Emergency Medicine

## 2021-01-14 ENCOUNTER — Emergency Department: Payer: Medicare Other

## 2021-01-14 ENCOUNTER — Other Ambulatory Visit: Payer: Self-pay

## 2021-01-14 DIAGNOSIS — R109 Unspecified abdominal pain: Secondary | ICD-10-CM | POA: Diagnosis not present

## 2021-01-14 DIAGNOSIS — I129 Hypertensive chronic kidney disease with stage 1 through stage 4 chronic kidney disease, or unspecified chronic kidney disease: Secondary | ICD-10-CM | POA: Insufficient documentation

## 2021-01-14 DIAGNOSIS — I2510 Atherosclerotic heart disease of native coronary artery without angina pectoris: Secondary | ICD-10-CM | POA: Insufficient documentation

## 2021-01-14 DIAGNOSIS — Z743 Need for continuous supervision: Secondary | ICD-10-CM | POA: Diagnosis not present

## 2021-01-14 DIAGNOSIS — R079 Chest pain, unspecified: Secondary | ICD-10-CM | POA: Diagnosis not present

## 2021-01-14 DIAGNOSIS — R6889 Other general symptoms and signs: Secondary | ICD-10-CM | POA: Diagnosis not present

## 2021-01-14 DIAGNOSIS — R319 Hematuria, unspecified: Secondary | ICD-10-CM | POA: Insufficient documentation

## 2021-01-14 DIAGNOSIS — R002 Palpitations: Secondary | ICD-10-CM | POA: Insufficient documentation

## 2021-01-14 DIAGNOSIS — N183 Chronic kidney disease, stage 3 unspecified: Secondary | ICD-10-CM | POA: Insufficient documentation

## 2021-01-14 DIAGNOSIS — R0789 Other chest pain: Secondary | ICD-10-CM | POA: Diagnosis not present

## 2021-01-14 LAB — CBC
HCT: 42.8 % (ref 39.0–52.0)
Hemoglobin: 14.3 g/dL (ref 13.0–17.0)
MCH: 30.5 pg (ref 26.0–34.0)
MCHC: 33.4 g/dL (ref 30.0–36.0)
MCV: 91.3 fL (ref 80.0–100.0)
Platelets: 204 10*3/uL (ref 150–400)
RBC: 4.69 MIL/uL (ref 4.22–5.81)
RDW: 14 % (ref 11.5–15.5)
WBC: 5.1 10*3/uL (ref 4.0–10.5)
nRBC: 0 % (ref 0.0–0.2)

## 2021-01-14 LAB — URINALYSIS, ROUTINE W REFLEX MICROSCOPIC
RBC / HPF: >50 RBC/hpf — ABNORMAL HIGH (ref 0–5)
Specific Gravity, Urine: 1.024 (ref 1.005–1.030)
Squamous Epithelial / HPF: NONE SEEN (ref 0–5)

## 2021-01-14 LAB — TROPONIN I (HIGH SENSITIVITY): Troponin I (High Sensitivity): 24 ng/L — ABNORMAL HIGH (ref <18)

## 2021-01-14 LAB — BASIC METABOLIC PANEL
Anion gap: 15 (ref 5–15)
BUN: 24 mg/dL — ABNORMAL HIGH (ref 8–23)
CO2: 24 mmol/L (ref 22–32)
Calcium: 8.9 mg/dL (ref 8.9–10.3)
Chloride: 100 mmol/L (ref 98–111)
Creatinine, Ser: 1.22 mg/dL (ref 0.61–1.24)
GFR, Estimated: >60 mL/min (ref >60)
Glucose, Bld: 104 mg/dL — ABNORMAL HIGH (ref 70–99)
Potassium: 4 mmol/L (ref 3.5–5.1)
Sodium: 139 mmol/L (ref 135–145)

## 2021-01-14 NOTE — Discharge Instructions (Signed)
Please hold your Xarelto all the blood in your urine subsides.  Please call Dr. Marella Bile office tomorrow for a follow-up appointment

## 2021-01-14 NOTE — ED Triage Notes (Signed)
Pt arrived via ems from home. Pt c/o blood in urine, abd pain, cp yesterday, but denies any of these symptoms today. A&o x 4.  Hx afib.  Vitals 160/78 Hr 55 99% RA

## 2021-01-14 NOTE — ED Provider Notes (Signed)
[facility] Emergency Department Provider Note   ____________________________________________   Event Date/Time   First MD Initiated Contact with Patient 01/14/21 1654     (approximate)  I have reviewed the triage vital signs and the nursing notes.   HISTORY  Chief Complaint Back Pain, Hematuria, and Irregular Heart Beat    HPI Scott Guzman is a 80 y.o. male who presents for palpitations  LOCATION: Left chest DURATION: Occurred starting this morning TIMING: Resolved since onset SEVERITY: Severe QUALITY: Palpitations CONTEXT: Patient states that earlier this morning he began having palpitations in his left chest associated with lightheadedness and chest pain MODIFYING FACTORS: Denies any exacerbating or relieving factors however states that it resolved spontaneously ASSOCIATED SYMPTOMS: Chest pain, hematuria   Per medical record review, patient has history of paroxysmal A. fib on Xarelto          Past Medical History:  Diagnosis Date   Colon cancer ([place]) 2000   He states he had a partial colon resection.    Coronary artery disease    Dysphagia    Dyspnea    Dysrhythmia    Atrial Fibrillation   Hyperlipidemia    Hypertension    PVC (premature ventricular contraction)    Temporal arteritis ([place])    Thyroid disease     Patient Active Problem List   Diagnosis Date Noted   Hyperthyroidism 10/27/2020   Constipation 10/27/2020   Memory loss 08/01/2020   Orthostatic hypotension 08/01/2020   Vertigo 08/01/2020   Excessive daytime sleepiness 08/01/2020   Other thrombophilia ([place]) 03/04/2020   Aortic atherosclerosis ([place]) 03/04/2020   CKD (chronic kidney disease) stage 3, GFR 30-59 ml/min ([name]) 03/03/2020   Coronary artery disease 12/04/2018   Abnormality of gait 07/16/2017   Essential hypertension 07/16/2017   Obstructive hydrocephalus ([place]) 07/16/2017   Syncope and collapse 07/16/2017   Headache 07/16/2017   Cerebral  ventriculomegaly 06/11/2017   Atrial fibrillation with controlled ventricular response ([place]) 06/03/2017   History of colon cancer 02/07/2017   History of compression fracture of spine 01/13/2017   DOE (dyspnea on exertion) 08/19/2016   Temporal arteritis ([place]) 03/12/2016   BPH (benign prostatic hyperplasia) 03/12/2016   Elevated erythrocyte sedimentation rate 01/29/2016   Insomnia 10/31/2014   Hypercholesterolemia 10/13/2014    Past Surgical History:  Procedure Laterality Date   BACK SURGERY     Kyphoplasty T12  (Nov. 2018)   COLON SURGERY     COLONOSCOPY WITH PROPOFOL N/A 05/05/2017   Procedure: COLONOSCOPY WITH PROPOFOL;  Surgeon: Manya Silvas, MD;  Location: [facility] ENDOSCOPY;  Service: Endoscopy;  Laterality: N/A;   KYPHOPLASTY N/A 01/23/2017   Procedure: [id number];  Surgeon: [name], MD;  Location: [facility] ORS;  Service: Orthopedics;  Laterality: N/A;   LEFT HEART CATH AND CORONARY ANGIOGRAPHY Right 12/03/2018   Procedure: LEFT HEART CATH AND CORONARY ANGIOGRAPHY;  Surgeon: [name], MD;  Location: [place] CV LAB;  Service: Cardiovascular;  Laterality: Right;    Prior to Admission medications   Medication Sig Start Date End Date Taking? Authorizing Provider  albuterol (VENTOLIN HFA) 108 (90 Base) MCG/ACT inhaler Inhale 2 puffs into the lungs every 6 (six) hours as needed for wheezing or shortness of breath. 10/27/20   Vigg, Avanti, MD  docusate sodium (COLACE) 50 MG capsule Take 1 capsule (50 mg total) by mouth daily as needed for mild constipation. 10/27/20   Vigg, Avanti, MD  mirtazapine (REMERON) 30 MG tablet Take 1 tablet (30 mg total) by mouth  at bedtime. Please schedule office visit before any future refills. 09/05/20   Vigg, Avanti, MD  polyethylene glycol powder (GLYCOLAX/MIRALAX) 17 GM/SCOOP powder Take by mouth.    [provider]  predniSONE (DELTASONE) 20 MG tablet Take 20 mg by mouth 2 (two) times daily. 08/31/20   [provider]   pyridostigmine (MESTINON) 60 MG tablet 1/2 - 1 po tid 08/01/20   Sater, [name], MD  Rivaroxaban (XARELTO) 15 MG TABS tablet Take 15 mg by mouth daily with supper.    [provider]  rosuvastatin (CRESTOR) 20 MG tablet Take 20 mg by mouth at bedtime. 09/02/20   [provider]    Allergies Patient has no known allergies.  Family History  Problem Relation Age of Onset   Colon cancer Brother     Social History Social History   Tobacco Use   Smoking status: Never   Smokeless tobacco: Never  Vaping Use   Vaping Use: Never used  Substance Use Topics   Alcohol use: Yes    Alcohol/week: 4.0 standard drinks    Types: 4 Cans of beer per week   Drug use: No    Review of Systems Constitutional: No fever/chills Eyes: No visual changes. ENT: No sore throat. Cardiovascular: Endorses palpitations and chest pain. Respiratory: Denies shortness of breath. Gastrointestinal: No abdominal pain.  No nausea, no vomiting.  No diarrhea. Genitourinary: Negative for dysuria.  Positive for hematuria Musculoskeletal: Negative for acute arthralgias Skin: Negative for rash. Neurological: Negative for headaches, weakness/numbness/paresthesias in any extremity Psychiatric: Negative for suicidal ideation/homicidal ideation   ____________________________________________   PHYSICAL EXAM:  VITAL SIGNS: ED Triage Vitals  Enc Vitals Group     BP 01/14/21 1432 (!) 154/85     Pulse Rate 01/14/21 1432 (!) 57     Resp 01/14/21 1432 19     Temp 01/14/21 1432 98.2 F (36.8 C)     Temp Source 01/14/21 1432 Oral     SpO2 01/14/21 1432 97 %     Weight 01/14/21 1430 130 lb (59 kg)     Height 01/14/21 1430 5\' 9"  (1.753 m)     Head Circumference --      Peak Flow --      Pain Score 01/14/21 1430 0     Pain Loc --      Pain Edu? --      Excl. in [place]? --    Constitutional: Alert and oriented. Well appearing and in no acute distress. Eyes: Conjunctivae are normal. PERRL. Head:  Atraumatic. Nose: No congestion/rhinnorhea. Mouth/Throat: Mucous membranes are moist. Neck: No stridor Cardiovascular: Grossly normal heart sounds.  Good peripheral circulation. Respiratory: Normal respiratory effort.  No retractions. Gastrointestinal: Soft and nontender. No distention. Musculoskeletal: No obvious deformities Neurologic:  Normal speech and language. No gross focal neurologic deficits are appreciated. Skin:  Skin is warm and dry. No rash noted. Psychiatric: Mood and affect are normal. Speech and behavior are normal.  ____________________________________________   LABS (all labs ordered are listed, but only abnormal results are displayed)  Labs Reviewed  BASIC METABOLIC PANEL - Abnormal; Notable for the following components:      Result Value   Glucose, Bld 104 (*)    BUN 24 (*)    All other components within normal limits  URINALYSIS, ROUTINE W REFLEX MICROSCOPIC - Abnormal; Notable for the following components:   Color, Urine BROWN (*)    APPearance CLOUDY (*)    Glucose, UA   (*)  Value: TEST NOT REPORTED DUE TO COLOR INTERFERENCE OF URINE PIGMENT   Hgb urine dipstick   (*)    Value: TEST NOT REPORTED DUE TO COLOR INTERFERENCE OF URINE PIGMENT   Bilirubin Urine   (*)    Value: TEST NOT REPORTED DUE TO COLOR INTERFERENCE OF URINE PIGMENT   Ketones, ur   (*)    Value: TEST NOT REPORTED DUE TO COLOR INTERFERENCE OF URINE PIGMENT   Protein, ur   (*)    Value: TEST NOT REPORTED DUE TO COLOR INTERFERENCE OF URINE PIGMENT   Nitrite   (*)    Value: TEST NOT REPORTED DUE TO COLOR INTERFERENCE OF URINE PIGMENT   Leukocytes,Ua   (*)    Value: TEST NOT REPORTED DUE TO COLOR INTERFERENCE OF URINE PIGMENT   RBC / HPF >50 (*)    Bacteria, UA RARE (*)    All other components within normal limits  TROPONIN I (HIGH SENSITIVITY) - Abnormal; Notable for the following components:   Troponin I (High Sensitivity) 24 (*)    All other components within normal limits  CBC   TROPONIN I (HIGH SENSITIVITY)   ____________________________________________  EKG  ED ECG REPORT I, Naaman Plummer, the attending physician, personally viewed and interpreted this ECG.  Date: 01/14/2021 EKG Time: 1434 Rate: 57 Rhythm: Bradycardic sinus rhythm QRS Axis: normal Intervals: normal ST/T Wave abnormalities: normal Narrative Interpretation: Bradycardic sinus rhythm.  No evidence of acute ischemia  ____________________________________________  RADIOLOGY  ED MD interpretation: 2 view chest x-ray shows no evidence of acute abnormalities including no pneumonia, pneumothorax, or widened mediastinum  Official radiology report(s): DG Chest 2 View  Result Date: 01/14/2021 CLINICAL DATA:  chest discomfort EXAM: CHEST - 2 VIEW COMPARISON:  May 24, 2020 FINDINGS: The cardiomediastinal silhouette is unchanged in contour.Atherosclerotic calcifications no pleural effusion. No pneumothorax. No acute pleuroparenchymal abnormality. Visualized abdomen is unremarkable. Compression fracture deformity status post vertebroplasty at the thoracolumbar junction, unchanged in comparison to prior. IMPRESSION: No acute cardiopulmonary abnormality. Electronically Signed   By: Valentino Saxon M.D.   On: 01/14/2021 15:14    ____________________________________________   PROCEDURES  Procedure(s) performed (including Critical Care):  Procedures   ____________________________________________   INITIAL IMPRESSION / ASSESSMENT AND PLAN / ED COURSE  As part of my medical decision making, I reviewed the following data within the electronic medical record, if available:  Nursing notes reviewed and incorporated, Labs reviewed, EKG interpreted, Old chart reviewed, Radiograph reviewed and Notes from prior ED visits reviewed and incorporated        80 year old male presents with palpitations. EKG: No STEMI and no evidence of Brugadas sign, delta wave, epsilon wave, significantly prolonged  QTc, or malignant arrhythmia. Based on H&P and testing, this patient appears to be low risk for emergent causes of palpitations such as, but not limited to, a malignant cardiac arrhythmia, ACS, pulmonary embolism, thyrotoxicosis, PNA, PTX. Patient also has hematuria likely secondary to his Xarelto use.  Patient told to hold his Xarelto until follow-up with his primary care/cardiologist. The patient has been given strict return precautions and understands the need for further outpatient testing and treatment.  Dispo: Discharge home with PCP follow-up     ____________________________________________   FINAL CLINICAL IMPRESSION(S) / ED DIAGNOSES  Final diagnoses:  Hematuria, unspecified type  Palpitations     ED Discharge Orders     None        Note:  This document was prepared using Dragon voice recognition software and may include unintentional  dictation errors.    Naaman Plummer, MD 01/14/21 [phone]

## 2021-01-14 NOTE — ED Triage Notes (Signed)
Pt reports blood in his urine today some lower back pain and the feeling that his heart was fluttering. Pt denies pain, SOB or nausea.

## 2021-01-15 ENCOUNTER — Telehealth: Payer: Self-pay | Admitting: Internal Medicine

## 2021-01-15 NOTE — Telephone Encounter (Signed)
Pt wife called and stated that patient was seen in ER. Pt would like a call from the provider regarding ER visit. Would like to know if patient needs to be seen in office? Pt would like provider to look at ER visit and make sure everything is okay. ER stopped Blood thinner medication. Pt wife has let Cardiologist know because they are the ones who prescribes that.

## 2021-01-16 ENCOUNTER — Ambulatory Visit: Payer: Medicare Other | Admitting: Internal Medicine

## 2021-01-16 DIAGNOSIS — I4891 Unspecified atrial fibrillation: Secondary | ICD-10-CM | POA: Diagnosis not present

## 2021-01-16 DIAGNOSIS — I2510 Atherosclerotic heart disease of native coronary artery without angina pectoris: Secondary | ICD-10-CM | POA: Diagnosis not present

## 2021-01-16 DIAGNOSIS — I10 Essential (primary) hypertension: Secondary | ICD-10-CM | POA: Diagnosis not present

## 2021-01-16 DIAGNOSIS — I340 Nonrheumatic mitral (valve) insufficiency: Secondary | ICD-10-CM | POA: Diagnosis not present

## 2021-01-16 NOTE — Telephone Encounter (Signed)
Called patient to inquire if patient still needs follow up appointment; he is checking with his wife and will call back.

## 2021-01-17 ENCOUNTER — Telehealth: Payer: Medicare Other | Admitting: Internal Medicine

## 2021-01-19 DIAGNOSIS — E0590 Thyrotoxicosis, unspecified without thyrotoxic crisis or storm: Secondary | ICD-10-CM | POA: Diagnosis not present

## 2021-01-23 ENCOUNTER — Other Ambulatory Visit: Payer: Self-pay | Admitting: Neurology

## 2021-02-01 ENCOUNTER — Other Ambulatory Visit: Payer: Self-pay

## 2021-02-01 ENCOUNTER — Encounter: Payer: Self-pay | Admitting: Urology

## 2021-02-01 ENCOUNTER — Ambulatory Visit: Payer: Medicare Other | Admitting: Urology

## 2021-02-01 VITALS — BP 149/81 | HR 57 | Ht 69.0 in | Wt 130.0 lb

## 2021-02-01 DIAGNOSIS — N200 Calculus of kidney: Secondary | ICD-10-CM | POA: Diagnosis not present

## 2021-02-01 DIAGNOSIS — R310 Gross hematuria: Secondary | ICD-10-CM

## 2021-02-01 NOTE — Progress Notes (Signed)
02/01/2021 3:07 PM   Scott Guzman 01-07-1941 [id number]  Referring provider: [name], MD [name],  [name] [address]  Chief Complaint  Patient presents with   Hematuria   New Patient (Initial Visit)    HPI: Scott Guzman is a 80 y.o. male referred for evaluation of gross hematuria.  [facility] ED visit 01/14/2021 for total gross painless hematuria and palpitations History atrial fibrillation on Xarelto Urine described as red without clots Denies flank, abdominal or pelvic pain No dysuria or bothersome LUTS No prior history urologic problems UA grossly bloody with >50 RBC Xarelto was held and his hematuria resolved without recurrence CT chest/abdomen/pelvis without contrast performed June 2022 for weight loss; noted to have bilateral renal calculi, nonobstructing and measuring up to 6 mm and the left upper pole   PMH: Past Medical History:  Diagnosis Date   Colon cancer ([place]) 2000   He states he had a partial colon resection.    Coronary artery disease    Dysphagia    Dyspnea    Dysrhythmia    Atrial Fibrillation   Hyperlipidemia    Hypertension    PVC (premature ventricular contraction)    Temporal arteritis ([place])    Thyroid disease     Surgical History: Past Surgical History:  Procedure Laterality Date   BACK SURGERY     Kyphoplasty T12  (Nov. 2018)   COLON SURGERY     COLONOSCOPY WITH PROPOFOL N/A 05/05/2017   Procedure: COLONOSCOPY WITH PROPOFOL;  Surgeon: Manya Silvas, MD;  Location: [facility] ENDOSCOPY;  Service: Endoscopy;  Laterality: N/A;   KYPHOPLASTY N/A 01/23/2017   Procedure: [id number];  Surgeon: [name], MD;  Location: [facility] ORS;  Service: Orthopedics;  Laterality: N/A;   LEFT HEART CATH AND CORONARY ANGIOGRAPHY Right 12/03/2018   Procedure: LEFT HEART CATH AND CORONARY ANGIOGRAPHY;  Surgeon: [name], MD;  Location: [place] CV LAB;  Service: Cardiovascular;  Laterality: Right;    Home Medications:  Allergies  as of 02/01/2021   No Known Allergies      Medication List        Accurate as of February 01, 2021  3:07 PM. If you have any questions, ask your nurse or doctor.          albuterol 108 (90 Base) MCG/ACT inhaler Commonly known as: VENTOLIN HFA Inhale 2 puffs into the lungs every 6 (six) hours as needed for wheezing or shortness of breath.   amiodarone 200 MG tablet Commonly known as: PACERONE Take 100 mg by mouth daily.   docusate sodium 50 MG capsule Commonly known as: COLACE Take 1 capsule (50 mg total) by mouth daily as needed for mild constipation.   methimazole 10 MG tablet Commonly known as: TAPAZOLE Take 5 mg by mouth every other day.   mirtazapine 30 MG tablet Commonly known as: REMERON Take 1 tablet (30 mg total) by mouth at bedtime. Please schedule office visit before any future refills.   polyethylene glycol powder 17 GM/SCOOP powder Commonly known as: GLYCOLAX/MIRALAX Take by mouth.   predniSONE 20 MG tablet Commonly known as: DELTASONE Take 20 mg by mouth 2 (two) times daily.   pyridostigmine 60 MG tablet Commonly known as: MESTINON TAKE 1/2 TO 1 TABLET BY MOUTH 3 TIMES A DAY. Call [phone] to schedule follow up for future refills   Rivaroxaban 15 MG Tabs tablet Commonly known as: XARELTO Take 15 mg by mouth daily with supper.   rosuvastatin 20 MG tablet Commonly known  as: CRESTOR Take 20 mg by mouth at bedtime.        Allergies: No Known Allergies  Family History: Family History  Problem Relation Age of Onset   Colon cancer Brother     Social History:  reports that he has never smoked. He has never used smokeless tobacco. He reports current alcohol use of about 4.0 standard drinks per week. He reports that he does not use drugs.   Physical Exam: BP (!) 149/81   Pulse (!) 57   Ht 5\' 9"  (1.753 m)   Wt 130 lb (59 kg)   BMI 19.20 kg/m   Constitutional:  Alert and oriented, No acute distress. HEENT: [place] AT, moist mucus  membranes.  Trachea midline, no masses. Cardiovascular: No clubbing, cyanosis, or edema. Respiratory: Normal respiratory effort, no increased work of breathing. Psychiatric: Normal mood and affect.  Laboratory Data:  Urinalysis 02/01/2021: Dipstick trace blood/trace ketones; microscopy negative   Pertinent Imaging: CT images in June 2022 were personally reviewed and interpreted   Assessment & Plan:    1. Gross hematuria UA today clear UA hematuria stratification: High We discussed the recommend evaluation for high risk hematuria to include CT urogram and cystoscopy.  He had a noncontrast CT abdomen/pelvis June 2022 and will go ahead and schedule cystoscopy to evaluate the lower tract.  If no abnormalities identified on cystoscopy will then schedule CTU  2.  Nephrolithiasis Bilateral, nonobstructing renal calculi   Abbie Sons, MD  [facility] [address] [place], [place] [address] [phone]

## 2021-02-02 LAB — URINALYSIS, COMPLETE
Bilirubin, UA: NEGATIVE
Glucose, UA: NEGATIVE
Leukocytes,UA: NEGATIVE
Nitrite, UA: NEGATIVE
Protein,UA: NEGATIVE
Specific Gravity, UA: 1.025 (ref 1.005–1.030)
Urobilinogen, Ur: 0.2 mg/dL (ref 0.2–1.0)
pH, UA: 5.5 (ref 5.0–7.5)

## 2021-02-02 LAB — MICROSCOPIC EXAMINATION
Bacteria, UA: NONE SEEN
Epithelial Cells (non renal): NONE SEEN /hpf (ref 0–10)

## 2021-02-06 DIAGNOSIS — I4891 Unspecified atrial fibrillation: Secondary | ICD-10-CM | POA: Diagnosis not present

## 2021-02-06 DIAGNOSIS — I10 Essential (primary) hypertension: Secondary | ICD-10-CM | POA: Diagnosis not present

## 2021-02-06 DIAGNOSIS — R0602 Shortness of breath: Secondary | ICD-10-CM | POA: Diagnosis not present

## 2021-02-06 DIAGNOSIS — I2510 Atherosclerotic heart disease of native coronary artery without angina pectoris: Secondary | ICD-10-CM | POA: Diagnosis not present

## 2021-02-18 NOTE — Progress Notes (Signed)
   02/19/21  CC:  Chief Complaint  Patient presents with   Cysto   Indications: Episode total gross painless hematuria 01/14/2021 On Xarelto for atrial fibrillation Noncontrast CT June 2022 with nonobstructing bilateral renal calculi   HPI: No complaints today.  Denies recurrent hematuria.  UA today clear  See rooming tab for vitals NED. A&Ox3.   No respiratory distress   Abd soft, NT, ND Normal phallus with bilateral descended testicles  Cystoscopy Procedure Note  Patient identification was confirmed, informed consent was obtained, and patient was prepped using Betadine solution.  Lidocaine jelly was administered per urethral meatus.     Pre-Procedure: - Inspection reveals a normal caliber urethral meatus.  Procedure: The flexible cystoscope was introduced without difficulty - No urethral strictures/lesions are present. -Mild to moderate prostate enlargement - Normal bladder neck - Bilateral ureteral orifices identified - Bladder mucosa  reveals no ulcers, tumors, or lesions - No bladder stones -Moderate trabeculation  Retroflexion shows no abnormalities   Post-Procedure: - Patient tolerated the procedure well  Assessment/ Plan: Gross hematuria-resolved No lower tract abnormalities on cystoscopy Urine cytology sent Last imaging was a noncontrast CT June 2022 and would recommend scheduling CT urogram     Abbie Sons, MD

## 2021-02-19 ENCOUNTER — Other Ambulatory Visit: Payer: Self-pay

## 2021-02-19 ENCOUNTER — Ambulatory Visit: Payer: Medicare Other | Admitting: Urology

## 2021-02-19 ENCOUNTER — Encounter: Payer: Self-pay | Admitting: Urology

## 2021-02-19 VITALS — BP 120/77 | HR 62 | Ht 70.0 in | Wt 132.0 lb

## 2021-02-19 DIAGNOSIS — R310 Gross hematuria: Secondary | ICD-10-CM

## 2021-02-19 LAB — URINALYSIS, COMPLETE
Bilirubin, UA: NEGATIVE
Glucose, UA: NEGATIVE
Ketones, UA: NEGATIVE
Leukocytes,UA: NEGATIVE
Nitrite, UA: NEGATIVE
Protein,UA: NEGATIVE
RBC, UA: NEGATIVE
Specific Gravity, UA: 1.025 (ref 1.005–1.030)
Urobilinogen, Ur: 0.2 mg/dL (ref 0.2–1.0)
pH, UA: 5.5 (ref 5.0–7.5)

## 2021-02-19 LAB — MICROSCOPIC EXAMINATION: Bacteria, UA: NONE SEEN

## 2021-02-20 LAB — CYTOLOGY - NON PAP

## 2021-02-21 ENCOUNTER — Encounter: Payer: Self-pay | Admitting: *Deleted

## 2021-02-26 DIAGNOSIS — E0590 Thyrotoxicosis, unspecified without thyrotoxic crisis or storm: Secondary | ICD-10-CM | POA: Diagnosis not present

## 2021-03-08 DIAGNOSIS — I2510 Atherosclerotic heart disease of native coronary artery without angina pectoris: Secondary | ICD-10-CM | POA: Diagnosis not present

## 2021-03-08 DIAGNOSIS — E782 Mixed hyperlipidemia: Secondary | ICD-10-CM | POA: Diagnosis not present

## 2021-03-08 DIAGNOSIS — I340 Nonrheumatic mitral (valve) insufficiency: Secondary | ICD-10-CM | POA: Diagnosis not present

## 2021-03-08 DIAGNOSIS — I4891 Unspecified atrial fibrillation: Secondary | ICD-10-CM | POA: Diagnosis not present

## 2021-03-14 DIAGNOSIS — E0590 Thyrotoxicosis, unspecified without thyrotoxic crisis or storm: Secondary | ICD-10-CM | POA: Diagnosis not present

## 2021-03-21 ENCOUNTER — Ambulatory Visit (INDEPENDENT_AMBULATORY_CARE_PROVIDER_SITE_OTHER): Payer: Medicare Other | Admitting: *Deleted

## 2021-03-21 DIAGNOSIS — Z0000 Encounter for general adult medical examination without abnormal findings: Secondary | ICD-10-CM

## 2021-03-21 NOTE — Progress Notes (Signed)
Subjective:   Scott Guzman is a 80 y.o. male who presents for Medicare Annual/Subsequent preventive examination.  I connected with  Delbert Harness on 03/21/21 by a telephone enabled telemedicine application and verified that I am speaking with the correct person using two identifiers.   I discussed the limitations of evaluation and management by telemedicine. The patient expressed understanding and agreed to proceed.  Patient location: home  Provider location:   Tele-Health  not in office    Review of Systems     Cardiac Risk Factors include: advanced age (>60men, >15 women);hypertension;male gender;sedentary lifestyle     Objective:    Today's Vitals   There is no height or weight on file to calculate BMI.  Advanced Directives 03/21/2021 12/28/2020 09/24/2020 05/24/2020 06/16/2019 12/03/2018 05/05/2017  Does Patient Have a Medical Advance Directive? Yes Yes No Yes Yes Yes No  Type of Academic librarian Living will [place];Living will Living will;Healthcare Power of [place];Living will -  Does patient want to make changes to medical advance directive? - - - - - Yes (MAU/Ambulatory/Procedural Areas - Information given) -  Copy of [place] in Chart? Yes - validated most recent copy scanned in chart (See row information) - - - No - copy requested No - copy requested -  Would patient like information on creating a medical advance directive? - - - - - - -    Current Medications (verified) Outpatient Encounter Medications as of 03/21/2021  Medication Sig   albuterol (VENTOLIN HFA) 108 (90 Base) MCG/ACT inhaler Inhale 2 puffs into the lungs every 6 (six) hours as needed for wheezing or shortness of breath.   amiodarone (PACERONE) 200 MG tablet Take 100 mg by mouth daily.   docusate sodium (COLACE) 50 MG capsule Take 1 capsule (50 mg total) by mouth daily as needed for mild constipation.    methimazole (TAPAZOLE) 10 MG tablet Take 5 mg by mouth every other day.   mirtazapine (REMERON) 30 MG tablet Take 1 tablet (30 mg total) by mouth at bedtime. Please schedule office visit before any future refills.   polyethylene glycol powder (GLYCOLAX/MIRALAX) 17 GM/SCOOP powder Take by mouth.   predniSONE (DELTASONE) 20 MG tablet Take 20 mg by mouth 2 (two) times daily.   pyridostigmine (MESTINON) 60 MG tablet TAKE 1/2 TO 1 TABLET BY MOUTH 3 TIMES A DAY. Call [phone] to schedule follow up for future refills   Rivaroxaban (XARELTO) 15 MG TABS tablet Take 15 mg by mouth daily with supper.   rosuvastatin (CRESTOR) 20 MG tablet Take 20 mg by mouth at bedtime.   Facility-Administered Encounter Medications as of 03/21/2021  Medication   sodium chloride flush (NS) 0.9 % injection 3 mL    Allergies (verified) Patient has no known allergies.   History: Past Medical History:  Diagnosis Date   Colon cancer ([place]) 2000   He states he had a partial colon resection.    Coronary artery disease    Dysphagia    Dyspnea    Dysrhythmia    Atrial Fibrillation   Hyperlipidemia    Hypertension    PVC (premature ventricular contraction)    Temporal arteritis ([place])    Thyroid disease    Past Surgical History:  Procedure Laterality Date   BACK SURGERY     Kyphoplasty T12  (Nov. 2018)   COLON SURGERY     COLONOSCOPY WITH PROPOFOL N/A 05/05/2017   Procedure:  COLONOSCOPY WITH PROPOFOL;  Surgeon: Manya Silvas, MD;  [facility] ENDOSCOPY;  Service: Endoscopy;  Laterality: N/A;   KYPHOPLASTY N/A 01/23/2017   Procedure: [id number];  Surgeon: [name], MD;  Location: [facility] ORS;  Service: Orthopedics;  Laterality: N/A;   LEFT HEART CATH AND CORONARY ANGIOGRAPHY Right 12/03/2018   Procedure: LEFT HEART CATH AND CORONARY ANGIOGRAPHY;  Surgeon: [name], MD;  Location: [facility] LAB;  Service: Cardiovascular;  Laterality: Right;   Family History  Problem Relation Age of  Onset   Colon cancer Brother    Social History   Socioeconomic History   Marital status: Married    Spouse name: [name]    Number of children: 2   Years of education: Not on file   Highest education level: Bachelor's degree (e.g., BA, AB, BS)  Occupational History   Occupation: retired    Comment: Charity fundraiser   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   Tobacco Use   Smoking status: Never   Smokeless tobacco: Never  Vaping Use   Vaping Use: Never used  Substance and Sexual Activity   Alcohol use: Yes    Alcohol/week: 4.0 standard drinks    Types: 4 Cans of beer per week   Drug use: No   Sexual activity: Not on file  Other Topics Concern   Not on file  Social History Narrative   1 cup coffee per day   Right handed   Social Determinants of Health   Financial Resource Strain: Low Risk    Difficulty of Paying Living Expenses: Not hard at all  Food Insecurity: No Food Insecurity   Worried About Charity fundraiser in the Last Year: Never true   Ran Out of Food in the Last Year: Never true  Transportation Needs: No Transportation Needs   Lack of Transportation (Medical): No   Lack of Transportation (Non-Medical): No  Physical Activity: Inactive   Days of Exercise per Week: 0 days   Minutes of Exercise per Session: 0 min  Stress: No Stress Concern Present   Feeling of Stress : Not at all  Social Connections: Socially Integrated   Frequency of Communication with Friends and Family: More than three times a week   Frequency of Social Gatherings with Friends and Family: Three times a week   Attends Religious Services: More than 4 times per year   Active Member of Clubs or Organizations: Yes   Attends Music therapist: More than 4 times per year   Marital Status: Married    Tobacco Counseling Counseling given: Not Answered   Clinical Intake:  Pre-visit preparation completed: Yes  Pain : No/denies pain     Nutritional Risks:  None Diabetes: No  How often do you need to have someone help you when you read instructions, pamphlets, or other written materials from your doctor or pharmacy?: 1 - Never  Diabetic?  no  Interpreter Needed?: No  Information entered by :: Leroy Kennedy LPN   Activities of Daily Living In your present state of health, do you have any difficulty performing the following activities: 03/21/2021 09/05/2020  Hearing? N Y  Vision? N Y  Difficulty concentrating or making decisions? N Y  Walking or climbing stairs? N Y  Dressing or bathing? N N  Doing errands, shopping? N N  Preparing Food and eating ? N -  Using the Toilet? N -  In the past six months, have you accidently leaked urine? N -  Do  you have problems with loss of bowel control? N -  Managing your Medications? N -  Managing your Finances? N -  Housekeeping or managing your Housekeeping? N -  Some recent data might be hidden    Patient Care Team: [name], MD as PCP - General Manya Silvas, MD (Inactive) (Gastroenterology)  Indicate any recent Medical Services you may have received from other than [facility] providers in the past year (date may be approximate).     Assessment:   This is a routine wellness examination for [place].  Hearing/Vision screen Hearing Screening - Comments:: No trouble hearing Vision Screening - Comments:: Up to date [facility]  Dietary issues and exercise activities discussed: Current Exercise Habits: The patient does not participate in regular exercise at present   Goals Addressed             This Visit's Progress    Patient Stated       Get back to running       Depression Screen PHQ 2/9 Scores 03/21/2021 10/27/2020 09/29/2020 09/05/2020 03/06/2020 06/16/2019 01/07/2017  PHQ - 2 Score 0 0 0 0 0 0 0  PHQ- 9 Score - - - - 1 - -    Fall Risk Fall Risk  03/21/2021 10/27/2020 09/29/2020 09/13/2020 09/05/2020  Falls in the past year? 0 0 0 0 1  Comment - - - - -  Number falls in past yr: 0 0  0 0 0  Injury with Fall? 0 0 0 0 0  Risk for fall due to : - No Fall Risks Orthopedic patient No Fall Risks Impaired mobility  Follow up Falls prevention discussed;Education provided;Falls evaluation completed Falls evaluation completed Falls evaluation completed Falls evaluation completed Falls evaluation completed    FALL RISK PREVENTION PERTAINING TO THE HOME:  Any stairs in or around the home? Yes  If so, are there any without handrails? No  Home free of loose throw rugs in walkways, pet beds, electrical cords, etc? Yes  Adequate lighting in your home to reduce risk of falls? Yes   ASSISTIVE DEVICES UTILIZED TO PREVENT FALLS:  Life alert? No  Use of a cane, walker or w/c? No  Grab bars in the bathroom? No  Shower chair or bench in shower? Yes  Elevated toilet seat or a handicapped toilet? No   TIMED UP AND GO:  Was the test performed? No .    Cognitive Function:  Normal cognitive status assessed by direct observation by this Nurse Health Advisor. No abnormalities found.     Montreal Cognitive Assessment  08/01/2020  Visuospatial/ Executive (0/5) 1  Naming (0/3) 3  Attention: Read list of digits (0/2) 2  Attention: Read list of letters (0/1) 1  Attention: Serial 7 subtraction starting at 100 (0/3) 2  Language: Repeat phrase (0/2) 0  Language : Fluency (0/1) 0  Abstraction (0/2) 1  Delayed Recall (0/5) 0  Orientation (0/6) 4  Total 14  Adjusted Score (based on education) 14   6CIT Screen 06/16/2019  What Year? 0 points  What month? 0 points  What time? 0 points  Count back from 20 0 points  Months in reverse 0 points  Repeat phrase 2 points  Total Score 2    Immunizations Immunization History  Administered Date(s) Administered   Fluad Quad(high Dose 65+) 12/30/2018   Influenza, High Dose Seasonal PF 01/17/2017, 12/24/2017   Influenza,inj,Quad PF,6+ Mos 04/25/2015   Influenza-Unspecified 04/25/2015, 01/17/2017   PFIZER(Purple Top)SARS-COV-2 Vaccination  04/06/2019, 05/07/2019  Pneumococcal Conjugate-13 04/05/2014, 12/24/2017   Pneumococcal Polysaccharide-23 02/24/2006, 11/01/2015   Td 02/24/2006   Zoster, Live 12/21/2007    TDAP status: Due, Education has been provided regarding the importance of this vaccine. Advised may receive this vaccine at local pharmacy or Health Dept. Aware to provide a copy of the vaccination record if obtained from local pharmacy or Health Dept. Verbalized acceptance and understanding.  Flu Vaccine status: Due, Education has been provided regarding the importance of this vaccine. Advised may receive this vaccine at local pharmacy or Health Dept. Aware to provide a copy of the vaccination record if obtained from local pharmacy or Health Dept. Verbalized acceptance and understanding.  Pneumococcal vaccine status: Up to date  Covid-19 vaccine status: Information provided on how to obtain vaccines.   Qualifies for Shingles Vaccine? Yes   Zostavax completed Yes   Shingrix Completed?: No.    Education has been provided regarding the importance of this vaccine. Patient has been advised to call insurance company to determine out of pocket expense if they have not yet received this vaccine. Advised may also receive vaccine at local pharmacy or Health Dept. Verbalized acceptance and understanding.  Screening Tests Health Maintenance  Topic Date Due   Zoster Vaccines- Shingrix (1 of 2) Never done   COVID-19 Vaccine (3 - Booster for Pfizer series) 07/02/2019   INFLUENZA VACCINE  06/22/2021 (Originally 10/23/2020)   TETANUS/TDAP  09/29/2021 (Originally 02/25/2016)   COLONOSCOPY (Pts 45-55yrs Insurance coverage will need to be confirmed)  05/05/2022   Pneumonia Vaccine 59+ Years old  Completed   Hepatitis C Screening  Completed   HPV VACCINES  Aged Out    Health Maintenance  Health Maintenance Due  Topic Date Due   Zoster Vaccines- Shingrix (1 of 2) Never done   COVID-19 Vaccine (3 - Booster for Pfizer series)  07/02/2019    Colorectal cancer screening: Type of screening: Colonoscopy. Completed 2019. Repeat every 5 years  Lung Cancer Screening: (Low Dose CT Chest recommended if Age 70-80 years, 30 pack-year currently smoking OR have quit w/in 15years.) does not qualify.   Lung Cancer Screening Referral:   Additional Screening:  Hepatitis C Screening: does not qualify;   Vision Screening: Recommended annual ophthalmology exams for early detection of glaucoma and other disorders of the eye. Is the patient up to date with their annual eye exam?  Yes  Who is the provider or what is the name of the office in which the patient attends annual eye exams? [facility] If pt is not established with a provider, would they like to be referred to a provider to establish care? No .   Dental Screening: Recommended annual dental exams for proper oral hygiene  Community Resource Referral / Chronic Care Management: CRR required this visit?  No   CCM required this visit?  No      Plan:     I have personally reviewed and noted the following in the patients chart:   Medical and social history Use of alcohol, tobacco or illicit drugs  Current medications and supplements including opioid prescriptions. Patient is not currently taking opioid prescriptions. Functional ability and status Nutritional status Physical activity Advanced directives List of other physicians Hospitalizations, surgeries, and ER visits in previous 12 months Vitals Screenings to include cognitive, depression, and falls Referrals and appointments  In addition, I have reviewed and discussed with patient certain preventive protocols, quality metrics, and best practice recommendations. A written personalized care plan for preventive services as well as general preventive  health recommendations were provided to patient.     Leroy Kennedy, LPN   [id number]   Nurse Notes:

## 2021-03-21 NOTE — Patient Instructions (Signed)
Scott Guzman , Thank you for taking time to come for your Medicare Wellness Visit. I appreciate your ongoing commitment to your health goals. Please review the following plan we discussed and let me know if I can assist you in the future.   Screening recommendations/referrals: Colonoscopy: up to date Recommended yearly ophthalmology/optometry visit for glaucoma screening and checkup Recommended yearly dental visit for hygiene and checkup  Vaccinations: Influenza vaccine: Education provided Pneumococcal vaccine: up to date Tdap vaccine: Education provided Shingles vaccine: Education provided    Advanced directives: yes on file  Conditions/risks identified:    Preventive Care 27 Years and Older, Male Preventive care refers to lifestyle choices and visits with your health care provider that can promote health and wellness. What does preventive care include? A yearly physical exam. This is also called an annual well check. Dental exams once or twice a year. Routine eye exams. Ask your health care provider how often you should have your eyes checked. Personal lifestyle choices, including: Daily care of your teeth and gums. Regular physical activity. Eating a healthy diet. Avoiding tobacco and drug use. Limiting alcohol use. Practicing safe sex. Taking low doses of aspirin every day. Taking vitamin and mineral supplements as recommended by your health care provider. What happens during an annual well check? The services and screenings done by your health care provider during your annual well check will depend on your age, overall health, lifestyle risk factors, and family history of disease. Counseling  Your health care provider may ask you questions about your: Alcohol use. Tobacco use. Drug use. Emotional well-being. Home and relationship well-being. Sexual activity. Eating habits. History of falls. Memory and ability to understand (cognition). Work and work  Statistician. Screening  You may have the following tests or measurements: Height, weight, and BMI. Blood pressure. Lipid and cholesterol levels. These may be checked every 5 years, or more frequently if you are over 63 years old. Skin check. Lung cancer screening. You may have this screening every year starting at age 9 if you have a 30-pack-year history of smoking and currently smoke or have quit within the past 15 years. Fecal occult blood test (FOBT) of the stool. You may have this test every year starting at age 53. Flexible sigmoidoscopy or colonoscopy. You may have a sigmoidoscopy every 5 years or a colonoscopy every 10 years starting at age 59. Prostate cancer screening. Recommendations will vary depending on your family history and other risks. Hepatitis C blood test. Hepatitis B blood test. Sexually transmitted disease (STD) testing. Diabetes screening. This is done by checking your blood sugar (glucose) after you have not eaten for a while (fasting). You may have this done every 1-3 years. Abdominal aortic aneurysm (AAA) screening. You may need this if you are a current or former smoker. Osteoporosis. You may be screened starting at age 62 if you are at high risk. Talk with your health care provider about your test results, treatment options, and if necessary, the need for more tests. Vaccines  Your health care provider may recommend certain vaccines, such as: Influenza vaccine. This is recommended every year. Tetanus, diphtheria, and acellular pertussis (Tdap, Td) vaccine. You may need a Td booster every 10 years. Zoster vaccine. You may need this after age 17. Pneumococcal 13-valent conjugate (PCV13) vaccine. One dose is recommended after age 51. Pneumococcal polysaccharide (PPSV23) vaccine. One dose is recommended after age 50. Talk to your health care provider about which screenings and vaccines you need and how often you need  them. This information is not intended to replace  advice given to you by your health care provider. Make sure you discuss any questions you have with your health care provider. Document Released: 04/07/2015 Document Revised: 11/29/2015 Document Reviewed: 01/10/2015 Elsevier Interactive Patient Education  2017 [place] Prevention in the Home Falls can cause injuries. They can happen to people of all ages. There are many things you can do to make your home safe and to help prevent falls. What can I do on the outside of my home? Regularly fix the edges of walkways and driveways and fix any cracks. Remove anything that might make you trip as you walk through a door, such as a raised step or threshold. Trim any bushes or trees on the path to your home. Use bright outdoor lighting. Clear any walking paths of anything that might make someone trip, such as rocks or tools. Regularly check to see if handrails are loose or broken. Make sure that both sides of any steps have handrails. Any raised decks and porches should have guardrails on the edges. Have any leaves, snow, or ice cleared regularly. Use sand or salt on walking paths during winter. Clean up any spills in your garage right away. This includes oil or grease spills. What can I do in the bathroom? Use night lights. Install grab bars by the toilet and in the tub and shower. Do not use towel bars as grab bars. Use non-skid mats or decals in the tub or shower. If you need to sit down in the shower, use a plastic, non-slip stool. Keep the floor dry. Clean up any water that spills on the floor as soon as it happens. Remove soap buildup in the tub or shower regularly. Attach bath mats securely with double-sided non-slip rug tape. Do not have throw rugs and other things on the floor that can make you trip. What can I do in the bedroom? Use night lights. Make sure that you have a light by your bed that is easy to reach. Do not use any sheets or blankets that are too big for your bed.  They should not hang down onto the floor. Have a firm chair that has side arms. You can use this for support while you get dressed. Do not have throw rugs and other things on the floor that can make you trip. What can I do in the kitchen? Clean up any spills right away. Avoid walking on wet floors. Keep items that you use a lot in easy-to-reach places. If you need to reach something above you, use a strong step stool that has a grab bar. Keep electrical cords out of the way. Do not use floor polish or wax that makes floors slippery. If you must use wax, use non-skid floor wax. Do not have throw rugs and other things on the floor that can make you trip. What can I do with my stairs? Do not leave any items on the stairs. Make sure that there are handrails on both sides of the stairs and use them. Fix handrails that are broken or loose. Make sure that handrails are as long as the stairways. Check any carpeting to make sure that it is firmly attached to the stairs. Fix any carpet that is loose or worn. Avoid having throw rugs at the top or bottom of the stairs. If you do have throw rugs, attach them to the floor with carpet tape. Make sure that you have a light switch at  the top of the stairs and the bottom of the stairs. If you do not have them, ask someone to add them for you. What else can I do to help prevent falls? Wear shoes that: Do not have high heels. Have rubber bottoms. Are comfortable and fit you well. Are closed at the toe. Do not wear sandals. If you use a stepladder: Make sure that it is fully opened. Do not climb a closed stepladder. Make sure that both sides of the stepladder are locked into place. Ask someone to hold it for you, if possible. Clearly mark and make sure that you can see: Any grab bars or handrails. First and last steps. Where the edge of each step is. Use tools that help you move around (mobility aids) if they are needed. These  include: Canes. Walkers. Scooters. Crutches. Turn on the lights when you go into a dark area. Replace any light bulbs as soon as they burn out. Set up your furniture so you have a clear path. Avoid moving your furniture around. If any of your floors are uneven, fix them. If there are any pets around you, be aware of where they are. Review your medicines with your doctor. Some medicines can make you feel dizzy. This can increase your chance of falling. Ask your doctor what other things that you can do to help prevent falls. This information is not intended to replace advice given to you by your health care provider. Make sure you discuss any questions you have with your health care provider. Document Released: 01/05/2009 Document Revised: 08/17/2015 Document Reviewed: 04/15/2014 Elsevier Interactive Patient Education  2017 [employer].

## 2021-03-28 ENCOUNTER — Other Ambulatory Visit: Payer: Self-pay

## 2021-03-28 ENCOUNTER — Ambulatory Visit (INDEPENDENT_AMBULATORY_CARE_PROVIDER_SITE_OTHER): Payer: Medicare Other | Admitting: Internal Medicine

## 2021-03-28 ENCOUNTER — Encounter: Payer: Self-pay | Admitting: Internal Medicine

## 2021-03-28 VITALS — BP 100/64 | HR 103 | Temp 98.2°F | Ht 68.9 in | Wt 131.8 lb

## 2021-03-28 DIAGNOSIS — M316 Other giant cell arteritis: Secondary | ICD-10-CM | POA: Diagnosis not present

## 2021-03-28 DIAGNOSIS — D649 Anemia, unspecified: Secondary | ICD-10-CM

## 2021-03-28 DIAGNOSIS — I4891 Unspecified atrial fibrillation: Secondary | ICD-10-CM | POA: Diagnosis not present

## 2021-03-28 DIAGNOSIS — I700 Atherosclerosis of aorta: Secondary | ICD-10-CM

## 2021-03-28 DIAGNOSIS — E0590 Thyrotoxicosis, unspecified without thyrotoxic crisis or storm: Secondary | ICD-10-CM

## 2021-03-28 DIAGNOSIS — D6869 Other thrombophilia: Secondary | ICD-10-CM | POA: Diagnosis not present

## 2021-03-28 DIAGNOSIS — R5383 Other fatigue: Secondary | ICD-10-CM | POA: Diagnosis not present

## 2021-03-28 DIAGNOSIS — I10 Essential (primary) hypertension: Secondary | ICD-10-CM

## 2021-03-28 NOTE — Progress Notes (Signed)
BP 100/64    Pulse (!) 103    Temp 98.2 F (36.8 C) (Oral)    Ht 5' 8.9" (1.75 m)    Wt 131 lb 12.8 oz (59.8 kg)    SpO2 97%    BMI 19.52 kg/m    Subjective:    Patient ID: Scott Guzman, male    DOB: Oct 22, 1940, 81 y.o.   MRN: [id number]  Chief Complaint  Patient presents with   Hyperthyroidism    HPI: Scott Guzman is a 81 y.o. male  Pt is here for a fu, sees Scott Guzman for hyperthyroidism is on tapazole on M and F 5 mg per pt and his wife.   Was in the ER for hematuria and palpitations which resolved after an 8 hour wait period in the ER>    Heart Problem This is a chronic (is on amiodarone for such and xarelto) problem. Associated symptoms include fatigue. Pertinent negatives include no abdominal pain, arthralgias, chest pain, chills, congestion, coughing, diaphoresis, fever, headaches, joint swelling, myalgias, nausea, numbness, rash, visual change, vomiting or weakness.  Thyroid Problem Presents for follow-up visit. Symptoms include dry skin, fatigue and weight gain. Patient reports no cold intolerance, constipation, depressed mood, diaphoresis, diarrhea, hair loss, heat intolerance, menstrual problem, nail problem, palpitations, tremors, visual change or weight loss.   Chief Complaint  Patient presents with   Hyperthyroidism    Relevant past medical, surgical, family and social history reviewed and updated as indicated. Interim medical history since our last visit reviewed. Allergies and medications reviewed and updated.  Review of Systems  Constitutional:  Positive for fatigue and weight gain. Negative for activity change, appetite change, chills, diaphoresis, fever and weight loss.  HENT:  Negative for congestion, ear discharge, ear pain and facial swelling.   Eyes:  Negative for pain, discharge and itching.  Respiratory:  Negative for cough, chest tightness, shortness of breath and wheezing.   Cardiovascular:  Negative for chest pain, palpitations and leg swelling.   Gastrointestinal:  Negative for abdominal distention, abdominal pain, blood in stool, constipation, diarrhea, nausea and vomiting.  Endocrine: Negative for cold intolerance, heat intolerance, polydipsia, polyphagia and polyuria.  Genitourinary:  Negative for difficulty urinating, dysuria, flank pain, frequency, hematuria, menstrual problem and urgency.  Musculoskeletal:  Negative for arthralgias, gait problem, joint swelling and myalgias.  Skin:  Negative for color change, rash and wound.  Neurological:  Negative for dizziness, tremors, speech difficulty, weakness, light-headedness, numbness and headaches.  Hematological:  Does not bruise/bleed easily.  Psychiatric/Behavioral:  Negative for agitation, confusion, decreased concentration, sleep disturbance and suicidal ideas.    Per HPI unless specifically indicated above     Objective:    BP 100/64    Pulse (!) 103    Temp 98.2 F (36.8 C) (Oral)    Ht 5' 8.9" (1.75 m)    Wt 131 lb 12.8 oz (59.8 kg)    SpO2 97%    BMI 19.52 kg/m   Wt Readings from Last 3 Encounters:  03/28/21 131 lb 12.8 oz (59.8 kg)  02/19/21 132 lb (59.9 kg)  02/01/21 130 lb (59 kg)    Physical Exam Vitals and nursing note reviewed.  Constitutional:      General: He is not in acute distress.    Appearance: Normal appearance. He is not ill-appearing or diaphoretic.  HENT:     Head: Normocephalic and atraumatic.     Right Ear: Tympanic membrane and external ear normal. There is no impacted cerumen.  Left Ear: External ear normal.     Nose: No congestion or rhinorrhea.     Mouth/Throat:     Pharynx: No oropharyngeal exudate or posterior oropharyngeal erythema.  Eyes:     Conjunctiva/sclera: Conjunctivae normal.     Pupils: Pupils are equal, round, and reactive to light.  Cardiovascular:     Rate and Rhythm: Normal rate and regular rhythm.     Heart sounds: No murmur heard.   No friction rub. No gallop.  Pulmonary:     Effort: No respiratory distress.      Breath sounds: No stridor. No wheezing or rhonchi.  Chest:     Chest wall: No tenderness.  Abdominal:     General: Abdomen is flat. Bowel sounds are normal.     Palpations: Abdomen is soft. There is no mass.     Tenderness: There is no abdominal tenderness.  Musculoskeletal:     Cervical back: Normal range of motion and neck supple. No rigidity or tenderness.     Left lower leg: No edema.  Skin:    General: Skin is warm and dry.  Neurological:     General: No focal deficit present.     Mental Status: He is alert.    Results for orders placed or performed in visit on 02/19/21  Microscopic Examination   Urine  Result Value Ref Range   WBC, UA 0-5 0 - 5 /hpf   RBC 0-2 0 - 2 /hpf   Epithelial Cells (non renal) 0-10 0 - 10 /hpf   Bacteria, UA None seen None seen/Few  Urinalysis, Complete  Result Value Ref Range   Specific Gravity, UA 1.025 1.005 - 1.030   pH, UA 5.5 5.0 - 7.5   Color, UA Yellow Yellow   Appearance Ur Clear Clear   Leukocytes,UA Negative Negative   Protein,UA Negative Negative/Trace   Glucose, UA Negative Negative   Ketones, UA Negative Negative   RBC, UA Negative Negative   Bilirubin, UA Negative Negative   Urobilinogen, Ur 0.2 0.2 - 1.0 mg/dL   Nitrite, UA Negative Negative   Microscopic Examination See below:   Cytology - Non PAP;  Result Value Ref Range   CYTOLOGY - NON GYN      CYTOLOGY - NON PAP CASE: [id number] PATIENT: Scott Guzman Non-Gynecological Cytology Report     Specimen Submitted: A. Urine  Clinical History: Gross hematuria. Finding: No lower tract abnormalities on cystoscopy.     DIAGNOSIS: A. URINE: - ADEQUACY: ADEQUATE FOR INTERPRETATION. - NEGATIVE FOR HIGH-GRADE UROTHELIAL CARCINOMA.  GROSS DESCRIPTION: A. Labeled: Received labeled with the patient's name and date of birth (per requisition urine, voided) Received: Fresh Volume: Approximately 45 mL Description of fluid and container in which it is received: Received  in a clear plastic specimen container with a yellow screw top lid is yellow transparent fluid. Specimen material submitted for: ThinPrep  RB 02/19/2021  Final Diagnosis performed by Scott Lemma, MD.   Electronically signed 02/20/2021 2:08:37PM The electronic signature indicates that the named Attending Pathologist has evaluated the specimen Technical component performed at [facility], [address] [place], [place], [place] [address] Lab: [id number] Dir: Rush Farmer, MD, MMM  Professional component performed at [facility], [facility], [place], [place], [place] [address] Lab: [phone] Dir: Kathi Simpers, MD         Current Outpatient Medications:    albuterol (VENTOLIN HFA) 108 (90 Base) MCG/ACT inhaler, Inhale 2 puffs into the lungs every 6 (six) hours as needed  for wheezing or shortness of breath., Disp: 8 g, Rfl: 2   amiodarone (PACERONE) 200 MG tablet, Take 100 mg by mouth daily., Disp: , Rfl:    docusate sodium (COLACE) 50 MG capsule, Take 1 capsule (50 mg total) by mouth daily as needed for mild constipation., Disp: 30 capsule, Rfl: 2   methimazole (TAPAZOLE) 10 MG tablet, Take 5 mg by mouth every other day., Disp: , Rfl:    mirtazapine (REMERON) 30 MG tablet, Take 1 tablet (30 mg total) by mouth at bedtime. Please schedule office visit before any future refills., Disp: 90 tablet, Rfl: 4   polyethylene glycol powder (GLYCOLAX/MIRALAX) 17 GM/SCOOP powder, Take by mouth., Disp: , Rfl:    predniSONE (DELTASONE) 20 MG tablet, Take 20 mg by mouth 2 (two) times daily., Disp: , Rfl:    pyridostigmine (MESTINON) 60 MG tablet, TAKE 1/2 TO 1 TABLET BY MOUTH 3 TIMES A DAY. Call [phone] to schedule follow up for future refills, Disp: 270 tablet, Rfl: 0   Rivaroxaban (XARELTO) 15 MG TABS tablet, Take 15 mg by mouth daily with supper., Disp: , Rfl:    rosuvastatin (CRESTOR) 20 MG tablet, Take 20 mg by mouth at bedtime., Disp: , Rfl:  No current  facility-administered medications for this visit.  Facility-Administered Medications Ordered in Other Visits:    sodium chloride flush (NS) 0.9 % injection 3 mL, 3 mL, Intravenous, Q12H, [name], MD    Assessment & Plan:  Afib :Is on xarelto for such  Sees Dr. Humphrey Rolls for A. fib no notes available from their office either will obtain such Heart rate stable is on amiodarone for such Follow-up and management per cardiology  Hypothyroidism is on Tapazole twice a week per patient's wife sees Scott Guzman endocrinology at [facility] endocrinology for such. No notes available since last visit will obtain such via fax from their office. Follow-up and management per endocrinology  Hematuria xarelto was stopped  And restarted.  Has had a Cystoscopy and a CT in June to have a Ct urogram   Fatigue ? Sec to hyperthyroidism : Check labs today   Problem List Items Addressed This Visit       Endocrine   Hyperthyroidism - Primary   Relevant Orders   TSH   T4, free   CBC with Differential/Platelet   Hepatic function panel     Orders Placed This Encounter  Procedures   TSH   T4, free   CBC with Differential/Platelet   Hepatic function panel     No orders of the defined types were placed in this encounter.    Follow up plan: No follow-ups on file.

## 2021-03-29 ENCOUNTER — Ambulatory Visit
Admission: RE | Admit: 2021-03-29 | Discharge: 2021-03-29 | Disposition: A | Discharge status: Home / Self Care | Payer: Medicare Other | Source: Ambulatory Visit | Attending: Urology | Admitting: Urology

## 2021-03-29 DIAGNOSIS — R310 Gross hematuria: Secondary | ICD-10-CM | POA: Insufficient documentation

## 2021-03-29 DIAGNOSIS — M4306 Spondylolysis, lumbar region: Secondary | ICD-10-CM | POA: Diagnosis not present

## 2021-03-29 DIAGNOSIS — M4854XA Collapsed vertebra, not elsewhere classified, thoracic region, initial encounter for fracture: Secondary | ICD-10-CM | POA: Diagnosis not present

## 2021-03-29 DIAGNOSIS — D649 Anemia, unspecified: Secondary | ICD-10-CM | POA: Insufficient documentation

## 2021-03-29 DIAGNOSIS — N3289 Other specified disorders of bladder: Secondary | ICD-10-CM | POA: Diagnosis not present

## 2021-03-29 DIAGNOSIS — R5383 Other fatigue: Secondary | ICD-10-CM | POA: Insufficient documentation

## 2021-03-29 DIAGNOSIS — N132 Hydronephrosis with renal and ureteral calculous obstruction: Secondary | ICD-10-CM | POA: Diagnosis not present

## 2021-03-29 LAB — CBC WITH DIFFERENTIAL/PLATELET
Basophils Absolute: 0.1 10*3/uL (ref 0.0–0.2)
Basos: 1 %
EOS (ABSOLUTE): 0.2 10*3/uL (ref 0.0–0.4)
Eos: 5 %
Hematocrit: 39.7 % (ref 37.5–51.0)
Hemoglobin: 12.8 g/dL — ABNORMAL LOW (ref 13.0–17.7)
Immature Grans (Abs): 0 10*3/uL (ref 0.0–0.1)
Immature Granulocytes: 0 %
Lymphocytes Absolute: 1.1 10*3/uL (ref 0.7–3.1)
Lymphs: 23 %
MCH: 29.3 pg (ref 26.6–33.0)
MCHC: 32.2 g/dL (ref 31.5–35.7)
MCV: 91 fL (ref 79–97)
Monocytes Absolute: 0.4 10*3/uL (ref 0.1–0.9)
Monocytes: 9 %
Neutrophils Absolute: 2.9 10*3/uL (ref 1.4–7.0)
Neutrophils: 62 %
Platelets: 210 10*3/uL (ref 150–450)
RBC: 4.37 x10E6/uL (ref 4.14–5.80)
RDW: 13.7 % (ref 11.6–15.4)
WBC: 4.7 10*3/uL (ref 3.4–10.8)

## 2021-03-29 LAB — HEPATIC FUNCTION PANEL
ALT: 13 IU/L (ref 0–44)
AST: 24 IU/L (ref 0–40)
Albumin: 4 g/dL (ref 3.7–4.7)
Alkaline Phosphatase: 72 IU/L (ref 44–121)
Bilirubin Total: 0.3 mg/dL (ref 0.0–1.2)
Bilirubin, Direct: 0.11 mg/dL (ref 0.00–0.40)
Total Protein: 5.7 g/dL — ABNORMAL LOW (ref 6.0–8.5)

## 2021-03-29 LAB — HEMOGLOBIN A1C
Est. average glucose Bld gHb Est-mCnc: 126 mg/dL
Hgb A1c MFr Bld: 6 % — ABNORMAL HIGH (ref 4.8–5.6)

## 2021-03-29 LAB — POCT I-STAT CREATININE: Creatinine, Ser: 1.4 mg/dL — ABNORMAL HIGH (ref 0.61–1.24)

## 2021-03-29 LAB — TSH: TSH: 6.77 u[IU]/mL — ABNORMAL HIGH (ref 0.450–4.500)

## 2021-03-29 LAB — T4, FREE: Free T4: 1.13 ng/dL (ref 0.82–1.77)

## 2021-03-29 MED ORDER — IOHEXOL 300 MG/ML  SOLN
100.0000 mL | Freq: Once | INTRAMUSCULAR | Status: AC | PRN
  Administered 2021-03-29: 100 mL via INTRAVENOUS

## 2021-03-29 NOTE — Addendum Note (Signed)
Addended byCharlynne Cousins on: 03/29/2021 09:50 AM   Modules accepted: Orders

## 2021-03-29 NOTE — Progress Notes (Signed)
Will need to check anemia w/u can we try and add on please.

## 2021-03-30 LAB — FOLATE: Folate: 5.6 ng/mL (ref >3.0)

## 2021-03-30 LAB — IRON AND TIBC
Iron Saturation: 28 % (ref 15–55)
Iron: 73 ug/dL (ref 38–169)
Total Iron Binding Capacity: 260 ug/dL (ref 250–450)
UIBC: 187 ug/dL (ref 111–343)

## 2021-03-30 LAB — VITAMIN B12: Vitamin B-12: 369 pg/mL (ref 232–1245)

## 2021-03-30 LAB — SPECIMEN STATUS REPORT

## 2021-03-30 LAB — FERRITIN: Ferritin: 56 ng/mL (ref 30–400)

## 2021-04-02 ENCOUNTER — Telehealth: Payer: Self-pay | Admitting: *Deleted

## 2021-04-02 ENCOUNTER — Encounter: Payer: Self-pay | Admitting: *Deleted

## 2021-04-02 DIAGNOSIS — Z961 Presence of intraocular lens: Secondary | ICD-10-CM | POA: Diagnosis not present

## 2021-04-02 DIAGNOSIS — H5203 Hypermetropia, bilateral: Secondary | ICD-10-CM | POA: Diagnosis not present

## 2021-04-02 DIAGNOSIS — N200 Calculus of kidney: Secondary | ICD-10-CM

## 2021-04-02 DIAGNOSIS — H04123 Dry eye syndrome of bilateral lacrimal glands: Secondary | ICD-10-CM | POA: Diagnosis not present

## 2021-04-02 DIAGNOSIS — H26493 Other secondary cataract, bilateral: Secondary | ICD-10-CM | POA: Diagnosis not present

## 2021-04-02 MED ORDER — TAMSULOSIN HCL 0.4 MG PO CAPS: 0.4000 mg | ORAL_CAPSULE | Freq: Every day | ORAL | 0 refills | Status: DC

## 2021-04-02 NOTE — Telephone Encounter (Signed)
Notified patient as instructed, patient would like to get a kub and tamsulosin sent to [facility].

## 2021-04-02 NOTE — Telephone Encounter (Signed)
-----   Message from Abbie Sons, MD sent at 04/02/2021  7:39 AM EST ----- CT does show a 4 mm calculus in the left ureter with mild kidney blockage.  This is the most likely cause of his hematuria.  This is a stone small enough to pass.  If he desires a trial of passage would send in Rx tamsulosin to pharmacy and obtain a KUB to see if it is visualized on plain x-ray.  Other options include ureteroscopic removal and shockwave lithotripsy.  Please let me know if he has any questions.  If he desires a trial of passage would get a KUB this week and a follow-up KUB in 2 weeks

## 2021-04-03 ENCOUNTER — Ambulatory Visit
Admission: RE | Admit: 2021-04-03 | Discharge: 2021-04-03 | Disposition: A | Discharge status: Home / Self Care | Payer: Medicare Other | Attending: Urology | Admitting: Urology

## 2021-04-03 ENCOUNTER — Ambulatory Visit
Admission: RE | Admit: 2021-04-03 | Discharge: 2021-04-03 | Disposition: A | Discharge status: Home / Self Care | Payer: Medicare Other | Source: Ambulatory Visit | Attending: Urology | Admitting: Urology

## 2021-04-03 DIAGNOSIS — N200 Calculus of kidney: Secondary | ICD-10-CM | POA: Insufficient documentation

## 2021-04-03 DIAGNOSIS — R109 Unspecified abdominal pain: Secondary | ICD-10-CM | POA: Diagnosis not present

## 2021-04-03 DIAGNOSIS — K5900 Constipation, unspecified: Secondary | ICD-10-CM | POA: Diagnosis not present

## 2021-04-06 ENCOUNTER — Encounter: Payer: Self-pay | Admitting: *Deleted

## 2021-04-09 DIAGNOSIS — I4891 Unspecified atrial fibrillation: Secondary | ICD-10-CM | POA: Diagnosis not present

## 2021-04-09 DIAGNOSIS — R0602 Shortness of breath: Secondary | ICD-10-CM | POA: Diagnosis not present

## 2021-04-09 DIAGNOSIS — E782 Mixed hyperlipidemia: Secondary | ICD-10-CM | POA: Diagnosis not present

## 2021-04-09 DIAGNOSIS — I2510 Atherosclerotic heart disease of native coronary artery without angina pectoris: Secondary | ICD-10-CM | POA: Diagnosis not present

## 2021-04-09 DIAGNOSIS — I340 Nonrheumatic mitral (valve) insufficiency: Secondary | ICD-10-CM | POA: Diagnosis not present

## 2021-04-09 DIAGNOSIS — I10 Essential (primary) hypertension: Secondary | ICD-10-CM | POA: Diagnosis not present

## 2021-04-25 ENCOUNTER — Other Ambulatory Visit: Payer: Self-pay | Admitting: Urology

## 2021-05-18 DIAGNOSIS — E0590 Thyrotoxicosis, unspecified without thyrotoxic crisis or storm: Secondary | ICD-10-CM | POA: Diagnosis not present

## 2021-06-04 ENCOUNTER — Telehealth: Payer: Self-pay | Admitting: *Deleted

## 2021-06-04 ENCOUNTER — Other Ambulatory Visit: Payer: Self-pay | Admitting: *Deleted

## 2021-06-04 DIAGNOSIS — N200 Calculus of kidney: Secondary | ICD-10-CM

## 2021-06-04 NOTE — Telephone Encounter (Signed)
-----   Message from Abbie Sons, MD sent at 06/03/2021  9:57 AM EDT ----- ?Regarding: KUB ?Was due for a follow-up KUB last month.  Please order and contact patient. ? ?

## 2021-06-04 NOTE — Telephone Encounter (Signed)
Patient states he is not going to get another kub done. Patient wife and himself are not happy with the [facility] system .  ?

## 2021-06-04 NOTE — Telephone Encounter (Signed)
If he has not already would recommend he establish care with another urology practice to make sure the ureteral stone is gone.  If the ureteral calculus is still present it could cause continued kidney blockage and loss of kidney function. ?

## 2021-06-15 DIAGNOSIS — I10 Essential (primary) hypertension: Secondary | ICD-10-CM | POA: Diagnosis not present

## 2021-06-15 DIAGNOSIS — I340 Nonrheumatic mitral (valve) insufficiency: Secondary | ICD-10-CM | POA: Diagnosis not present

## 2021-06-15 DIAGNOSIS — E782 Mixed hyperlipidemia: Secondary | ICD-10-CM | POA: Diagnosis not present

## 2021-06-15 DIAGNOSIS — I4891 Unspecified atrial fibrillation: Secondary | ICD-10-CM | POA: Diagnosis not present

## 2021-06-15 DIAGNOSIS — I2510 Atherosclerotic heart disease of native coronary artery without angina pectoris: Secondary | ICD-10-CM | POA: Diagnosis not present

## 2021-06-15 DIAGNOSIS — R079 Chest pain, unspecified: Secondary | ICD-10-CM | POA: Diagnosis not present

## 2021-06-21 ENCOUNTER — Other Ambulatory Visit: Payer: Self-pay

## 2021-06-21 ENCOUNTER — Emergency Department
Admission: EM | Admit: 2021-06-21 | Discharge: 2021-06-21 | Disposition: A | Discharge status: Home / Self Care | Payer: Medicare Other | Attending: Emergency Medicine | Admitting: Emergency Medicine

## 2021-06-21 ENCOUNTER — Emergency Department: Payer: Medicare Other

## 2021-06-21 ENCOUNTER — Ambulatory Visit (INDEPENDENT_AMBULATORY_CARE_PROVIDER_SITE_OTHER): Payer: Medicare Other | Admitting: Internal Medicine

## 2021-06-21 ENCOUNTER — Encounter: Payer: Self-pay | Admitting: Intensive Care

## 2021-06-21 VITALS — BP 96/60 | HR 83

## 2021-06-21 DIAGNOSIS — R0602 Shortness of breath: Secondary | ICD-10-CM | POA: Diagnosis not present

## 2021-06-21 DIAGNOSIS — I10 Essential (primary) hypertension: Secondary | ICD-10-CM | POA: Insufficient documentation

## 2021-06-21 DIAGNOSIS — R0789 Other chest pain: Secondary | ICD-10-CM | POA: Diagnosis not present

## 2021-06-21 DIAGNOSIS — R079 Chest pain, unspecified: Secondary | ICD-10-CM | POA: Insufficient documentation

## 2021-06-21 DIAGNOSIS — R404 Transient alteration of awareness: Secondary | ICD-10-CM | POA: Diagnosis not present

## 2021-06-21 DIAGNOSIS — J449 Chronic obstructive pulmonary disease, unspecified: Secondary | ICD-10-CM | POA: Diagnosis not present

## 2021-06-21 DIAGNOSIS — R6889 Other general symptoms and signs: Secondary | ICD-10-CM | POA: Diagnosis not present

## 2021-06-21 LAB — BASIC METABOLIC PANEL
Anion gap: 6 (ref 5–15)
BUN: 32 mg/dL — ABNORMAL HIGH (ref 8–23)
CO2: 27 mmol/L (ref 22–32)
Calcium: 8.7 mg/dL — ABNORMAL LOW (ref 8.9–10.3)
Chloride: 108 mmol/L (ref 98–111)
Creatinine, Ser: 1.68 mg/dL — ABNORMAL HIGH (ref 0.61–1.24)
GFR, Estimated: 41 mL/min — ABNORMAL LOW (ref >60)
Glucose, Bld: 163 mg/dL — ABNORMAL HIGH (ref 70–99)
Potassium: 4.6 mmol/L (ref 3.5–5.1)
Sodium: 141 mmol/L (ref 135–145)

## 2021-06-21 LAB — CBC
HCT: 43 % (ref 39.0–52.0)
Hemoglobin: 13.8 g/dL (ref 13.0–17.0)
MCH: 29.5 pg (ref 26.0–34.0)
MCHC: 32.1 g/dL (ref 30.0–36.0)
MCV: 91.9 fL (ref 80.0–100.0)
Platelets: 219 10*3/uL (ref 150–400)
RBC: 4.68 MIL/uL (ref 4.22–5.81)
RDW: 13.7 % (ref 11.5–15.5)
WBC: 5 10*3/uL (ref 4.0–10.5)
nRBC: 0 % (ref 0.0–0.2)

## 2021-06-21 LAB — TROPONIN I (HIGH SENSITIVITY)
Troponin I (High Sensitivity): 29 ng/L — ABNORMAL HIGH (ref <18)
Troponin I (High Sensitivity): 29 ng/L — ABNORMAL HIGH (ref <18)

## 2021-06-21 MED ORDER — NITROGLYCERIN 0.4 MG SL SUBL: 0.4000 mg | SUBLINGUAL_TABLET | SUBLINGUAL | 0 refills | Status: AC | PRN

## 2021-06-21 MED ORDER — NITROGLYCERIN 0.4 MG SL SUBL
0.4000 mg | SUBLINGUAL_TABLET | Freq: Once | SUBLINGUAL | Status: AC
  Administered 2021-06-21: 0.4 mg via SUBLINGUAL

## 2021-06-21 MED ORDER — PANTOPRAZOLE SODIUM 40 MG PO TBEC: 40.0000 mg | DELAYED_RELEASE_TABLET | Freq: Every day | ORAL | 1 refills | Status: AC

## 2021-06-21 MED ORDER — ASPIRIN 81 MG PO CHEW
324.0000 mg | CHEWABLE_TABLET | Freq: Once | ORAL | Status: AC
  Administered 2021-06-21: 324 mg via ORAL

## 2021-06-21 NOTE — Discharge Instructions (Addendum)
Please follow-up tomorrow morning at [place] office at 8:45 AM for a planned CT scan of your coronaries.  Return to the emergency department for any acute worsening pain or trouble breathing.  Please begin taking your Protonix each morning as prescribed.  You may also take nitroglycerin tablets every 5 minutes up to 3 times per day if needed for chest pain. ?

## 2021-06-21 NOTE — ED Provider Notes (Signed)
? ?[facility] ?Provider Note ? ? ? Event Date/Time  ? First MD Initiated Contact with Patient 06/21/21 1042   ?  (approximate) ? ?History  ? ?Chief Complaint: Chest Pain ? ?HPI ? ?Scott Guzman is a 81 y.o. male with a past medical history of hypertension, hyperlipidemia, PVCs, presents to the emergency department for chest pain.  According to the patient for the past several months he is becoming short of breath with exertion at times.  This morning states he developed pain across his chest, went and saw his doctor who referred him to the emergency department for evaluation.  Patient states he has had intermittent sharp pains/pricks to the chest and followed up with his cardiologist who has referred him for stress test next week.  Wife states the patient did have a cardiac catheterization approximately 5 years ago but no stents.  No history of MI previously.  Patient received nitroglycerin prior to arrival at the emergency department states his pain is completely resolved.  No nausea or diaphoresis at any point. ? ?Physical Exam  ? ?Triage Vital Signs: ?ED Triage Vitals  ?Enc Vitals Group  ?   BP 06/21/21 1023 108/70  ?   Pulse Rate 06/21/21 1026 66  ?   Resp 06/21/21 1026 15  ?   Temp 06/21/21 1031 97.7 ?F (36.5 ?C)  ?   Temp Source 06/21/21 1031 Oral  ?   SpO2 06/21/21 1026 100 %  ?   Weight 06/21/21 1027 130 lb (59 kg)  ?   Height 06/21/21 1027 '5\' 9"'$  (1.753 m)  ?   Head Circumference --   ?   Peak Flow --   ?   Pain Score 06/21/21 1027 4  ?   Pain Loc --   ?   Pain Edu? --   ?   Excl. in [place]? --   ? ? ?Most recent vital signs: ?Vitals:  ? 06/21/21 1031 06/21/21 1045  ?BP:    ?Pulse:  61  ?Resp:  11  ?Temp: 97.7 ?F (36.5 ?C)   ?SpO2:  100%  ? ? ?General: Awake, no distress.  ?CV:  Good peripheral perfusion.  Regular rate and rhythm  ?Resp:  Normal effort.  Equal breath sounds bilaterally.  ?Abd:  No distention.  Soft, nontender.  No rebound or guarding. ?Other:  No lower extremity edema or  tenderness. ? ? ?ED Results / Procedures / Treatments  ? ?EKG ? ?EKG viewed and interpreted by myself shows a normal sinus rhythm at 62 bpm with a narrow QRS, normal axis, normal intervals, no concerning ST changes. ? ?RADIOLOGY ? ?I personally reviewed the chest x-ray images, no acute abnormality seen on my evaluation. ?Chest x-ray read as COPD with no new infiltrate by radiology. ? ? ?MEDICATIONS ORDERED IN ED: ?Medications - No data to display ? ? ?IMPRESSION / MDM / ASSESSMENT AND PLAN / ED COURSE  ?I reviewed the triage vital signs and the nursing notes. ? ?Patient presents to the emergency department for chest pain since this morning, now completely resolved after receiving nitroglycerin.  No nausea or diaphoresis.  Does state over the last few months he has been getting short of breath with exertion.  No cardiac history but did have a cardiac catheterization approximately 5 years ago with no stents placed per wife.  Follows up with Dr. Humphrey Rolls of cardiology actually has a stress test scheduled for next week.  We will check labs including cardiac enzymes obtain a chest  x-ray.  Patient's EKG is reassuring. ? ?Patient's work-up is overall reassuring.  Slight troponin elevation however unchanged on repeat enzyme.  Chest x-ray is normal.  CBC is normal.  Chemistry shows slight renal insufficiency however largely unchanged from his baseline.  I spoke to [place] of cardiology.  As the patient is now chest pain-free believe the patient would be safe for discharge home but will have the patient follow-up tomorrow morning in his office at 9 AM for CT coronary.  I spoke to the patient wife regarding this they are agreeable to this plan of care.  Provided my normal chest pain return precautions.  We will discharge with Protonix and sublingual nitroglycerin as directed by Dr. Humphrey Rolls ? ?FINAL CLINICAL IMPRESSION(S) / ED DIAGNOSES  ? ?Chest pain ? ? ?Note:  This document was prepared using Dragon voice recognition software and  may include unintentional dictation errors. ?  ?Harvest Dark, MD ?06/21/21 1418 ? ?

## 2021-06-21 NOTE — ED Triage Notes (Signed)
Patient to ER via [facility] from [place] office with complaints of SHOB/CP. ? ?Patient reports dyspnea with exertion for several months. States he went to his pcp today to be evaluated for this. States the shortness of breath has worsened from walking, to just doing simple tasks around the house.  ? ?Today, patient reports new onset of 8/10 midsternal, non-radiating chest pain today. Now rates this as a 4/10 after receiving nitro. No cardiac history. ? ?Patient x4 aspirin and NTG at pcp office. ?

## 2021-06-21 NOTE — Progress Notes (Signed)
? ?BP 96/60   Pulse 83   ? ?Subjective:  ? ? Patient ID: Scott Guzman, male    DOB: [birth year], 81 y.o.   MRN: [id number] ? ?Chief Complaint  ?Patient presents with  ? Chest Pain  ? ? ?HPI: ?Scott Guzman is a 81 y.o. male ? ?Pt is here with his wife who was coming in for her appointment. Pt has had Chest pain x this morning when he woke up per her.  ?On arrival pt was placed in the procedure room ?Pts vitals were abnl with a low bp on arrival at 96/73 mm hg and PR of 147  ?Initlaly his pulse ox wasn't recordable.  ?He was placed on 2 lts of o2 on arrival and finally we could get a pulse ox which showed his Pulse Ox was 71 %  ? ? ? ?Chief Complaint  ?Patient presents with  ? Chest Pain  ? ? ?Relevant past medical, surgical, family and social history reviewed and updated as indicated. Interim medical history since our last visit reviewed. ?Allergies and medications reviewed and updated. ? ?Review of Systems  ?Constitutional:  Positive for fatigue. Negative for activity change, appetite change, chills and fever.  ?HENT:  Negative for congestion, ear pain and facial swelling.   ?Eyes:  Negative for pain, discharge and itching.  ?Respiratory:  Negative for wheezing.   ?Cardiovascular:  Positive for chest pain. Negative for palpitations and leg swelling.  ?Gastrointestinal:  Negative for diarrhea, nausea and vomiting.  ?Endocrine: Negative for cold intolerance, heat intolerance, polydipsia, polyphagia and polyuria.  ?Genitourinary:  Negative for difficulty urinating and dysuria.  ?Musculoskeletal:  Negative for myalgias.  ?Skin:  Negative for color change, rash and wound.  ?Neurological:  Negative for dizziness, light-headedness and headaches.  ?Psychiatric/Behavioral:  Negative for confusion, decreased concentration and suicidal ideas.   ? ?Per HPI unless specifically indicated above ? ?   ?Objective:  ?  ?BP 96/60   Pulse 83   ?Wt Readings from Last 3 Encounters:  ?06/21/21 130 lb (59 kg)  ?03/28/21 131 lb 12.8 oz  (59.8 kg)  ?02/19/21 132 lb (59.9 kg)  ?  ?Physical Exam ?Vitals and nursing note reviewed.  ?Constitutional:   ?   General: He is in acute distress.  ?   Appearance: Normal appearance. He is ill-appearing. He is not diaphoretic.  ?HENT:  ?   Right Ear: Tympanic membrane and external ear normal. There is no impacted cerumen.  ?   Left Ear: External ear normal.  ?   Nose: No congestion or rhinorrhea.  ?   Mouth/Throat:  ?   Pharynx: No oropharyngeal exudate or posterior oropharyngeal erythema.  ?Eyes:  ?   Conjunctiva/sclera: Conjunctivae normal.  ?Cardiovascular:  ?   Rate and Rhythm: Normal rate and regular rhythm.  ?Chest:  ?   Chest wall: No deformity or tenderness.  ?Abdominal:  ?   General: Abdomen is flat.  ?Musculoskeletal:  ?   Cervical back: No rigidity or tenderness.  ?   Left lower leg: No edema.  ?Skin: ?   General: Skin is warm.  ?   Coloration: Skin is pale.  ?Neurological:  ?   General: No focal deficit present.  ?Psychiatric:     ?   Mood and Affect: Mood is not anxious.     ?   Behavior: Behavior is not agitated.  ? ? ?Results for orders placed or performed during the hospital encounter of 03/29/21  ?I-STAT creatinine  ?Result Value  Ref Range  ? Creatinine, Ser 1.40 (H) 0.61 - 1.24 mg/dL  ? ?   ? ? ?Current Outpatient Medications:  ?  albuterol (VENTOLIN HFA) 108 (90 Base) MCG/ACT inhaler, Inhale 2 puffs into the lungs every 6 (six) hours as needed for wheezing or shortness of breath., Disp: 8 g, Rfl: 2 ?  amiodarone (PACERONE) 200 MG tablet, Take 100 mg by mouth daily., Disp: , Rfl:  ?  docusate sodium (COLACE) 50 MG capsule, Take 1 capsule (50 mg total) by mouth daily as needed for mild constipation., Disp: 30 capsule, Rfl: 2 ?  methimazole (TAPAZOLE) 10 MG tablet, Take 5 mg by mouth every other day., Disp: , Rfl:  ?  mirtazapine (REMERON) 30 MG tablet, Take 1 tablet (30 mg total) by mouth at bedtime. Please schedule office visit before any future refills., Disp: 90 tablet, Rfl: 4 ?  polyethylene  glycol powder (GLYCOLAX/MIRALAX) 17 GM/SCOOP powder, Take by mouth., Disp: , Rfl:  ?  predniSONE (DELTASONE) 20 MG tablet, Take 20 mg by mouth 2 (two) times daily., Disp: , Rfl:  ?  pyridostigmine (MESTINON) 60 MG tablet, TAKE 1/2 TO 1 TABLET BY MOUTH 3 TIMES A DAY. Call [phone] to schedule follow up for future refills, Disp: 270 tablet, Rfl: 0 ?  Rivaroxaban (XARELTO) 15 MG TABS tablet, Take 15 mg by mouth daily with supper., Disp: , Rfl:  ?  tamsulosin (FLOMAX) 0.4 MG CAPS capsule, Take 1 capsule (0.4 mg total) by mouth daily., Disp: 30 capsule, Rfl: 0  ? ? ?Assessment & Plan:  ?Chest pain  ?Pt was given NG x 1 SL  ? ASA x 4 for his Chest pain which seemed to help brought pain down immediately to a 5 / 10  ?Pt was placed on o2 via [place] then changed to Non rebreather by EMS ?Pt was sent to the ER via EMS ?911 was called and pt was transported via ambulance  ? ? ?Problem List Items Addressed This Visit   ?None ?Visit Diagnoses   ? ? Chest pain, unspecified type    -  Primary  ? Relevant Medications  ? aspirin chewable tablet 324 mg (Completed)  ? nitroGLYCERIN (NITROSTAT) SL tablet 0.4 mg (Completed)  ? Other Relevant Orders  ? EKG 12-Lead (Completed)  ? ?  ?  ? ?Orders Placed This Encounter  ?Procedures  ? EKG 12-Lead  ?  ? ?Meds ordered this encounter  ?Medications  ? aspirin chewable tablet 324 mg  ? nitroGLYCERIN (NITROSTAT) SL tablet 0.4 mg  ?  ? ?Follow up plan: ?No follow-ups on file. ? ?

## 2021-06-21 NOTE — ED Notes (Signed)
Pt in x-ray at this time

## 2021-06-22 DIAGNOSIS — E782 Mixed hyperlipidemia: Secondary | ICD-10-CM | POA: Diagnosis not present

## 2021-06-22 DIAGNOSIS — R0602 Shortness of breath: Secondary | ICD-10-CM | POA: Diagnosis not present

## 2021-06-22 DIAGNOSIS — I10 Essential (primary) hypertension: Secondary | ICD-10-CM | POA: Diagnosis not present

## 2021-06-22 DIAGNOSIS — I2510 Atherosclerotic heart disease of native coronary artery without angina pectoris: Secondary | ICD-10-CM | POA: Diagnosis not present

## 2021-06-22 DIAGNOSIS — R079 Chest pain, unspecified: Secondary | ICD-10-CM | POA: Diagnosis not present

## 2021-06-22 DIAGNOSIS — I4891 Unspecified atrial fibrillation: Secondary | ICD-10-CM | POA: Diagnosis not present

## 2021-06-22 DIAGNOSIS — I340 Nonrheumatic mitral (valve) insufficiency: Secondary | ICD-10-CM | POA: Diagnosis not present

## 2021-06-25 ENCOUNTER — Telehealth: Payer: Self-pay | Admitting: *Deleted

## 2021-06-25 DIAGNOSIS — E782 Mixed hyperlipidemia: Secondary | ICD-10-CM | POA: Diagnosis not present

## 2021-06-25 DIAGNOSIS — I2510 Atherosclerotic heart disease of native coronary artery without angina pectoris: Secondary | ICD-10-CM | POA: Diagnosis not present

## 2021-06-25 DIAGNOSIS — R0602 Shortness of breath: Secondary | ICD-10-CM | POA: Diagnosis not present

## 2021-06-25 DIAGNOSIS — R079 Chest pain, unspecified: Secondary | ICD-10-CM | POA: Diagnosis not present

## 2021-06-25 NOTE — Telephone Encounter (Signed)
Transition Care Management Unsuccessful Follow-up Telephone Call  Date of discharge and from where:  [facility]  Attempts:  1st Attempt  Reason for unsuccessful TCM follow-up call:  Left voice message    

## 2021-06-26 DIAGNOSIS — I4891 Unspecified atrial fibrillation: Secondary | ICD-10-CM | POA: Diagnosis not present

## 2021-06-26 DIAGNOSIS — I2510 Atherosclerotic heart disease of native coronary artery without angina pectoris: Secondary | ICD-10-CM | POA: Diagnosis not present

## 2021-06-26 DIAGNOSIS — R079 Chest pain, unspecified: Secondary | ICD-10-CM | POA: Diagnosis not present

## 2021-06-26 DIAGNOSIS — I340 Nonrheumatic mitral (valve) insufficiency: Secondary | ICD-10-CM | POA: Diagnosis not present

## 2021-06-26 DIAGNOSIS — I10 Essential (primary) hypertension: Secondary | ICD-10-CM | POA: Diagnosis not present

## 2021-06-26 NOTE — Telephone Encounter (Signed)
Transition Care Management Follow-up Telephone Call ?Date of discharge and from where: [facility]  06-21-2021 ?How have you been since you were released from the hospital? He is feeling better ?Any questions or concerns?  ?Still concerned about having no answers after seeing the cardiologist ?Patient is very happy with care from [facility] practice ? ?Items Reviewed: ?Did the pt receive and understand the discharge instructions provided? Yes  ?Medications obtained and verified? No  ?Other? No  ?Any new allergies since your discharge? No  ?Dietary orders reviewed? No ?Do you have support at home? Yes  ? ?Home Care and Equipment/Supplies: ?Were home health services ordered?  ?If so, what is the name of the agency?   ?Has the agency set up a time to come to the patient's home?  ?Were any new equipment or medical supplies ordered?   ?What is the name of the medical supply agency?  ?Were you able to get the supplies/equipment?  ?Do you have any questions related to the use of the equipment or supplies?  ? ?Functional Questionnaire: (I = Independent and D = Dependent) ?ADLs:  I  ? ?Bathing/Dressing- I ? ?Meal Prep- I ? ?Eating- I ? ?Maintaining continence- I ? ?Transferring/Ambulation- I ? ?Managing Meds- I ? ?Follow up appointments reviewed: ? ?PCP Hospital f/u appt completed ?[facility] f/u appt confirmed? Has seen cardiologist 3-31 and 4-3  ?Are transportation arrangements needed? No  ?If their condition worsens, is the pt aware to call PCP or go to the Emergency Dept.? Yes ?Was the patient provided with contact information for the PCP's office or ED? Yes ?Was to pt encouraged to call back with questions or concerns? Yes  ?

## 2021-07-03 ENCOUNTER — Emergency Department: Payer: Medicare Other

## 2021-07-03 ENCOUNTER — Encounter: Payer: Self-pay | Admitting: Intensive Care

## 2021-07-03 ENCOUNTER — Other Ambulatory Visit: Payer: Self-pay

## 2021-07-03 ENCOUNTER — Emergency Department
Admission: EM | Admit: 2021-07-03 | Discharge: 2021-07-03 | Disposition: A | Discharge status: Home / Self Care | Payer: Medicare Other | Source: Home / Self Care | Attending: Emergency Medicine | Admitting: Emergency Medicine

## 2021-07-03 DIAGNOSIS — Z79899 Other long term (current) drug therapy: Secondary | ICD-10-CM | POA: Diagnosis not present

## 2021-07-03 DIAGNOSIS — M47812 Spondylosis without myelopathy or radiculopathy, cervical region: Secondary | ICD-10-CM | POA: Diagnosis not present

## 2021-07-03 DIAGNOSIS — I160 Hypertensive urgency: Secondary | ICD-10-CM | POA: Diagnosis not present

## 2021-07-03 DIAGNOSIS — M4312 Spondylolisthesis, cervical region: Secondary | ICD-10-CM | POA: Diagnosis not present

## 2021-07-03 DIAGNOSIS — E785 Hyperlipidemia, unspecified: Secondary | ICD-10-CM | POA: Diagnosis not present

## 2021-07-03 DIAGNOSIS — N132 Hydronephrosis with renal and ureteral calculous obstruction: Secondary | ICD-10-CM | POA: Diagnosis not present

## 2021-07-03 DIAGNOSIS — G9341 Metabolic encephalopathy: Secondary | ICD-10-CM | POA: Diagnosis not present

## 2021-07-03 DIAGNOSIS — N200 Calculus of kidney: Secondary | ICD-10-CM

## 2021-07-03 DIAGNOSIS — S299XXA Unspecified injury of thorax, initial encounter: Secondary | ICD-10-CM | POA: Diagnosis not present

## 2021-07-03 DIAGNOSIS — Z515 Encounter for palliative care: Secondary | ICD-10-CM | POA: Diagnosis not present

## 2021-07-03 DIAGNOSIS — Z800 Family history of malignant neoplasm of digestive organs: Secondary | ICD-10-CM | POA: Diagnosis not present

## 2021-07-03 DIAGNOSIS — D7389 Other diseases of spleen: Secondary | ICD-10-CM | POA: Diagnosis not present

## 2021-07-03 DIAGNOSIS — I700 Atherosclerosis of aorta: Secondary | ICD-10-CM | POA: Diagnosis not present

## 2021-07-03 DIAGNOSIS — I2510 Atherosclerotic heart disease of native coronary artery without angina pectoris: Secondary | ICD-10-CM | POA: Insufficient documentation

## 2021-07-03 DIAGNOSIS — I10 Essential (primary) hypertension: Secondary | ICD-10-CM | POA: Insufficient documentation

## 2021-07-03 DIAGNOSIS — Z20822 Contact with and (suspected) exposure to covid-19: Secondary | ICD-10-CM | POA: Diagnosis not present

## 2021-07-03 DIAGNOSIS — M40204 Unspecified kyphosis, thoracic region: Secondary | ICD-10-CM | POA: Diagnosis not present

## 2021-07-03 DIAGNOSIS — Z7901 Long term (current) use of anticoagulants: Secondary | ICD-10-CM | POA: Diagnosis not present

## 2021-07-03 DIAGNOSIS — N179 Acute kidney failure, unspecified: Secondary | ICD-10-CM | POA: Diagnosis not present

## 2021-07-03 DIAGNOSIS — Z85038 Personal history of other malignant neoplasm of large intestine: Secondary | ICD-10-CM | POA: Diagnosis not present

## 2021-07-03 DIAGNOSIS — Z043 Encounter for examination and observation following other accident: Secondary | ICD-10-CM | POA: Diagnosis not present

## 2021-07-03 DIAGNOSIS — D649 Anemia, unspecified: Secondary | ICD-10-CM | POA: Diagnosis not present

## 2021-07-03 DIAGNOSIS — Z7952 Long term (current) use of systemic steroids: Secondary | ICD-10-CM | POA: Diagnosis not present

## 2021-07-03 DIAGNOSIS — G319 Degenerative disease of nervous system, unspecified: Secondary | ICD-10-CM | POA: Diagnosis not present

## 2021-07-03 DIAGNOSIS — R1030 Lower abdominal pain, unspecified: Secondary | ICD-10-CM | POA: Diagnosis not present

## 2021-07-03 DIAGNOSIS — S3992XA Unspecified injury of lower back, initial encounter: Secondary | ICD-10-CM | POA: Diagnosis not present

## 2021-07-03 DIAGNOSIS — Z781 Physical restraint status: Secondary | ICD-10-CM | POA: Diagnosis not present

## 2021-07-03 DIAGNOSIS — R296 Repeated falls: Secondary | ICD-10-CM | POA: Diagnosis not present

## 2021-07-03 DIAGNOSIS — S3993XA Unspecified injury of pelvis, initial encounter: Secondary | ICD-10-CM | POA: Diagnosis not present

## 2021-07-03 DIAGNOSIS — N3289 Other specified disorders of bladder: Secondary | ICD-10-CM | POA: Diagnosis not present

## 2021-07-03 DIAGNOSIS — Z66 Do not resuscitate: Secondary | ICD-10-CM | POA: Diagnosis not present

## 2021-07-03 DIAGNOSIS — S3991XA Unspecified injury of abdomen, initial encounter: Secondary | ICD-10-CM | POA: Diagnosis not present

## 2021-07-03 DIAGNOSIS — I4891 Unspecified atrial fibrillation: Secondary | ICD-10-CM | POA: Diagnosis not present

## 2021-07-03 LAB — COMPREHENSIVE METABOLIC PANEL
ALT: 13 U/L (ref 0–44)
AST: 28 U/L (ref 15–41)
Albumin: 4.1 g/dL (ref 3.5–5.0)
Alkaline Phosphatase: 69 U/L (ref 38–126)
Anion gap: 8 (ref 5–15)
BUN: 21 mg/dL (ref 8–23)
CO2: 29 mmol/L (ref 22–32)
Calcium: 9.4 mg/dL (ref 8.9–10.3)
Chloride: 100 mmol/L (ref 98–111)
Creatinine, Ser: 1.43 mg/dL — ABNORMAL HIGH (ref 0.61–1.24)
GFR, Estimated: 50 mL/min — ABNORMAL LOW (ref >60)
Glucose, Bld: 120 mg/dL — ABNORMAL HIGH (ref 70–99)
Potassium: 5.1 mmol/L (ref 3.5–5.1)
Sodium: 137 mmol/L (ref 135–145)
Total Bilirubin: 0.7 mg/dL (ref 0.3–1.2)
Total Protein: 7.4 g/dL (ref 6.5–8.1)

## 2021-07-03 LAB — URINALYSIS, ROUTINE W REFLEX MICROSCOPIC
Bacteria, UA: NONE SEEN
Bilirubin Urine: NEGATIVE
Glucose, UA: NEGATIVE mg/dL
Ketones, ur: NEGATIVE mg/dL
Leukocytes,Ua: NEGATIVE
Nitrite: NEGATIVE
Protein, ur: NEGATIVE mg/dL
RBC / HPF: >50 RBC/hpf — ABNORMAL HIGH (ref 0–5)
Specific Gravity, Urine: 1.014 (ref 1.005–1.030)
Squamous Epithelial / HPF: NONE SEEN (ref 0–5)
pH: 8 (ref 5.0–8.0)

## 2021-07-03 LAB — CBC
HCT: 43.1 % (ref 39.0–52.0)
Hemoglobin: 13.9 g/dL (ref 13.0–17.0)
MCH: 29 pg (ref 26.0–34.0)
MCHC: 32.3 g/dL (ref 30.0–36.0)
MCV: 90 fL (ref 80.0–100.0)
Platelets: 226 10*3/uL (ref 150–400)
RBC: 4.79 MIL/uL (ref 4.22–5.81)
RDW: 13.2 % (ref 11.5–15.5)
WBC: 7.7 10*3/uL (ref 4.0–10.5)
nRBC: 0 % (ref 0.0–0.2)

## 2021-07-03 LAB — LIPASE, BLOOD: Lipase: 34 U/L (ref 11–51)

## 2021-07-03 MED ORDER — HYDROCODONE-ACETAMINOPHEN 5-325 MG PO TABS
1.0000 | ORAL_TABLET | Freq: Once | ORAL | Status: AC
  Administered 2021-07-03: 1 via ORAL
  Filled 2021-07-03: qty 1

## 2021-07-03 MED ORDER — TRAMADOL HCL 50 MG PO TABS: 50.0000 mg | ORAL_TABLET | Freq: Four times a day (QID) | ORAL | 0 refills | Status: DC | PRN

## 2021-07-03 MED ORDER — NAPROXEN 500 MG PO TABS
500.0000 mg | ORAL_TABLET | Freq: Once | ORAL | Status: AC
Start: 2021-07-03 — End: 2021-07-03
  Administered 2021-07-03: 500 mg via ORAL
  Filled 2021-07-03: qty 1

## 2021-07-03 MED ORDER — SODIUM CHLORIDE 0.9 % IV SOLN
1000.0000 mL | Freq: Once | INTRAVENOUS | Status: AC
Start: 2021-07-03 — End: 2021-07-03
  Administered 2021-07-03: 1000 mL via INTRAVENOUS

## 2021-07-03 MED ORDER — ONDANSETRON 4 MG PO TBDP
4.0000 mg | ORAL_TABLET | Freq: Once | ORAL | Status: AC | PRN
  Administered 2021-07-03: 4 mg via ORAL
  Filled 2021-07-03: qty 1

## 2021-07-03 MED ORDER — NAPROXEN 500 MG PO TABS: 500.0000 mg | ORAL_TABLET | Freq: Two times a day (BID) | ORAL | 2 refills | Status: DC

## 2021-07-03 MED ORDER — MORPHINE SULFATE (PF) 4 MG/ML IV SOLN
4.0000 mg | Freq: Once | INTRAVENOUS | Status: AC
  Administered 2021-07-03: 4 mg via INTRAVENOUS
  Filled 2021-07-03: qty 1

## 2021-07-03 MED ORDER — ONDANSETRON 4 MG PO TBDP: 4.0000 mg | ORAL_TABLET | Freq: Three times a day (TID) | ORAL | 0 refills | Status: AC | PRN

## 2021-07-03 MED ORDER — ONDANSETRON HCL 4 MG/2ML IJ SOLN
4.0000 mg | Freq: Once | INTRAMUSCULAR | Status: AC
  Administered 2021-07-03: 4 mg via INTRAVENOUS
  Filled 2021-07-03: qty 2

## 2021-07-03 MED ORDER — TAMSULOSIN HCL 0.4 MG PO CAPS: 0.4000 mg | ORAL_CAPSULE | Freq: Every day | ORAL | 0 refills | Status: AC

## 2021-07-03 NOTE — ED Triage Notes (Signed)
Patient c/o severe pain in left abdomen with nausea. Denies V/D. Patient also c/o hypertension.  ?

## 2021-07-03 NOTE — ED Provider Notes (Addendum)
? ?[facility] ?Provider Note ? ? ? Event Date/Time  ? First MD Initiated Contact with Patient 07/03/21 1825   ?  (approximate) ? ? ?History  ? ?Abdominal Pain ? ? ?HPI ? ?NICHOLAS TROMPETER is a 81 y.o. male with a history of kidney stones, CAD, hypertension who presents with complaints of left lower quadrant pain which started relatively abruptly today.  He does not think that he has had hematuria.  He is on Xarelto.  No fevers or chills.  Mild nausea. ?  ? ? ?Physical Exam  ? ?Triage Vital Signs: ?ED Triage Vitals  ?Enc Vitals Group  ?   BP 07/03/21 1726 (!) 217/85  ?   Pulse Rate 07/03/21 1726 63  ?   Resp 07/03/21 1726 16  ?   Temp 07/03/21 1726 98.3 ?F (36.8 ?C)  ?   Temp Source 07/03/21 1726 Oral  ?   SpO2 07/03/21 1726 97 %  ?   Weight 07/03/21 1727 59 kg (130 lb)  ?   Height 07/03/21 1727 1.753 m ('5\' 9"'$ )  ?   Head Circumference --   ?   Peak Flow --   ?   Pain Score 07/03/21 1727 9  ?   Pain Loc --   ?   Pain Edu? --   ?   Excl. in [place]? --   ? ? ?Most recent vital signs: ?Vitals:  ? 07/03/21 1804 07/03/21 1911  ?BP: (!) 209/89 (!) 184/92  ?Pulse:    ?Resp: 18 16  ?Temp: 98.5 ?F (36.9 ?C)   ?SpO2: 98% 99%  ? ? ? ?General: Awake, no distress.  ?CV:  Good peripheral perfusion.  ?Resp:  Normal effort.  ?Abd:  No distention.  Mild tenderness left lower quadrant, mild CVA tenderness ?Other:   ? ? ?ED Results / Procedures / Treatments  ? ?Labs ?(all labs ordered are listed, but only abnormal results are displayed) ?Labs Reviewed  ?COMPREHENSIVE METABOLIC PANEL - Abnormal; Notable for the following components:  ?    Result Value  ? Glucose, Bld 120 (*)   ? Creatinine, Ser 1.43 (*)   ? GFR, Estimated 50 (*)   ? All other components within normal limits  ?LIPASE, BLOOD  ?CBC  ?URINALYSIS, ROUTINE W REFLEX MICROSCOPIC  ? ? ? ?EKG ? ? ? ? ?RADIOLOGY ?CT renal density reviewed by me, consistent with ureterolithiasis and left ? ? ? ?PROCEDURES: ? ?Critical Care performed:  ? ?Procedures ? ? ?MEDICATIONS  ORDERED IN ED: ?Medications  ?ondansetron (ZOFRAN-ODT) disintegrating tablet 4 mg (4 mg Oral Given 07/03/21 1736)  ?0.9 %  sodium chloride infusion (1,000 mLs Intravenous Started During Downtime 07/03/21 1918)  ?morphine (PF) 4 MG/ML injection 4 mg (4 mg Intravenous Given 07/03/21 1919)  ?ondansetron [facility]) injection 4 mg (4 mg Intravenous Given 07/03/21 1919)  ? ? ? ?IMPRESSION / MDM / ASSESSMENT AND PLAN / ED COURSE  ?I reviewed the triage vital signs and the nursing notes. ? ? ?Patient presents with left lower quadrant abdominal pain as detailed above, differential includes ureterolithiasis, UTI, diverticulitis/colitis. ? ?We will treat with IV morphine, IV Zofran, obtain labs, urine, CT renal stone study ? ?Lab work reviewed and is overall reassuring, normal white blood cell count, pending urinalysis ? ?CT scan is consistent with 5 mm stone on the left ? ?His pain is resolved after treatment, urinalysis is reassuring, appropriate for discharge with outpatient follow-up, return precautions discussed ? ? ? ? ?  ? ? ?  FINAL CLINICAL IMPRESSION(S) / ED DIAGNOSES  ? ?Final diagnoses:  ?Kidney stone  ? ? ? ?Rx / DC Orders  ? ?ED Discharge Orders   ? ? None  ? ?  ? ? ? ?Note:  This document was prepared using Dragon voice recognition software and may include unintentional dictation errors. ?  ?Lavonia Drafts, MD ?07/03/21 1934 ? ?  ?Lavonia Drafts, MD ?07/03/21 2022 ? ?

## 2021-07-04 ENCOUNTER — Telehealth: Payer: Self-pay | Admitting: Urology

## 2021-07-04 NOTE — Telephone Encounter (Signed)
Patient in ED and needs f/u appt. ? ?  IMPRESSION: ?1. 5 x 5 mm distal left ureteral calculus causing moderate to marked ?left-sided hydroureteronephrosis. ?2. 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 ?3. Bilateral renal calculi. ?4. No other acute abdominal/pelvic findings and no mass lesions or ?adenopathy. ?  ?Aortic Atherosclerosis (ICD10-I70.0). ? ?Per note on 06/22/21 can I schedule an appt ? If so, when? ?

## 2021-07-04 NOTE — Telephone Encounter (Signed)
Notified patient as instructed, patient pleased. Discussed follow-up appointments, patient agrees  

## 2021-07-04 NOTE — Telephone Encounter (Signed)
Have him come in at 10:00 tomorrow 4/13.  Please put a hold on the open 1115 appointment.  Thanks ?

## 2021-07-05 ENCOUNTER — Other Ambulatory Visit: Payer: Self-pay | Admitting: Urology

## 2021-07-05 ENCOUNTER — Encounter: Payer: Self-pay | Admitting: Urology

## 2021-07-05 ENCOUNTER — Inpatient Hospital Stay
Admission: EM | Admit: 2021-07-05 | Discharge: 2021-07-13 | DRG: 659 | Disposition: A | Discharge status: Skilled Nursing Facility | Payer: Medicare Other | Attending: Internal Medicine | Admitting: Internal Medicine

## 2021-07-05 ENCOUNTER — Emergency Department: Payer: Medicare Other

## 2021-07-05 ENCOUNTER — Ambulatory Visit: Payer: Self-pay

## 2021-07-05 ENCOUNTER — Telehealth: Payer: Self-pay

## 2021-07-05 ENCOUNTER — Ambulatory Visit: Payer: Medicare Other | Admitting: Urology

## 2021-07-05 VITALS — BP 113/71 | HR 75 | Ht 70.0 in | Wt 130.0 lb

## 2021-07-05 DIAGNOSIS — N201 Calculus of ureter: Secondary | ICD-10-CM

## 2021-07-05 DIAGNOSIS — M40204 Unspecified kyphosis, thoracic region: Secondary | ICD-10-CM | POA: Diagnosis not present

## 2021-07-05 DIAGNOSIS — Z79899 Other long term (current) drug therapy: Secondary | ICD-10-CM | POA: Diagnosis not present

## 2021-07-05 DIAGNOSIS — Z043 Encounter for examination and observation following other accident: Secondary | ICD-10-CM | POA: Diagnosis not present

## 2021-07-05 DIAGNOSIS — Z7901 Long term (current) use of anticoagulants: Secondary | ICD-10-CM

## 2021-07-05 DIAGNOSIS — Z66 Do not resuscitate: Secondary | ICD-10-CM | POA: Diagnosis present

## 2021-07-05 DIAGNOSIS — I10 Essential (primary) hypertension: Secondary | ICD-10-CM | POA: Diagnosis not present

## 2021-07-05 DIAGNOSIS — N131 Hydronephrosis with ureteral stricture, not elsewhere classified: Secondary | ICD-10-CM | POA: Diagnosis not present

## 2021-07-05 DIAGNOSIS — D649 Anemia, unspecified: Secondary | ICD-10-CM | POA: Diagnosis present

## 2021-07-05 DIAGNOSIS — Z85038 Personal history of other malignant neoplasm of large intestine: Secondary | ICD-10-CM | POA: Diagnosis not present

## 2021-07-05 DIAGNOSIS — G9341 Metabolic encephalopathy: Secondary | ICD-10-CM | POA: Diagnosis present

## 2021-07-05 DIAGNOSIS — G319 Degenerative disease of nervous system, unspecified: Secondary | ICD-10-CM | POA: Diagnosis not present

## 2021-07-05 DIAGNOSIS — Z7189 Other specified counseling: Secondary | ICD-10-CM | POA: Diagnosis not present

## 2021-07-05 DIAGNOSIS — N179 Acute kidney failure, unspecified: Secondary | ICD-10-CM | POA: Diagnosis not present

## 2021-07-05 DIAGNOSIS — N200 Calculus of kidney: Secondary | ICD-10-CM | POA: Insufficient documentation

## 2021-07-05 DIAGNOSIS — W19XXXA Unspecified fall, initial encounter: Secondary | ICD-10-CM | POA: Diagnosis not present

## 2021-07-05 DIAGNOSIS — N132 Hydronephrosis with renal and ureteral calculous obstruction: Secondary | ICD-10-CM | POA: Diagnosis not present

## 2021-07-05 DIAGNOSIS — Z800 Family history of malignant neoplasm of digestive organs: Secondary | ICD-10-CM

## 2021-07-05 DIAGNOSIS — M4312 Spondylolisthesis, cervical region: Secondary | ICD-10-CM | POA: Diagnosis not present

## 2021-07-05 DIAGNOSIS — N3289 Other specified disorders of bladder: Secondary | ICD-10-CM | POA: Diagnosis present

## 2021-07-05 DIAGNOSIS — M47812 Spondylosis without myelopathy or radiculopathy, cervical region: Secondary | ICD-10-CM | POA: Diagnosis not present

## 2021-07-05 DIAGNOSIS — F0390 Unspecified dementia without behavioral disturbance: Secondary | ICD-10-CM | POA: Diagnosis present

## 2021-07-05 DIAGNOSIS — Z515 Encounter for palliative care: Secondary | ICD-10-CM | POA: Diagnosis not present

## 2021-07-05 DIAGNOSIS — I160 Hypertensive urgency: Secondary | ICD-10-CM | POA: Diagnosis not present

## 2021-07-05 DIAGNOSIS — M6259 Muscle wasting and atrophy, not elsewhere classified, multiple sites: Secondary | ICD-10-CM | POA: Diagnosis not present

## 2021-07-05 DIAGNOSIS — R296 Repeated falls: Secondary | ICD-10-CM | POA: Diagnosis not present

## 2021-07-05 DIAGNOSIS — E785 Hyperlipidemia, unspecified: Secondary | ICD-10-CM | POA: Diagnosis present

## 2021-07-05 DIAGNOSIS — Z7952 Long term (current) use of systemic steroids: Secondary | ICD-10-CM

## 2021-07-05 DIAGNOSIS — S3991XA Unspecified injury of abdomen, initial encounter: Secondary | ICD-10-CM | POA: Diagnosis not present

## 2021-07-05 DIAGNOSIS — Z781 Physical restraint status: Secondary | ICD-10-CM | POA: Diagnosis not present

## 2021-07-05 DIAGNOSIS — I2510 Atherosclerotic heart disease of native coronary artery without angina pectoris: Secondary | ICD-10-CM | POA: Diagnosis present

## 2021-07-05 DIAGNOSIS — Z20822 Contact with and (suspected) exposure to covid-19: Secondary | ICD-10-CM | POA: Diagnosis present

## 2021-07-05 DIAGNOSIS — S3993XA Unspecified injury of pelvis, initial encounter: Secondary | ICD-10-CM | POA: Diagnosis not present

## 2021-07-05 DIAGNOSIS — R319 Hematuria, unspecified: Secondary | ICD-10-CM

## 2021-07-05 DIAGNOSIS — N1330 Unspecified hydronephrosis: Secondary | ICD-10-CM | POA: Diagnosis not present

## 2021-07-05 DIAGNOSIS — S299XXA Unspecified injury of thorax, initial encounter: Secondary | ICD-10-CM | POA: Diagnosis not present

## 2021-07-05 DIAGNOSIS — I4891 Unspecified atrial fibrillation: Secondary | ICD-10-CM | POA: Diagnosis present

## 2021-07-05 DIAGNOSIS — R6889 Other general symptoms and signs: Secondary | ICD-10-CM | POA: Diagnosis not present

## 2021-07-05 DIAGNOSIS — F03C0 Unspecified dementia, severe, without behavioral disturbance, psychotic disturbance, mood disturbance, and anxiety: Secondary | ICD-10-CM | POA: Diagnosis not present

## 2021-07-05 DIAGNOSIS — R1311 Dysphagia, oral phase: Secondary | ICD-10-CM | POA: Diagnosis not present

## 2021-07-05 DIAGNOSIS — R4182 Altered mental status, unspecified: Secondary | ICD-10-CM

## 2021-07-05 DIAGNOSIS — R7989 Other specified abnormal findings of blood chemistry: Secondary | ICD-10-CM

## 2021-07-05 DIAGNOSIS — R627 Adult failure to thrive: Secondary | ICD-10-CM | POA: Diagnosis not present

## 2021-07-05 DIAGNOSIS — E7800 Pure hypercholesterolemia, unspecified: Secondary | ICD-10-CM | POA: Diagnosis not present

## 2021-07-05 DIAGNOSIS — Z7401 Bed confinement status: Secondary | ICD-10-CM | POA: Diagnosis not present

## 2021-07-05 DIAGNOSIS — R278 Other lack of coordination: Secondary | ICD-10-CM | POA: Diagnosis not present

## 2021-07-05 DIAGNOSIS — R2681 Unsteadiness on feet: Secondary | ICD-10-CM | POA: Diagnosis not present

## 2021-07-05 DIAGNOSIS — Z743 Need for continuous supervision: Secondary | ICD-10-CM | POA: Diagnosis not present

## 2021-07-05 DIAGNOSIS — S3992XA Unspecified injury of lower back, initial encounter: Secondary | ICD-10-CM | POA: Diagnosis not present

## 2021-07-05 LAB — URINALYSIS, ROUTINE W REFLEX MICROSCOPIC
Bacteria, UA: NONE SEEN
Bilirubin Urine: NEGATIVE
Glucose, UA: NEGATIVE mg/dL
Ketones, ur: 5 mg/dL — AB
Nitrite: NEGATIVE
Protein, ur: NEGATIVE mg/dL
RBC / HPF: >50 RBC/hpf — ABNORMAL HIGH (ref 0–5)
Specific Gravity, Urine: 1.027 (ref 1.005–1.030)
Squamous Epithelial / HPF: NONE SEEN (ref 0–5)
pH: 5 (ref 5.0–8.0)

## 2021-07-05 LAB — COMPREHENSIVE METABOLIC PANEL
ALT: 12 U/L (ref 0–44)
AST: 26 U/L (ref 15–41)
Albumin: 3.8 g/dL (ref 3.5–5.0)
Alkaline Phosphatase: 63 U/L (ref 38–126)
Anion gap: 8 (ref 5–15)
BUN: 33 mg/dL — ABNORMAL HIGH (ref 8–23)
CO2: 26 mmol/L (ref 22–32)
Calcium: 8.7 mg/dL — ABNORMAL LOW (ref 8.9–10.3)
Chloride: 102 mmol/L (ref 98–111)
Creatinine, Ser: 2.58 mg/dL — ABNORMAL HIGH (ref 0.61–1.24)
GFR, Estimated: 25 mL/min — ABNORMAL LOW (ref >60)
Glucose, Bld: 104 mg/dL — ABNORMAL HIGH (ref 70–99)
Potassium: 5.1 mmol/L (ref 3.5–5.1)
Sodium: 136 mmol/L (ref 135–145)
Total Bilirubin: 1 mg/dL (ref 0.3–1.2)
Total Protein: 6.6 g/dL (ref 6.5–8.1)

## 2021-07-05 LAB — URINALYSIS, COMPLETE
Bilirubin, UA: NEGATIVE
Glucose, UA: NEGATIVE
Leukocytes,UA: NEGATIVE
Nitrite, UA: NEGATIVE
Specific Gravity, UA: >1.03 — ABNORMAL HIGH (ref 1.005–1.030)
Urobilinogen, Ur: 0.2 mg/dL (ref 0.2–1.0)
pH, UA: 5.5 (ref 5.0–7.5)

## 2021-07-05 LAB — T4, FREE: Free T4: 0.89 ng/dL (ref 0.61–1.12)

## 2021-07-05 LAB — TSH: TSH: 8.337 u[IU]/mL — ABNORMAL HIGH (ref 0.350–4.500)

## 2021-07-05 LAB — MICROSCOPIC EXAMINATION

## 2021-07-05 LAB — CBC
HCT: 36.7 % — ABNORMAL LOW (ref 39.0–52.0)
Hemoglobin: 11.7 g/dL — ABNORMAL LOW (ref 13.0–17.0)
MCH: 29.1 pg (ref 26.0–34.0)
MCHC: 31.9 g/dL (ref 30.0–36.0)
MCV: 91.3 fL (ref 80.0–100.0)
Platelets: 179 10*3/uL (ref 150–400)
RBC: 4.02 MIL/uL — ABNORMAL LOW (ref 4.22–5.81)
RDW: 13.5 % (ref 11.5–15.5)
WBC: 8.6 10*3/uL (ref 4.0–10.5)
nRBC: 0 % (ref 0.0–0.2)

## 2021-07-05 LAB — CBG MONITORING, ED: Glucose-Capillary: 100 mg/dL — ABNORMAL HIGH (ref 70–99)

## 2021-07-05 LAB — RESP PANEL BY RT-PCR (FLU A&B, COVID) ARPGX2
Influenza A by PCR: NEGATIVE
Influenza B by PCR: NEGATIVE
SARS Coronavirus 2 by RT PCR: NEGATIVE

## 2021-07-05 LAB — PROTIME-INR
INR: 1.2 (ref 0.8–1.2)
Prothrombin Time: 14.8 seconds (ref 11.4–15.2)

## 2021-07-05 LAB — AMMONIA: Ammonia: 17 umol/L (ref 9–35)

## 2021-07-05 LAB — APTT: aPTT: 30 seconds (ref 24–36)

## 2021-07-05 MED ORDER — ONDANSETRON HCL 4 MG/2ML IJ SOLN
4.0000 mg | Freq: Four times a day (QID) | INTRAMUSCULAR | Status: DC | PRN
Start: 2021-07-05 — End: 2021-07-13

## 2021-07-05 MED ORDER — SODIUM CHLORIDE 0.9 % IV SOLN: INTRAVENOUS | Status: DC

## 2021-07-05 MED ORDER — IOHEXOL 300 MG/ML  SOLN
100.0000 mL | Freq: Once | INTRAMUSCULAR | Status: AC | PRN
  Administered 2021-07-05: 100 mL via INTRAVENOUS

## 2021-07-05 MED ORDER — HYDRALAZINE HCL 20 MG/ML IJ SOLN
5.0000 mg | Freq: Four times a day (QID) | INTRAMUSCULAR | Status: DC | PRN
  Administered 2021-07-06: 5 mg via INTRAVENOUS
  Filled 2021-07-05: qty 1

## 2021-07-05 MED ORDER — NITROGLYCERIN 0.4 MG SL SUBL: 0.4000 mg | SUBLINGUAL_TABLET | SUBLINGUAL | Status: DC | PRN

## 2021-07-05 MED ORDER — ONDANSETRON HCL 4 MG PO TABS: 4.0000 mg | ORAL_TABLET | Freq: Four times a day (QID) | ORAL | Status: DC | PRN

## 2021-07-05 MED ORDER — MORPHINE SULFATE (PF) 2 MG/ML IV SOLN
2.0000 mg | INTRAVENOUS | Status: AC | PRN
  Administered 2021-07-06 (×2): 2 mg via INTRAVENOUS
  Filled 2021-07-05 (×2): qty 1

## 2021-07-05 MED ORDER — TAMSULOSIN HCL 0.4 MG PO CAPS
0.4000 mg | ORAL_CAPSULE | Freq: Every day | ORAL | Status: DC
  Administered 2021-07-06 – 2021-07-12 (×8): 0.4 mg via ORAL
  Filled 2021-07-05 (×9): qty 1

## 2021-07-05 MED ORDER — LABETALOL HCL 5 MG/ML IV SOLN
10.0000 mg | Freq: Once | INTRAVENOUS | Status: AC
  Administered 2021-07-05: 10 mg via INTRAVENOUS
  Filled 2021-07-05: qty 4

## 2021-07-05 MED ORDER — ACETAMINOPHEN 325 MG PO TABS
650.0000 mg | ORAL_TABLET | Freq: Four times a day (QID) | ORAL | Status: DC | PRN
  Administered 2021-07-09 – 2021-07-13 (×2): 650 mg via ORAL
  Filled 2021-07-05 (×2): qty 2

## 2021-07-05 MED ORDER — PYRIDOSTIGMINE BROMIDE 60 MG PO TABS
60.0000 mg | ORAL_TABLET | Freq: Three times a day (TID) | ORAL | Status: DC
  Administered 2021-07-06 – 2021-07-13 (×19): 60 mg via ORAL
  Filled 2021-07-05 (×24): qty 1

## 2021-07-05 MED ORDER — ACETAMINOPHEN 650 MG RE SUPP: 650.0000 mg | Freq: Four times a day (QID) | RECTAL | Status: DC | PRN

## 2021-07-05 MED ORDER — MIRTAZAPINE 15 MG PO TABS
30.0000 mg | ORAL_TABLET | Freq: Every day | ORAL | Status: DC
  Administered 2021-07-06 – 2021-07-12 (×7): 30 mg via ORAL
  Filled 2021-07-05 (×8): qty 2

## 2021-07-05 MED ORDER — LACTATED RINGERS IV BOLUS
1000.0000 mL | Freq: Once | INTRAVENOUS | Status: AC
  Administered 2021-07-05: 1000 mL via INTRAVENOUS

## 2021-07-05 NOTE — Telephone Encounter (Signed)
I spoke with Mrs. Scott Guzman. We have discussed possible surgery dates and Tuesday April 18th, 2023 was agreed upon by all parties. Patient given information about surgery date, what to expect pre-operatively and post operatively.  ?We discussed that a Pre-Admission Testing office will be calling to set up the pre-op visit that will take place prior to surgery, and that these appointments are typically done over the phone with a Pre-Admissions RN.  ?Informed patient that our office will communicate any additional care to be provided after surgery. Patients questions or concerns were discussed during our call. Advised to call our office should there be any additional information, questions or concerns that arise. Patient verbalized understanding.  ? ?

## 2021-07-05 NOTE — Assessment & Plan Note (Addendum)
Likely related to altered mentation ?No evidence of acute injury on extensive trauma imaging ?--PT/OT ?

## 2021-07-05 NOTE — Assessment & Plan Note (Addendum)
--  s/p bilateral stent placement by urology ?Plan: ?--cont Flomax ?--d/c Foley today for voiding trial ?--outpatient f/u with urology for stent removal (scheduled for this Friday if pt is discharged before then) ?

## 2021-07-05 NOTE — Assessment & Plan Note (Addendum)
Patient with altered mental status on top of baseline dementia, likely secondary to acute illness, renal colic, AKI  ?--also may have hospital delirium ?Plan: ?--reduce seroquel to '50mg'$  nightly ?--IV haldol PRN ?

## 2021-07-05 NOTE — Assessment & Plan Note (Addendum)
Not on rate control agent at home ?--Hold Xarelto due to hematuria, resume after ureteral stents are removed. ?--cont Toprol (new) ?

## 2021-07-05 NOTE — ED Notes (Signed)
Pt taken to CT.

## 2021-07-05 NOTE — ED Provider Notes (Signed)
? ?[facility] ?Provider Note ? ? ? Event Date/Time  ? First MD Initiated Contact with Patient 07/05/21 1945   ?  (approximate) ? ? ?History  ? ?Altered Mental Status, Fall, and Arm Injury ? ? ?HPI ? ?Scott Guzman is a 81 y.o. male   with a history of kidney stones, some mild dementia, CAD, A-fib on Xarelto, hypertension and kidney stone most recently evaluated emergency room on 4/11 with CT showing 5 mm stone in the left presents EMS from home for evaluation acute alteration of his mental status and at least 2 falls that occurred earlier today per wife.  Patient is altered on arrival and unable find any contributory history.  When asked he is why he is here he says "because I am".  Wife did arrive proximately 30 minutes after patient was up quite some history and states that she saw him for at least once and heard him fall in the garage.  She states that when she saw him fall he seemed to lose his balance but did not hit his head.  She is not sure when he last took Xarelto but thinks it may have been a couple days ago.  She states that he sometimes little confused but typically is able to carry conversation and is more oriented than he is now.  She is not aware of any other sick symptoms such as fevers, cough, vomiting diarrhea or any other sick symptoms.  No other history is immediately available on patient presentation. ? ?  ? ? ?Physical Exam  ?Triage Vital Signs: ?ED Triage Vitals [07/05/21 1933]  ?Enc Vitals Group  ?   BP (!) 193/92  ?   Pulse Rate 64  ?   Resp 18  ?   Temp 98.4 ?F (36.9 ?C)  ?   Temp Source Oral  ?   SpO2 92 %  ?   Weight   ?   Height   ?   Head Circumference   ?   Peak Flow   ?   Pain Score   ?   Pain Loc   ?   Pain Edu?   ?   Excl. in [place]?   ? ? ?Most recent vital signs: ?Vitals:  ? 07/05/21 2200 07/05/21 2230  ?BP: (!) 195/98 (!) 170/88  ?Pulse: 87 83  ?Resp: 15 15  ?Temp:    ?SpO2: 100% 98%  ? ? ?General: Awake, no distress.  ?CV:  Good peripheral perfusion.  2+  radial pulses. ?Resp:  Normal effort.  Clear. ?Abd:  No distention.  Cough. ?Other:  No clear tenderness along the C/T/L-spine.  There is a skin tear over the lateral aspect of the left forearm but no other obvious trauma to the bilateral shoulders, elbows, wrists or hands.  Patient has symmetric grip strength.  PERRLA.  EOMI.  No obvious trauma to the face although patient is not oriented and make some nonsensical comments in response to direct questions.  He is able to move both lower extremities on command and seems to have sensation intact light touch but is not able to identify appropriate extremity patch.  No obvious trauma to the bilateral hips, knees and ankles ? ? ?ED Results / Procedures / Treatments  ?Labs ?(all labs ordered are listed, but only abnormal results are displayed) ?Labs Reviewed  ?COMPREHENSIVE METABOLIC PANEL - Abnormal; Notable for the following components:  ?    Result Value  ? Glucose, Bld 104 (*)   ?  BUN 33 (*)   ? Creatinine, Ser 2.58 (*)   ? Calcium 8.7 (*)   ? GFR, Estimated 25 (*)   ? All other components within normal limits  ?CBC - Abnormal; Notable for the following components:  ? RBC 4.02 (*)   ? Hemoglobin 11.7 (*)   ? HCT 36.7 (*)   ? All other components within normal limits  ?TSH - Abnormal; Notable for the following components:  ? TSH 8.337 (*)   ? All other components within normal limits  ?URINALYSIS, ROUTINE W REFLEX MICROSCOPIC - Abnormal; Notable for the following components:  ? Color, Urine YELLOW (*)   ? APPearance HAZY (*)   ? Hgb urine dipstick LARGE (*)   ? Ketones, ur 5 (*)   ? Leukocytes,Ua SMALL (*)   ? RBC / HPF >50 (*)   ? All other components within normal limits  ?CBG MONITORING, ED - Abnormal; Notable for the following components:  ? Glucose-Capillary 100 (*)   ? All other components within normal limits  ?RESP PANEL BY RT-PCR (FLU A&B, COVID) [id number]  ?URINE CULTURE  ?PROTIME-INR  ?AMMONIA  ?APTT  ?T4, FREE  ? ? ? ?EKG ? ?ECG is remarkable sinus rhythm with  a ventricular rate of 80, normal axis, unremarkable intervals without evidence of acute ischemia or significant arrhythmia. ? ? ?RADIOLOGY ? ?CT head and C-spine on my interpretation without evidence of acute skull fracture, acute ischemia, hemorrhage, mass effect or acute C-spine injury.  I also viewed radiology interpretation and agree with the findings of some mild sinus disease and maxillary right sinus polyp versus mucous retention cyst.  I also agree with findings on CT neck of marked moderate to severe degenerative changes and some stepwise retrolisthesis of C4-3 and C4 and C5 without any acute fracture. ? ?CT chest abdomen pelvis as well as reformats reviewed by myself without evidence of acute hemorrhage or clear acute distal injury.  I also viewed radiology interpretation and agree with their findings of none 5 mm obstructive kidney stone on the left with some mild hydronephrosis.  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 ? ?Plain film of left humerus on my interpretation shows no acute underlying fracture. ? ? ?PROCEDURES: ? ?Critical Care performed: Yes, see critical care procedure note(s) ? ?.Critical Care ?Performed by: Lucrezia Starch, MD ?Authorized by: Lucrezia Starch, MD  ? ?Critical care provider statement:  ?  Critical care time (minutes):  30 ?  Critical care was necessary to treat or prevent imminent or life-threatening deterioration of the following conditions:  CNS failure or compromise ?  Critical care was time spent personally by me on the following activities:  Development of treatment plan with patient or surrogate, discussions with consultants, evaluation of patient's response to treatment, examination of patient, ordering and review of laboratory studies, ordering and review of radiographic studies, ordering and performing treatments and interventions, pulse oximetry, re-evaluation of  patient's condition and review of old charts ?.1-3 Lead EKG Interpretation ?Performed by: Lucrezia Starch, MD ?Authorized by: Lucrezia Starch, MD  ? ?  Interpretation: normal   ?  ECG rate assessment: normal   ?  Rhythm: sinus rhythm   ?  Ectopy: none   ?  Conduction: normal   ? ?The patient is on the cardiac monitor to evaluate for evidence of arrhythmia and/or significant heart rate changes. ? ? ?MEDICATIONS ORDERED IN  ED: ?Medications  ?hydrALAZINE (APRESOLINE) injection 5 mg (has no administration in time range)  ?morphine (PF) 2 MG/ML injection 2 mg (has no administration in time range)  ?iohexol (OMNIPAQUE) 300 MG/ML solution 100 mL (100 mLs Intravenous Contrast Given 07/05/21 2003)  ?lactated ringers bolus 1,000 mL (1,000 mLs Intravenous New Bag/Given 07/05/21 2138)  ?labetalol (NORMODYNE) injection 10 mg (10 mg Intravenous Given 07/05/21 2225)  ? ? ? ?IMPRESSION / MDM / ASSESSMENT AND PLAN / ED COURSE  ?I reviewed the triage vital signs and the nursing notes. ?             ?               ? ?Differential diagnosis includes, but is not limited to acute encephalopathy in the setting of underlying dementia and related to acute infectious process, CVA, SAH versus acute endocrine derangement versus possible development of intracranial injury or other significant visceral injury in the setting of mechanical fall. ? ?Given reported history on EHR patient being on Xarelto and being acutely altered on arrival full trauma pan scans ordered.  I was able to get one from his wife who arrived at bedside little bit later that patient had stopped taking Xarelto couple days ago that she is not exactly sure when.  She is a somewhat poor historian unable to specify exactly when she knows he was different today as it seems that she felt he has been a little bit more confused over the weekend.  She attributes this to recent diagnosis of kidney stone. ? ?CT head and C-spine on my interpretation without evidence of acute skull  fracture, acute ischemia, hemorrhage, mass effect or acute C-spine injury.  I also viewed radiology interpretation and agree with the findings of some mild sinus disease and maxillary right sinus polyp versus

## 2021-07-05 NOTE — Assessment & Plan Note (Addendum)
Could be in part related to pain. Not on home anti-hypertensives ?Plan: ?--cont oral hydralazine 50 mg q8h (new) ?--cont Toprol 50 mg daily (new) ?

## 2021-07-05 NOTE — Progress Notes (Signed)
REQUEST FOR SURGICAL CLEARANCE     ? ? ?Date: Date: 07/05/21 ? ?Faxed to: Dr. Humphrey Rolls ? ?Surgeon: Dr. Bernardo Heater, MD    ? ?Date of Surgery: Tuesday April 18th, 2023 ? ?Operation: Bilateral Ureteroscopy with laser lithotripsy and stent placement ? ?Anesthesia Type: Choice  ? ?Diagnosis: Bilateral Ureteral Stone  ? ?Patient Requires:  ? ?Cardiac / Vascular Clearance : Yes ? ?Reason: to hold Xarelto, and if so for how many days? ?  ? ?Risk Assessment:  ? ? Low   '[]'$       Moderate   '[]'$     High   '[]'$  ?  ?       ?This patient is optimized for surgery  YES '[]'$       NO   '[]'$  ? ? ?I recommend further assessment/workup prior to surgery. YES '[]'$      NO  '[]'$  ? ?Appointment scheduled for: _______________________  ? ?Further recommendations: ____________________________________  ? ? ? ?Physician Signature:__________________________________  ? ?Printed Name: ________________________________________  ? ?Date: _________________   ? ?

## 2021-07-05 NOTE — H&P (Signed)
?History and Physical  ? ? ?Patient: Scott Guzman FIE:[id number] DOB: [birth year] ?DOA: 07/05/2021 ?DOS: the patient was seen and examined on 07/05/2021 ?PCP: Scott Cousins, MD  ?Patient coming from: Home ? ?Chief Complaint:  ?Chief Complaint  ?Patient presents with  ? Altered Mental Status  ? Fall  ? Arm Injury  ? ? ?HPI: Scott Guzman is a 81 y.o. male with medical history significant for CAD, A-fib on amiodarone and Xarelto, dementia, HTN, seen in the ED 2 days ago on 1/66/0630 for renal colic, treated and discharged who was brought to the emergency room for evaluation of confusion and 2 falls, 1 of which was witnessed by his wife.  Patient has had no nausea or vomiting or fever or chills and has not been complaining of pain as he was when he presented 2 days prior. ?ED course and data review: On arrival BP 193/92 with otherwise normal vitals.  Blood work mostly significant for creatinine of 2.58 with history of normal creatinine of 1.22, 5 months ago in October 2022.  Other pertinent labs include normal WBC of 8600, hemoglobin 11.7, down from 13.9 baseline,.  COVID and flu negative.  Urinalysis with large hemoglobin.  EKG, personally viewed and interpreted shows sinus at 80 with no acute ST-T wave changes.  Patient had extensive trauma imaging including CT head, C, T and L-spine, CT chest abdomen and pelvis with contrast, x-ray left humerus and MRI brain.  Findings were significant for a 5 mm obstructive ureteral calculus on the left with hydroureteronephrosis. ?Patient was treated with an IV fluid bolus, given a dose of labetalol.  Hospitalist consulted for admission.  ? ?Review of Systems  ?Unable to perform ROS: Mental status change  ? ?Past Medical History:  ?Diagnosis Date  ? Colon cancer ([place]) 2000  ? He states he had a partial colon resection.   ? Coronary artery disease   ? Dysphagia   ? Dyspnea   ? Dysrhythmia   ? Atrial Fibrillation  ? Hyperlipidemia   ? Hypertension   ? PVC (premature ventricular  contraction)   ? Temporal arteritis ([place])   ? Thyroid disease   ? ?Past Surgical History:  ?Procedure Laterality Date  ? BACK SURGERY    ? Kyphoplasty T12  (Nov. 2018)  ? COLON SURGERY    ? COLONOSCOPY WITH PROPOFOL N/A 05/05/2017  ? Procedure: COLONOSCOPY WITH PROPOFOL;  Surgeon: Manya Silvas, MD;  Location[facility] ENDOSCOPY;  Service: Endoscopy;  Laterality: N/A;  ? KYPHOPLASTY N/A 01/23/2017  ? Procedure: [id number];  Surgeon: [name], MD;  Location: [facility] ORS;  Service: Orthopedics;  Laterality: N/A;  ? LEFT HEART CATH AND CORONARY ANGIOGRAPHY Right 12/03/2018  ? Procedure: LEFT HEART CATH AND CORONARY ANGIOGRAPHY;  Surgeon: [name], MD;  Location: [facility];  Service: Cardiovascular;  Laterality: Right;  ? ?Social History:  reports that he has never smoked. He has never used smokeless tobacco. He reports current alcohol use of about 4.0 standard drinks per week. He reports that he does not use drugs. ? ?No Known Allergies ? ?Family History  ?Problem Relation Age of Onset  ? Colon cancer Brother   ? ? ?Prior to Admission medications   ?Medication Sig Start Date End Date Taking? Authorizing Provider  ?albuterol (VENTOLIN HFA) 108 (90 Base) MCG/ACT inhaler Inhale 2 puffs into the lungs every 6 (six) hours as needed for wheezing or shortness of breath. 10/27/20   Vigg, Avanti, MD  ?amiodarone (PACERONE) 200 MG tablet Take  100 mg by mouth daily. 01/24/21   [provider]  ?docusate sodium (COLACE) 50 MG capsule Take 1 capsule (50 mg total) by mouth daily as needed for mild constipation. 10/27/20   Vigg, Avanti, MD  ?methimazole (TAPAZOLE) 10 MG tablet Take 5 mg by mouth every other day. 01/06/21   [provider]  ?mirtazapine (REMERON) 30 MG tablet Take 1 tablet (30 mg total) by mouth at bedtime. Please schedule office visit before any future refills. 09/05/20   Vigg, Avanti, MD  ?naproxen (NAPROSYN) 500 MG tablet Take 1 tablet (500 mg total) by mouth 2 (two) times daily  with a meal. 07/03/21   Lavonia Drafts, MD  ?nitroGLYCERIN (NITROSTAT) 0.4 MG SL tablet Place 1 tablet (0.4 mg total) under the tongue every 5 (five) minutes x 3 doses as needed for chest pain. 06/21/21 06/21/22  Harvest Dark, MD  ?ondansetron (ZOFRAN-ODT) 4 MG disintegrating tablet Take 1 tablet (4 mg total) by mouth every 8 (eight) hours as needed for nausea or vomiting. 07/03/21   Lavonia Drafts, MD  ?pantoprazole (PROTONIX) 40 MG tablet Take 1 tablet (40 mg total) by mouth daily. 06/21/21 06/21/22  Harvest Dark, MD  ?polyethylene glycol powder (GLYCOLAX/MIRALAX) 17 GM/SCOOP powder Take by mouth.    [provider]  ?predniSONE (DELTASONE) 20 MG tablet Take 20 mg by mouth 2 (two) times daily. 08/31/20   [provider]  ?pyridostigmine (MESTINON) 60 MG tablet TAKE 1/2 TO 1 TABLET BY MOUTH 3 TIMES A DAY. Call [phone] to schedule follow up for future refills 01/23/21   Sater, [name], MD  ?Rivaroxaban (XARELTO) 15 MG TABS tablet Take 15 mg by mouth daily with supper.    [provider]  ?tamsulosin (FLOMAX) 0.4 MG CAPS capsule Take 1 capsule (0.4 mg total) by mouth daily. 07/03/21   Lavonia Drafts, MD  ?traMADol (ULTRAM) 50 MG tablet Take 1 tablet (50 mg total) by mouth every 6 (six) hours as needed. 07/03/21 07/03/22  Lavonia Drafts, MD  ? ? ?Physical Exam: ?Vitals:  ? 07/05/21 2030 07/05/21 2100 07/05/21 2200 07/05/21 2230  ?BP: (!) 193/90 (!) 196/98 (!) 195/98 (!) 170/88  ?Pulse: 90 82 87 83  ?Resp: '14 17 15 15  '$ ?Temp:      ?TempSrc:      ?SpO2: 97% 94% 100% 98%  ? ?Physical Exam ?Vitals and nursing note reviewed.  ?Constitutional:   ?   General: He is not in acute distress. ?   Comments: confused  ?HENT:  ?   Head: Normocephalic and atraumatic.  ?Cardiovascular:  ?   Rate and Rhythm: Normal rate and regular rhythm.  ?   Pulses: Normal pulses.  ?   Heart sounds: Normal heart sounds.  ?Pulmonary:  ?   Effort: Pulmonary effort is normal.  ?   Breath sounds: Normal breath sounds.   ?Abdominal:  ?   Palpations: Abdomen is soft.  ?   Tenderness: There is no abdominal tenderness.  ?Neurological:  ?   Mental Status: Mental status is at baseline.  ? ? ? ?Data Reviewed: ?Relevant notes from primary care and specialist visits, past discharge summaries as available in EHR, including Care Everywhere. ?Prior diagnostic testing as pertinent to current admission diagnoses ?Updated medications and problem lists for reconciliation ?ED course, including vitals, labs, imaging, treatment and response to treatment ?Triage notes, nursing and pharmacy notes and ED provider's notes ?Notable results as noted in HPI ? ? ?Assessment and Plan: ?Hydronephrosis with urinary obstruction due to ureteral calculus,  left ?Flomax and urology consult ?Pain control ?We will keep n.p.o. and hold Xarelto in case of procedure in the a.m. ? ?AKI (acute kidney injury) ([place]) ?Postrenal secondary to obstructive calculus in left ureter ?Flomax, IV hydration and monitor renal function ?Urology consult to follow ? ?Acute metabolic encephalopathy ?Patient with altered mental status, likely secondary to acute illness, renal colic, AKI superimposed on baseline of dementia. ?Neurologic checks and fall and aspiration precautions.  We will keep n.p.o. tonight ?Treat acute illness as outlined below ? ?Hypertensive urgency ?Could be in part related to pain ?Pain control ?As needed antihypertensive ? ?Chronic anticoagulation ?Continue Xarelto ? ?Falls, initial encounter ?Likely related to altered mentation ?Fall precautions ?No evidence of acute injury on extensive trauma imaging ? ?Anemia ?Hemoglobin 11.7, down from baseline of 13.9.  Possibly hemodilutional from hydration in the ED on 4/11 as well as possible blood loss from hematuria ?Continue to monitor ? ?Atrial fibrillation with controlled ventricular response ([place]) ?Continue amiodarone and Xarelto ? ?Dementia ([place]) ?Delirium precautions ?Continue mirtazapine ? ?Coronary artery disease ?No  complaints of chest pain and EKG nonacute.  ?Not currently on beta-blocker or aspirin likely due to being on amiodarone and rivaroxaban ?Continue nitroglycerin as needed ? ? ? ? ? ? ?Advance Care Planning:   Code

## 2021-07-05 NOTE — Assessment & Plan Note (Signed)
No complaints of chest pain and EKG nonacute.  ?Not currently on beta-blocker or aspirin likely due to being on amiodarone and rivaroxaban ?Continue nitroglycerin as needed ?

## 2021-07-05 NOTE — ED Triage Notes (Signed)
Pt has suffered multiple falls today. Pt denies any pain but is altered to place and time. Denies head or neck pain upon palpation and does not show any focal weakness at this time.  ?

## 2021-07-05 NOTE — Assessment & Plan Note (Addendum)
Postrenal secondary to obstructive calculus in left ureter ?--Cr 2.58 on presentation, improved to baseline 1.1. ?Urology consulted ?Plan: ?--d/c MIVF today ?

## 2021-07-05 NOTE — ED Notes (Signed)
Pt taken to MRI  

## 2021-07-05 NOTE — Assessment & Plan Note (Addendum)
Hold Xarelto due to hematuria, resume after ureteral stents are removed. ?

## 2021-07-05 NOTE — Telephone Encounter (Signed)
?  Chief Complaint: fall, skin tear ?Symptoms: skin tear to upper arm 1.5-2" ?Frequency: today ?Pertinent Negatives: Patient denies pain ?Disposition: '[]'$ ED /'[x]'$ Urgent Care (no appt availability in office) / '[]'$ Appointment(In office/virtual)/ '[]'$  [facility]/ '[]'$ Home Care/ '[]'$ Refused Recommended Disposition /'[]'$ [facility]/ '[]'$  Follow-up with PCP ?Additional Notes: pt's wife called in was trying to get appt for pt to come in and have skin tear cleaned up and banaged. I advised him we didn't have any appts this afternoon but had some in the morning. She states she didn't want to wait that long and would take him to UC to have it cleaned up. I advised her that UC at [facility] only had 12 min wait and she states she would go ahead and take him there. I let her know I would pass this along to Dr. Neomia Dear.  ? ?Reason for Disposition ? [1] Caller has URGENT question AND [2] triager unable to answer question ? ?Answer Assessment - Initial Assessment Questions ?1. MECHANISM: "How did the fall happen?" ?    Moving things around in garage ?3. ONSET: "When did the fall happen?" (e.g., minutes, hours, or days ago) ?    today ?4. LOCATION: "What part of the body hit the ground?" (e.g., back, buttocks, head, hips, knees, hands, head, stomach) ?    Upper forearm near shoulder  ?5. INJURY: "Did you hurt (injure) yourself when you fell?" If Yes, ask: "What did you injure? Tell me more about this?" (e.g., body area; type of injury; pain severity)" ?    Skin tears ?6. PAIN: "Is there any pain?" If Yes, ask: "How bad is the pain?" (e.g., Scale 1-10; or mild,  ?moderate, severe) ?  - NONE (0): No pain ?  - MILD (1-3): Doesn't interfere with normal activities  ?  - MODERATE (4-7): Interferes with normal activities or awakens from sleep  ?  - SEVERE (8-10): Excruciating pain, unable to do any normal activities  ?    no ?7. SIZE: For cuts, bruises, or swelling, ask: "How large is it?" (e.g., inches or centimeters)  ?    1.5 -  2 ins ? ?Protocols used: Falls and Falling-A-AH ? ?

## 2021-07-05 NOTE — Progress Notes (Signed)
Surgical Physician Order Form [facility] ? ?* Scheduling expectation :  07/10/2021 ? ?*Length of Case: 1 hour ? ?*Clearance needed: yes ? ?*Anticoagulation Instructions: Hold all anticoagulants ? ?*Aspirin Instructions: N/A ? ?*Post-op visit Date/Instructions:  1 week cysto stent removal ? ?*Diagnosis:  Bilateral ureteral calculi ? ?*Procedure: Bilateral Ureteroscopy w/laser lithotripsy & stent placement (47308) ? ? ?Additional orders: N/A ? ?-Admit type: OUTpatient ? ?-Anesthesia: Choice ? ?-VTE Prophylaxis Standing Order SCD?s    ?   ?Other:  ? ?-Standing Lab Orders Per Anesthesia   ? ?Lab other: UA&Urine Culture to [facility] lab ? ?-Standing Test orders EKG/Chest x-ray per Anesthesia      ? ?Test other:  ? ?- Medications:  Ancef 2gm IV ? ?-Other orders:  N/A ? ? ? ?  ? ?

## 2021-07-05 NOTE — Assessment & Plan Note (Signed)
Hemoglobin 11.7, down from baseline of 13.9.  Possibly hemodilutional from hydration in the ED on 4/11 as well as possible blood loss from hematuria ?Continue to monitor ?

## 2021-07-05 NOTE — Assessment & Plan Note (Signed)
Delirium precautions ?Continue mirtazapine ?

## 2021-07-05 NOTE — Progress Notes (Signed)
[facility] Posting Form  ? ?Surgery Date/Time: Date: 07/10/2021 ? ?Surgeon: Dr. John Giovanni, MD ? ?Surgery Location: Day Surgery ? ?Inpt ( No  )   Outpt (Yes)   Obs ( No  )  ? ?Diagnosis: N20.1 Bilateral Ureteral Stone ? ?-CPT: 37628 ? ?Surgery: Bilateral Ureteroscopy with laser lithotripsy and stent placement  ? ?Stop Anticoagulations: Yes ? ?Cardiac/Medical/Pulmonary Clearance needed: Yes ? ?Clearance needed from Dr: Dr. Humphrey Rolls ? ?Clearance request sent on: Date: 07/05/21 ? ? ?*Orders entered into EPIC  Date: 07/05/21  ? ?*Case booked in [place]  Date: 07/05/21 ? ?*Notified pt of Surgery: Date: 07/05/21 ? ?PRE-OP UA & CX: Yes, obtained today 07/05/2021 ? ?*Placed into Prior Authorization Work Fabio Bering Date: 07/05/21 ? ? ?Assistant/laser/rep:No ? ? ? ? ? ? ? ? ? ? ? ? ? ? ? ?

## 2021-07-06 ENCOUNTER — Inpatient Hospital Stay: Payer: Medicare Other

## 2021-07-06 ENCOUNTER — Encounter
Admission: EM | Disposition: A | Discharge status: Skilled Nursing Facility | Payer: Self-pay | Source: Home / Self Care | Attending: Hospitalist

## 2021-07-06 ENCOUNTER — Inpatient Hospital Stay: Payer: Medicare Other | Admitting: Certified Registered Nurse Anesthetist

## 2021-07-06 ENCOUNTER — Encounter: Payer: Self-pay | Admitting: Urology

## 2021-07-06 ENCOUNTER — Encounter: Payer: Self-pay | Admitting: Internal Medicine

## 2021-07-06 ENCOUNTER — Ambulatory Visit: Admit: 2021-07-06 | Payer: Medicare Other | Admitting: Urology

## 2021-07-06 ENCOUNTER — Other Ambulatory Visit: Payer: Self-pay

## 2021-07-06 DIAGNOSIS — N1330 Unspecified hydronephrosis: Secondary | ICD-10-CM

## 2021-07-06 DIAGNOSIS — N179 Acute kidney failure, unspecified: Secondary | ICD-10-CM

## 2021-07-06 DIAGNOSIS — N201 Calculus of ureter: Secondary | ICD-10-CM

## 2021-07-06 DIAGNOSIS — G9341 Metabolic encephalopathy: Secondary | ICD-10-CM | POA: Diagnosis not present

## 2021-07-06 HISTORY — PX: CYSTOSCOPY/URETEROSCOPY/HOLMIUM LASER/STENT PLACEMENT: SHX6546

## 2021-07-06 LAB — CBC
HCT: 41.3 % (ref 39.0–52.0)
Hemoglobin: 13.3 g/dL (ref 13.0–17.0)
MCH: 28.9 pg (ref 26.0–34.0)
MCHC: 32.2 g/dL (ref 30.0–36.0)
MCV: 89.6 fL (ref 80.0–100.0)
Platelets: 189 10*3/uL (ref 150–400)
RBC: 4.61 MIL/uL (ref 4.22–5.81)
RDW: 13.2 % (ref 11.5–15.5)
WBC: 9.4 10*3/uL (ref 4.0–10.5)
nRBC: 0 % (ref 0.0–0.2)

## 2021-07-06 LAB — BASIC METABOLIC PANEL
Anion gap: 8 (ref 5–15)
Anion gap: 10 (ref 5–15)
BUN: 28 mg/dL — ABNORMAL HIGH (ref 8–23)
BUN: 22 mg/dL (ref 8–23)
CO2: 27 mmol/L (ref 22–32)
CO2: 23 mmol/L (ref 22–32)
Calcium: 8.7 mg/dL — ABNORMAL LOW (ref 8.9–10.3)
Calcium: 8.1 mg/dL — ABNORMAL LOW (ref 8.9–10.3)
Chloride: 101 mmol/L (ref 98–111)
Chloride: 101 mmol/L (ref 98–111)
Creatinine, Ser: 2.39 mg/dL — ABNORMAL HIGH (ref 0.61–1.24)
Creatinine, Ser: 1.76 mg/dL — ABNORMAL HIGH (ref 0.61–1.24)
GFR, Estimated: 27 mL/min — ABNORMAL LOW (ref >60)
GFR, Estimated: 39 mL/min — ABNORMAL LOW (ref >60)
Glucose, Bld: 142 mg/dL — ABNORMAL HIGH (ref 70–99)
Glucose, Bld: 119 mg/dL — ABNORMAL HIGH (ref 70–99)
Potassium: 4.3 mmol/L (ref 3.5–5.1)
Potassium: 4.1 mmol/L (ref 3.5–5.1)
Sodium: 136 mmol/L (ref 135–145)
Sodium: 134 mmol/L — ABNORMAL LOW (ref 135–145)

## 2021-07-06 SURGERY — CYSTOSCOPY/URETEROSCOPY/HOLMIUM LASER/STENT PLACEMENT
Anesthesia: General | Laterality: Bilateral

## 2021-07-06 MED ORDER — EPHEDRINE SULFATE (PRESSORS) 50 MG/ML IJ SOLN
INTRAMUSCULAR | Status: DC | PRN
  Administered 2021-07-06: 5 mg via INTRAVENOUS
  Administered 2021-07-06: 2.5 mg via INTRAVENOUS
  Administered 2021-07-06 (×3): 5 mg via INTRAVENOUS
  Administered 2021-07-06: 2.5 mg via INTRAVENOUS

## 2021-07-06 MED ORDER — DOCUSATE SODIUM 50 MG/5ML PO LIQD
50.0000 mg | Freq: Every day | ORAL | Status: DC | PRN
  Filled 2021-07-06 (×2): qty 10

## 2021-07-06 MED ORDER — GLYCOPYRROLATE 0.2 MG/ML IJ SOLN
INTRAMUSCULAR | Status: AC
  Filled 2021-07-06: qty 1

## 2021-07-06 MED ORDER — LIDOCAINE HCL (CARDIAC) PF 100 MG/5ML IV SOSY
PREFILLED_SYRINGE | INTRAVENOUS | Status: DC | PRN
  Administered 2021-07-06: 80 mg via INTRAVENOUS

## 2021-07-06 MED ORDER — PANTOPRAZOLE SODIUM 40 MG PO TBEC
40.0000 mg | DELAYED_RELEASE_TABLET | Freq: Every day | ORAL | Status: DC
  Administered 2021-07-06 – 2021-07-13 (×6): 40 mg via ORAL
  Filled 2021-07-06 (×7): qty 1

## 2021-07-06 MED ORDER — DEXAMETHASONE SODIUM PHOSPHATE 10 MG/ML IJ SOLN
INTRAMUSCULAR | Status: AC
  Filled 2021-07-06: qty 3

## 2021-07-06 MED ORDER — IOHEXOL 180 MG/ML  SOLN
INTRAMUSCULAR | Status: DC | PRN
  Administered 2021-07-06: 15 mL

## 2021-07-06 MED ORDER — ONDANSETRON HCL 4 MG/2ML IJ SOLN
INTRAMUSCULAR | Status: AC
  Filled 2021-07-06: qty 6

## 2021-07-06 MED ORDER — RIVAROXABAN 15 MG PO TABS
15.0000 mg | ORAL_TABLET | Freq: Every day | ORAL | Status: DC
Start: 2021-07-07 — End: 2021-07-08
  Administered 2021-07-07: 15 mg via ORAL
  Filled 2021-07-06: qty 1

## 2021-07-06 MED ORDER — LORAZEPAM 2 MG/ML IJ SOLN
0.5000 mg | Freq: Two times a day (BID) | INTRAMUSCULAR | Status: DC | PRN
  Administered 2021-07-06 – 2021-07-10 (×5): 0.5 mg via INTRAVENOUS
  Filled 2021-07-06 (×5): qty 1

## 2021-07-06 MED ORDER — SODIUM CHLORIDE 0.9 % IR SOLN
Status: DC | PRN
  Administered 2021-07-06: 3000 mL

## 2021-07-06 MED ORDER — LIDOCAINE HCL (PF) 2 % IJ SOLN
INTRAMUSCULAR | Status: AC
  Filled 2021-07-06: qty 10

## 2021-07-06 MED ORDER — HALOPERIDOL LACTATE 5 MG/ML IJ SOLN
2.0000 mg | Freq: Four times a day (QID) | INTRAMUSCULAR | Status: DC | PRN
  Administered 2021-07-06 – 2021-07-10 (×9): 2 mg via INTRAVENOUS
  Filled 2021-07-06 (×9): qty 1

## 2021-07-06 MED ORDER — ACETAMINOPHEN 10 MG/ML IV SOLN
INTRAVENOUS | Status: DC | PRN
Start: 2021-07-06 — End: 2021-07-06
  Administered 2021-07-06: 1000 mg via INTRAVENOUS

## 2021-07-06 MED ORDER — LORAZEPAM 2 MG/ML IJ SOLN: 0.5000 mg | Freq: Four times a day (QID) | INTRAMUSCULAR | Status: DC | PRN

## 2021-07-06 MED ORDER — PROPOFOL 10 MG/ML IV BOLUS
INTRAVENOUS | Status: DC | PRN
  Administered 2021-07-06: 90 mg via INTRAVENOUS

## 2021-07-06 MED ORDER — FENTANYL CITRATE (PF) 100 MCG/2ML IJ SOLN: 25.0000 ug | INTRAMUSCULAR | Status: DC | PRN

## 2021-07-06 MED ORDER — HYDRALAZINE HCL 20 MG/ML IJ SOLN
10.0000 mg | Freq: Four times a day (QID) | INTRAMUSCULAR | Status: DC | PRN
  Administered 2021-07-06: 10 mg via INTRAVENOUS
  Filled 2021-07-06: qty 1

## 2021-07-06 MED ORDER — DOCUSATE SODIUM 50 MG PO CAPS
50.0000 mg | ORAL_CAPSULE | Freq: Every day | ORAL | Status: DC | PRN
  Filled 2021-07-06: qty 1

## 2021-07-06 MED ORDER — ONDANSETRON HCL 4 MG/2ML IJ SOLN
INTRAMUSCULAR | Status: DC | PRN
  Administered 2021-07-06: 4 mg via INTRAVENOUS

## 2021-07-06 MED ORDER — CEFAZOLIN SODIUM-DEXTROSE 2-4 GM/100ML-% IV SOLN
2.0000 g | INTRAVENOUS | Status: AC
  Administered 2021-07-06: 2 g via INTRAVENOUS

## 2021-07-06 MED ORDER — FENTANYL CITRATE (PF) 100 MCG/2ML IJ SOLN
INTRAMUSCULAR | Status: DC | PRN
  Administered 2021-07-06: 25 ug via INTRAVENOUS

## 2021-07-06 MED ORDER — ONDANSETRON HCL 4 MG/2ML IJ SOLN: 4.0000 mg | Freq: Once | INTRAMUSCULAR | Status: DC | PRN

## 2021-07-06 MED ORDER — CHLORHEXIDINE GLUCONATE CLOTH 2 % EX PADS
6.0000 | MEDICATED_PAD | Freq: Every day | CUTANEOUS | Status: DC
  Administered 2021-07-09 – 2021-07-10 (×2): 6 via TOPICAL

## 2021-07-06 MED ORDER — CEFAZOLIN SODIUM-DEXTROSE 2-4 GM/100ML-% IV SOLN
INTRAVENOUS | Status: AC
  Filled 2021-07-06: qty 100

## 2021-07-06 MED ORDER — EPHEDRINE 5 MG/ML INJ
INTRAVENOUS | Status: AC
  Filled 2021-07-06: qty 20

## 2021-07-06 MED ORDER — PHENYLEPHRINE 40 MCG/ML (10ML) SYRINGE FOR IV PUSH (FOR BLOOD PRESSURE SUPPORT)
PREFILLED_SYRINGE | INTRAVENOUS | Status: DC | PRN
  Administered 2021-07-06 (×6): 80 ug via INTRAVENOUS

## 2021-07-06 MED ORDER — TRAMADOL HCL 50 MG PO TABS
50.0000 mg | ORAL_TABLET | Freq: Four times a day (QID) | ORAL | Status: DC | PRN
  Administered 2021-07-08: 50 mg via ORAL
  Filled 2021-07-06: qty 1

## 2021-07-06 MED ORDER — ACETAMINOPHEN 10 MG/ML IV SOLN
INTRAVENOUS | Status: AC
  Filled 2021-07-06: qty 100

## 2021-07-06 MED ORDER — FENTANYL CITRATE (PF) 100 MCG/2ML IJ SOLN
INTRAMUSCULAR | Status: AC
  Filled 2021-07-06: qty 2

## 2021-07-06 MED ORDER — PHENYLEPHRINE HCL-NACL 20-0.9 MG/250ML-% IV SOLN
INTRAVENOUS | Status: DC | PRN
  Administered 2021-07-06: 50 ug/min via INTRAVENOUS

## 2021-07-06 SURGICAL SUPPLY — 29 items
BAG DRAIN CYSTO-URO LG1000N (MISCELLANEOUS) ×2 IMPLANT
BAG URO DRAIN 4000ML (MISCELLANEOUS) ×1 IMPLANT
BASKET ZERO TIP 1.9FR (BASKET) ×1 IMPLANT
BRUSH SCRUB EZ 1% IODOPHOR (MISCELLANEOUS) ×2 IMPLANT
CATH FOL 2WAY LX 16X30 (CATHETERS) ×1 IMPLANT
CATH URET FLEX-TIP 2 LUMEN 10F (CATHETERS) IMPLANT
CATH URETL OPEN END 6X70 (CATHETERS) ×1 IMPLANT
CNTNR SPEC 2.5X3XGRAD LEK (MISCELLANEOUS) ×1
CONT SPEC 4OZ STER OR WHT (MISCELLANEOUS) ×1
CONT SPEC 4OZ STRL OR WHT (MISCELLANEOUS) ×1
CONTAINER SPEC 2.5X3XGRAD LEK (MISCELLANEOUS) IMPLANT
DRAPE UTILITY 15X26 TOWEL STRL (DRAPES) ×2 IMPLANT
GLOVE SURG UNDER POLY LF SZ7.5 (GLOVE) ×2 IMPLANT
GOWN STRL REUS W/ TWL LRG LVL3 (GOWN DISPOSABLE) ×1 IMPLANT
GOWN STRL REUS W/ TWL XL LVL3 (GOWN DISPOSABLE) ×1 IMPLANT
GOWN STRL REUS W/TWL LRG LVL3 (GOWN DISPOSABLE) ×2
GOWN STRL REUS W/TWL XL LVL3 (GOWN DISPOSABLE) ×2
GUIDEWIRE STR DUAL SENSOR (WIRE) ×2 IMPLANT
IV NS IRRIG 3000ML ARTHROMATIC (IV SOLUTION) ×2 IMPLANT
KIT TURNOVER CYSTO (KITS) ×2 IMPLANT
PACK CYSTO AR (MISCELLANEOUS) ×2 IMPLANT
SET CYSTO W/LG BORE CLAMP LF (SET/KITS/TRAYS/PACK) ×2 IMPLANT
SHEATH URETERAL 12FRX35CM (MISCELLANEOUS) IMPLANT
STENT URET 6FRX24 CONTOUR (STENTS) IMPLANT
STENT URET 6FRX26 CONTOUR (STENTS) ×2 IMPLANT
SURGILUBE 2OZ TUBE FLIPTOP (MISCELLANEOUS) ×2 IMPLANT
TRACTIP FLEXIVA PULSE ID 200 (Laser) ×2 IMPLANT
VALVE UROSEAL ADJ ENDO (VALVE) IMPLANT
WATER STERILE IRR 500ML POUR (IV SOLUTION) ×2 IMPLANT

## 2021-07-06 NOTE — Care Management (Signed)
Patient was in procedure, RNCM not able to see him today. ?

## 2021-07-06 NOTE — Anesthesia Procedure Notes (Signed)
Procedure Name: LMA Insertion ?Date/Time: 07/06/2021 12:43 PM ?Performed by: Lily Peer, Serjio Deupree, CRNA ?Pre-anesthesia Checklist: Patient identified, Emergency Drugs available, Suction available and Patient being monitored ?Patient Re-evaluated:Patient Re-evaluated prior to induction ?Oxygen Delivery Method: Circle system utilized ?Preoxygenation: Pre-oxygenation with 100% oxygen ?Induction Type: IV induction ?Ventilation: Mask ventilation without difficulty ?LMA: LMA inserted ?LMA Size: 4.0 ?Number of attempts: 1 ?Placement Confirmation: positive ETCO2 and breath sounds checked- equal and bilateral ?Tube secured with: Tape ?Dental Injury: Teeth and Oropharynx as per pre-operative assessment  ? ? ? ? ?

## 2021-07-06 NOTE — Progress Notes (Signed)
This RN attempted to reach out to the patient's wife, Agostino Gorin, to give her an update on the patient and the addition of the bilateral soft wrist restraints. Unfortunately Mrs. Adinolfi did not answer my call. I left a voicemail with my direct number as well as the unit number when she is available to call back. ?

## 2021-07-06 NOTE — Op Note (Signed)
Preoperative diagnosis:  ?Left distal ureteral calculus with obstruction ?Nonobstructing right distal ureteral calculus ? ?Postoperative diagnosis:  ?Same ? ?Procedure: ?Left ureteroscopy with stone removal ?Left ureteral stent placement ?Right ureteroscopy with stone removal ?Right ureteral stent placement ?Laser lithotripsy of ureteral calculi ?Retrograde pyelogram-bilateral ?Intraoperative fluoroscopy <30 minutes ? ?Surgeon: Abbie Sons, MD ? ?Anesthesia: General ? ?Complications: None ? ?Intraoperative findings:  ?Cystoscopy-urethra normal in caliber without stricture.  Prostate with mild lateral lobe enlargement and mild bladder neck elevation moderate bladder trabeculation without erythema, solid or papillary lesions.  UOs normal-appearing bilaterally ?Left ureteroscopy-calculus identified in the distal ureter ?Right ureteroscopy-calculus identified in the distal ureter ?Left retrograde pyelogram-moderate hydronephrosis/hydroureter.  No contrast extravasation post procedure ?Right retrograde pyelogram-no hydronephrosis or hydroureter.  No contrast extravasation post procedure ? ?EBL: Minimal ? ?Specimens: Ureteral calculus fragments for analysis ? ?Indication: Scott Guzman is a 81 y.o. male seen earlier this week with left renal colic.  CT showed a 5 mm obstructing left distal ureteral calculus in addition to a nonobstructing right distal ureteral calculus.  He was seen in the office on 4/13 and scheduled for bilateral ureteroscopic stone removal on 4/18 however presented to the ED last night with multiple falls and altered mental status.  Slight increase in creatinine.  No leukocytosis or evidence of infection.  After reviewing the management options for treatment with his wife, she elected to proceed with the above surgical procedure(s). We have discussed the potential benefits and risks of the procedure, side effects of the proposed treatment, the likelihood of the patient achieving the goals of the  procedure, and any potential problems that might occur during the procedure or recuperation. Informed consent has been obtained. ? ?Description of procedure: ? ?The patient was taken to the operating room and general anesthesia was induced.  The patient was placed in the dorsal lithotomy position, prepped and draped in the usual sterile fashion, and preoperative antibiotics were administered. A preoperative time-out was performed.  ? ?A 21 French cystoscope was lubricated, passed per urethra and advanced proximally under direct vision with findings as described above.  The bladder was noted to be distended upon introduction of the cystoscope in the bladder ? ?A 0.038 Sensor wire was placed through the cystoscope and positioned at the left UO.  The wire would not advance beyond the calculus in the distal ureter.  A 6 French open-ended ureteral catheter was placed over the wire and positioned in the distal ureter and the wire would not successfully advance. ? ?A 0.038 Zip wire was then placed through the ureteral catheter and was able to be negotiated past the calculus and advanced into the renal pelvis.  The ureteral catheter was advanced over the wire to the region of the renal pelvis.  The guidewire was removed and clear urine was aspirated and sent for culture. ? ?The Sensor wire was placed in the ureteral catheter and the catheter was removed.  A 4.5 French semirigid ureteroscope was passed per urethra.  The scope was advanced into the left ureter without difficulty and the calculus was identified ~ 2 cm in. ? ?The calculus was dusted with a 242 ?m holmium laser fiber at a setting of 0.3J/40 Hz.  Somewhat larger fragments which were chipped off the stone during dusting were placed in a 1.9 French nitinol basket and removed.  The scope was advanced to the lower proximal ureter and no additional fragments were identified.  Retrograde pyelogram was performed through the ureteroscope with findings as described  above. ? ?A 4F/26 cm Contour ureteral stent was then placed under fluoroscopic guidance with a good curl seen in the renal pelvis and bladder under fluoroscopy. ? ?The ureteroscope was then readvanced and positioned at the right UO.  The Sensor wire was placed through the ureteroscope and into the right UO and advanced into the renal pelvis under fluoroscopic guidance without difficulty.  The ureteroscope was removed and readvanced alongside the guidewire into the distal ureter.  The calculus was identified in the midportion of the distal ureter. ? ?The calculus was dusted in a similar fashion with larger fragments removed with a 1.9 [place] nitinol basket.  No significant stone fragments were identified on advancement of the scope to the lower proximal ureter.  Retrograde pyelogram was then performed with findings as described above. ? ?A 4F/26 cm Contour ureteral stent was then placed in the right ureter under fluoroscopic guidance with a good curl seen in the renal pelvis and bladder. ? ?Because of bladder distention identified on initial cystoscopy placement a 16 French Foley catheter was placed to gravity drainage. ? ?Recommendation: ?Stent removal scheduled in office in 7-10 days ?May remove catheter in a.m. but will check a bladder scan to assess for urinary retention 4-6 hours after catheter removal ? ? ?Abbie Sons, M.D. ? ?

## 2021-07-06 NOTE — Anesthesia Preprocedure Evaluation (Signed)
Anesthesia Evaluation  ?Patient identified by MRN, date of birth, ID band ?Patient awake ? ? ? ?Reviewed: ?Allergy & Precautions, H&P , NPO status , Patient's Chart, lab work & pertinent test results, reviewed documented beta blocker date and time  ? ?Airway ?Mallampati: II ? ?TM Distance: >3 FB ?Neck ROM: full ? ? ? Dental ? ?(+) Teeth Intact ?  ?Pulmonary ?shortness of breath and with exertion,  ?  ?Pulmonary exam normal ? ? ? ? ? ? ? Cardiovascular ?Exercise Tolerance: Poor ?hypertension, On Medications ?+ CAD and + DOE  ?Normal cardiovascular exam+ dysrhythmias  ?Rhythm:regular Rate:Normal ? ? ?  ?Neuro/Psych ? Headaches, PSYCHIATRIC DISORDERS Dementia   ? GI/Hepatic ?negative GI ROS, Neg liver ROS,   ?Endo/Other  ?Hyperthyroidism  ? Renal/GU ?Renal disease  ?negative genitourinary ?  ?Musculoskeletal ? ? Abdominal ?  ?Peds ? Hematology ? ?(+) Blood dyscrasia, anemia ,   ?Anesthesia Other Findings ?Past Medical History: ?2000: Colon cancer ([place]) ?    Comment:  He states he had a partial colon resection.  ?No date: Coronary artery disease ?No date: Dysphagia ?No date: Dyspnea ?No date: Dysrhythmia ?    Comment:  Atrial Fibrillation ?No date: Hyperlipidemia ?No date: Hypertension ?No date: PVC (premature ventricular contraction) ?No date: Temporal arteritis ([place]) ?No date: Thyroid disease ?Past Surgical History: ?No date: BACK SURGERY ?    Comment:  Kyphoplasty T12  (Nov. 2018) ?No date: COLON SURGERY ?05/05/2017: COLONOSCOPY WITH PROPOFOL; N/A ?    Comment:  Procedure: COLONOSCOPY WITH PROPOFOL;  Surgeon: Vira Agar, ?             Gavin Pound, MD;  Location: [facility] ENDOSCOPY;  Service:  ?             Endoscopy;  Laterality: N/A; ?01/23/2017: KYPHOPLASTY; N/A ?    Comment:  Procedure: [id number];  Surgeon: [name], MD; ?             Location: [facility] ORS;  Service: Orthopedics;  Laterality:  ?             N/A; ?12/03/2018: LEFT HEART CATH AND CORONARY ANGIOGRAPHY; Right ?    Comment:   Procedure: LEFT HEART CATH AND CORONARY ANGIOGRAPHY;   ?             Surgeon: [name], MD;  Location: [facility] ?             LAB;  Service: Cardiovascular;  Laterality: Right; ?BMI   ? Body Mass Index: 19.92 kg/m?  ?  ? Reproductive/Obstetrics ?negative OB ROS ? ?  ? ? ? ? ? ? ? ? ? ? ? ? ? ?  ?  ? ? ? ? ? ? ? ? ?Anesthesia Physical ?Anesthesia Plan ? ?ASA: 4 and emergent ? ?Anesthesia Plan: General ETT  ? ?Post-op Pain Management:   ? ?Induction:  ? ?PONV Risk Score and Plan: 3 ? ?Airway Management Planned:  ? ?Additional Equipment:  ? ?Intra-op Plan:  ? ?Post-operative Plan:  ? ?Informed Consent: I have reviewed the patients History and Physical, chart, labs and discussed the procedure including the risks, benefits and alternatives for the proposed anesthesia with the patient or authorized representative who has indicated his/her understanding and acceptance.  ? ? ? ?Dental Advisory Given ? ?Plan Discussed with: CRNA ? ?Anesthesia Plan Comments:   ? ? ? ? ? ? ?Anesthesia Quick Evaluation ? ?

## 2021-07-06 NOTE — Progress Notes (Signed)
Cross Cover ?Patient with dementia and falls, in metabolic encephalopathic state causing agitation, confusion and is no redirectable. Given encephalopathy and recent fall, sedative medication likely to worsen state.  Wife told nurse he has had to be restrained in past. Soft wrist restraints ordered ?

## 2021-07-06 NOTE — Progress Notes (Signed)
? ?07/05/2021 ?7:13 AM  ? ?Scott Guzman ?Jan 29, 1941 ?[id number] ? ?Referring provider: [name], MD ?[address] [place],  [place] [address] ? ?Chief Complaint  ?Patient presents with  ? Nephrolithiasis  ? ?Urologic history: ?1.  History gross hematuria ?Episode total gross painless hematuria October 2022 ?On chronic anticoagulation ?Cystoscopy mild/moderate prostate enlargement ?CTU 03/29/2021 bilateral nephrolithiasis and 4 mm left proximal ureteral calculus ? ? ?HPI: ?81 y.o. male presents for follow-up of recent ED visit.  He presents today with his wife ? ?Previously seen November 2022 for gross hematuria ?CTU January 2023 showed a 4 mm proximal ureteral calculus ?He was asymptomatic and desired a trial of passage.  Calculus was not visualized on follow-up KUB.  A 1 month follow-up KUB was recommended and repeat imaging if stone no longer seen however this was apparently not performed ?Presented to [facility] ED 07/03/2021 complaining of cute onset left lower quadrant abdominal pain and mild nausea ?CT showed a 5 mm left distal ureteral calculus with moderate hydronephrosis/upstream hydroureter ?Also a nonobstructing right distal ureteral calculus ?UA with microscopic hematuria and creatinine stable ?Treated with parenteral analgesics and discharged on tamsulosin, tramadol, Naprosyn and ondansetron ?Since ED visit he has had minimal pain.  Denies fever, chills, nausea or vomiting ? ? ?PMH: ?Past Medical History:  ?Diagnosis Date  ? Colon cancer ([place]) 2000  ? He states he had a partial colon resection.   ? Coronary artery disease   ? Dysphagia   ? Dyspnea   ? Dysrhythmia   ? Atrial Fibrillation  ? Hyperlipidemia   ? Hypertension   ? PVC (premature ventricular contraction)   ? Temporal arteritis ([place])   ? Thyroid disease   ? ? ?Surgical History: ?Past Surgical History:  ?Procedure Laterality Date  ? BACK SURGERY    ? Kyphoplasty T12  (Nov. 2018)  ? COLON SURGERY    ? COLONOSCOPY WITH PROPOFOL N/A 05/05/2017  ? Procedure:  COLONOSCOPY WITH PROPOFOL;  Surgeon: Manya Silvas, MD;  Location: [facility] ENDOSCOPY;  Service: Endoscopy;  Laterality: N/A;  ? KYPHOPLASTY N/A 01/23/2017  ? Procedure: [id number];  Surgeon: [name], MD;  Location: [facility] ORS;  Service: Orthopedics;  Laterality: N/A;  ? LEFT HEART CATH AND CORONARY ANGIOGRAPHY Right 12/03/2018  ? Procedure: LEFT HEART CATH AND CORONARY ANGIOGRAPHY;  Surgeon: [name], MD;  Location: [facility] LAB;  Service: Cardiovascular;  Laterality: Right;  ? ? ?Home Medications:  ?Allergies as of 07/05/2021   ?No Known Allergies ?  ? ?  ?Medication List  ?  ? ?  ? Accurate as of July 05, 2021  7:47 PM. If you have any questions, ask your nurse or doctor.  ?  ?  ? ?  ? ?albuterol 108 (90 Base) MCG/ACT inhaler ?Commonly known as: VENTOLIN HFA ?Inhale 2 puffs into the lungs every 6 (six) hours as needed for wheezing or shortness of breath. ?  ?amiodarone 200 MG tablet ?Commonly known as: PACERONE ?Take 100 mg by mouth daily. ?  ?docusate sodium 50 MG capsule ?Commonly known as: COLACE ?Take 1 capsule (50 mg total) by mouth daily as needed for mild constipation. ?  ?methimazole 10 MG tablet ?Commonly known as: TAPAZOLE ?Take 5 mg by mouth every other day. ?  ?mirtazapine 30 MG tablet ?Commonly known as: REMERON ?Take 1 tablet (30 mg total) by mouth at bedtime. Please schedule office visit before any future refills. ?  ?naproxen 500 MG tablet ?Commonly known as: Naprosyn ?Take 1 tablet (500  mg total) by mouth 2 (two) times daily with a meal. ?  ?nitroGLYCERIN 0.4 MG SL tablet ?Commonly known as: Nitrostat ?Place 1 tablet (0.4 mg total) under the tongue every 5 (five) minutes x 3 doses as needed for chest pain. ?  ?ondansetron 4 MG disintegrating tablet ?Commonly known as: ZOFRAN-ODT ?Take 1 tablet (4 mg total) by mouth every 8 (eight) hours as needed for nausea or vomiting. ?  ?pantoprazole 40 MG tablet ?Commonly known as: Protonix ?Take 1 tablet (40 mg total) by mouth daily. ?   ?polyethylene glycol powder 17 GM/SCOOP powder ?Commonly known as: GLYCOLAX/MIRALAX ?Take by mouth. ?  ?predniSONE 20 MG tablet ?Commonly known as: DELTASONE ?Take 20 mg by mouth 2 (two) times daily. ?  ?pyridostigmine 60 MG tablet ?Commonly known as: MESTINON ?TAKE 1/2 TO 1 TABLET BY MOUTH 3 TIMES A DAY. Call [phone] to schedule follow up for future refills ?  ?Rivaroxaban 15 MG Tabs tablet ?Commonly known as: XARELTO ?Take 15 mg by mouth daily with supper. ?  ?tamsulosin 0.4 MG Caps capsule ?Commonly known as: Flomax ?Take 1 capsule (0.4 mg total) by mouth daily. ?  ?traMADol 50 MG tablet ?Commonly known as: Ultram ?Take 1 tablet (50 mg total) by mouth every 6 (six) hours as needed. ?  ? ?  ? ? ?Allergies: No Known Allergies ? ?Family History: ?Family History  ?Problem Relation Age of Onset  ? Colon cancer Brother   ? ? ?Social History:  reports that he has never smoked. He has never used smokeless tobacco. He reports current alcohol use of about 4.0 standard drinks per week. He reports that he does not use drugs. ? ? ?Physical Exam: ?BP 113/71   Pulse 75   Ht '5\' 10"'$  (1.778 m)   Wt 130 lb (59 kg)   BMI 18.65 kg/m?   ?Constitutional:  Alert, No acute distress. ?HEENT: [place] AT, moist mucus membranes.  Trachea midline, no masses. ?Cardiovascular: No clubbing, cyanosis, or edema. ?Respiratory: Normal respiratory effort, no increased work of breathing. ?GI: Abdomen is soft, nontender, nondistended, no abdominal masses ?GU: No CVA tenderness ?Skin: No rashes, bruises or suspicious lesions. ?Neurologic: Grossly intact, no focal deficits, moving all 4 extremities. ?Psychiatric: Normal mood and affect. ? ? ?Pertinent Imaging: ?CT images 07/03/2021 were personally reviewed and interpreted.   ? ? ?CT Renal Stone Study ? ?Narrative ?CLINICAL DATA:  Severe left flank pain and nausea. ? ?EXAM: ?CT ABDOMEN AND PELVIS WITHOUT CONTRAST ? ?TECHNIQUE: ?Multidetector CT imaging of the abdomen and pelvis was  performed ?following the standard protocol without IV contrast. ? ?RADIATION DOSE REDUCTION: This exam was performed according to the ?departmental dose-optimization program which includes automated ?exposure control, adjustment of the mA and/or kV according to ?patient size and/or use of iterative reconstruction technique. ? ?COMPARISON:  CT scan 03/29/2021 ? ?FINDINGS: ?Lower chest: The lung bases are clear. The heart is normal in size. ?No pericardial effusion. Stable aortic calcifications. ? ?Hepatobiliary: No hepatic lesions or intrahepatic biliary ?dilatation. The gallbladder is unremarkable. No common bile duct ?dilatation. ? ?Pancreas: No mass, inflammation or ductal dilatation. ? ?Spleen: Normal size.  Scattered splenic calcified granulomas. ? ?Adrenals/Urinary Tract: The adrenal glands are normal. ? ?Bilateral renal calculi are noted. The left kidney demonstrate AIDS ?moderate to marked hydroureteronephrosis down to an obstructing 5 x ?5 mm calculus located at the mid acetabular level. ? ?There is a chronic non obstructing calculus measuring 4.5 mm in the ?right distal ureter. The bladder is unremarkable. No bladder mass  or ?bladder calculi. ? ?Stomach/Bowel: The stomach, duodenum, small bowel and colon are ?grossly normal without oral contrast. No inflammatory changes, mass ?lesions or obstructive findings. ? ?Vascular/Lymphatic: Stable atherosclerotic calcifications involving ?the aorta and iliac arteries but no aneurysm. No mesenteric or ?retroperitoneal mass or adenopathy. ? ?Reproductive: Mild prostate gland enlargement. ? ?Other: No pelvic mass or adenopathy. No free pelvic fluid ?collections. No inguinal mass or adenopathy. No abdominal wall ?hernia or subcutaneous lesions. ? ?Musculoskeletal: No acute bony findings. Remote vertebral ?augmentation changes at T12. 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 ? ?IMPRESSION: ?1. 5 x 5 mm distal left ureteral calculus causing  moderate to marked ?left-sided hydroureteronephrosis. ?2. 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 ?3. Bilateral renal calculi. ?4. No other acute abdominal/pelvic findings and no mass lesions or ?adenopathy. ?

## 2021-07-06 NOTE — Plan of Care (Signed)
  Problem: Clinical Measurements: Goal: Will remain free from infection Outcome: Progressing   Problem: Coping: Goal: Level of anxiety will decrease Outcome: Progressing   Problem: Pain Managment: Goal: General experience of comfort will improve Outcome: Progressing   Problem: Safety: Goal: Ability to remain free from injury will improve Outcome: Progressing   

## 2021-07-06 NOTE — Consult Note (Signed)
? ?Urology Consult ? ?I have been asked to see the patient by Dr. Damita Dunnings, for evaluation and management of AMS, hydronephrosis. ? ?Chief Complaint: AMS, falls ? ?History of Present Illness: Scott Guzman is a 81 y.o. year old male with dementia and A fib on amiodarone and Xarelto scheduled for bilateral ureteroscopy with laser lithotripsy and stent placement with Dr. Bernardo Heater in 4 days for management of bilateral ureterolithiasis currently admitted with altered mental status after falling several times at home yesterday. ? ?Admission labs notable for UA with >50 RBCs/hpf, 6-10 WBCs/hpf, no bacteria, and no nitrites; urine culture pending.  Creatinine was initially elevated at 2.58, but is down to 2.39 this morning (baseline 1.4).  WBC count 9.4 this morning.  He is afebrile and VSS this morning after starting labetalol for hypertensive urgency upon arrival. ? ?Admission CT AP with contrast revealed mild left hydronephrosis secondary to a 5 mm distal left ureteral stone.  Previously visualized nonobstructing 4.5 mm distal right ureteral stone not seen on this contrast study. ? ?Patient is restrained this morning and not responsive to questions. ? ?Past Medical History:  ?Diagnosis Date  ? Colon cancer ([place]) 2000  ? He states he had a partial colon resection.   ? Coronary artery disease   ? Dysphagia   ? Dyspnea   ? Dysrhythmia   ? Atrial Fibrillation  ? Hyperlipidemia   ? Hypertension   ? PVC (premature ventricular contraction)   ? Temporal arteritis ([place])   ? Thyroid disease   ? ? ?Past Surgical History:  ?Procedure Laterality Date  ? BACK SURGERY    ? Kyphoplasty T12  (Nov. 2018)  ? COLON SURGERY    ? COLONOSCOPY WITH PROPOFOL N/A 05/05/2017  ? Procedure: COLONOSCOPY WITH PROPOFOL;  Surgeon: Manya Silvas, MD;  Location: [facility] ENDOSCOPY;  Service: Endoscopy;  Laterality: N/A;  ? KYPHOPLASTY N/A 01/23/2017  ? Procedure: [id number];  Surgeon: [name], MD;  Location: [facility] ORS;  Service: Orthopedics;   Laterality: N/A;  ? LEFT HEART CATH AND CORONARY ANGIOGRAPHY Right 12/03/2018  ? Procedure: LEFT HEART CATH AND CORONARY ANGIOGRAPHY;  Surgeon: [name], MD;  Location: [facility];  Service: Cardiovascular;  Laterality: Right;  ? ? ?Home Medications:  ?Current Meds  ?Medication Sig  ? acetaminophen (TYLENOL) 500 MG tablet Take 1,000 mg by mouth every 8 (eight) hours as needed for moderate pain.  ? albuterol (VENTOLIN HFA) 108 (90 Base) MCG/ACT inhaler Inhale 2 puffs into the lungs every 6 (six) hours as needed for wheezing or shortness of breath.  ? docusate sodium (COLACE) 50 MG capsule Take 1 capsule (50 mg total) by mouth daily as needed for mild constipation.  ? mirtazapine (REMERON) 30 MG tablet Take 1 tablet (30 mg total) by mouth at bedtime. Please schedule office visit before any future refills.  ? naproxen (NAPROSYN) 500 MG tablet Take 1 tablet (500 mg total) by mouth 2 (two) times daily with a meal.  ? nitroGLYCERIN (NITROSTAT) 0.4 MG SL tablet Place 1 tablet (0.4 mg total) under the tongue every 5 (five) minutes x 3 doses as needed for chest pain.  ? ondansetron (ZOFRAN-ODT) 4 MG disintegrating tablet Take 1 tablet (4 mg total) by mouth every 8 (eight) hours as needed for nausea or vomiting.  ? polyethylene glycol powder (GLYCOLAX/MIRALAX) 17 GM/SCOOP powder Take by mouth.  ? pyridostigmine (MESTINON) 60 MG tablet TAKE 1/2 TO 1 TABLET BY MOUTH 3 TIMES A DAY. Call [phone] to schedule follow up  for future refills  ? Rivaroxaban (XARELTO) 15 MG TABS tablet Take 15 mg by mouth daily with supper.  ? tamsulosin (FLOMAX) 0.4 MG CAPS capsule Take 1 capsule (0.4 mg total) by mouth daily.  ? traMADol (ULTRAM) 50 MG tablet Take 1 tablet (50 mg total) by mouth every 6 (six) hours as needed.  ? ? ?Allergies: No Known Allergies ? ?Family History  ?Problem Relation Age of Onset  ? Colon cancer Brother   ? ? ?Social History:  reports that he has never smoked. He has never used smokeless tobacco. He  reports current alcohol use of about 4.0 standard drinks per week. He reports that he does not use drugs. ? ?ROS: ?A complete review of systems was performed.  All systems are negative except for pertinent findings as noted. ? ?Physical Exam:  ?Vital signs in last 24 hours: ?Temp:  [97.5 ?F (36.4 ?C)-98.5 ?F (36.9 ?C)] 97.5 ?F (36.4 ?C) (04/14 0734) ?Pulse Rate:  [64-90] 66 (04/14 0734) ?Resp:  [8-19] 14 (04/14 0734) ?BP: (113-196)/(71-121) 155/73 (04/14 0734) ?SpO2:  [92 %-100 %] 100 % (04/14 0734) ?Weight:  [59 kg-61.2 kg] 61.2 kg (04/14 0046) ?Constitutional: Awake, no acute distress, wrist restraints in place ?HEENT: [place] AT, moist mucus membranes ?Cardiovascular: No clubbing, cyanosis, or edema ?Respiratory: Normal respiratory effort ?Skin: No rashes, bruises or suspicious lesions ?Neurologic: Grossly intact, no focal deficits, moving all 4 extremities ?Psychiatric: Not responding to questions ? ?Laboratory Data:  ?Recent Labs  ?  07/03/21 ?1287 07/05/21 ?1947 07/06/21 ?8676  ?WBC 7.7 8.6 9.4  ?HGB 13.9 11.7* 13.3  ?HCT 43.1 36.7* 41.3  ? ?Recent Labs  ?  07/03/21 ?1734 07/05/21 ?1947 07/06/21 ?7209  ?NA 137 136 136  ?K 5.1 5.1 4.3  ?CL 100 102 101  ?CO2 '29 26 27  '$ ?GLUCOSE 120* 104* 142*  ?BUN 21 33* 28*  ?CREATININE 1.43* 2.58* 2.39*  ?CALCIUM 9.4 8.7* 8.7*  ? ?Recent Labs  ?  07/05/21 ?1947  ?INR 1.2  ? ?Urinalysis ?   ?Component Value Date/Time  ? COLORURINE YELLOW (A) 07/05/2021 2137  ? APPEARANCEUR HAZY (A) 07/05/2021 2137  ? APPEARANCEUR Clear 07/05/2021 1036  ? LABSPEC 1.027 07/05/2021 2137  ? LABSPEC 1.028 10/20/2013 0150  ? PHURINE 5.0 07/05/2021 2137  ? GLUCOSEU NEGATIVE 07/05/2021 2137  ? GLUCOSEU Negative 10/20/2013 0150  ? HGBUR LARGE (A) 07/05/2021 2137  ? [place] NEGATIVE 07/05/2021 2137  ? BILIRUBINUR Negative 07/05/2021 1036  ? BILIRUBINUR Negative 10/20/2013 0150  ? KETONESUR 5 (A) 07/05/2021 2137  ? [place] NEGATIVE 07/05/2021 2137  ? NITRITE NEGATIVE 07/05/2021 2137  ? LEUKOCYTESUR SMALL  (A) 07/05/2021 2137  ? LEUKOCYTESUR Negative 10/20/2013 0150  ? ?Results for orders placed or performed during the hospital encounter of 07/05/21  ?Resp Panel by RT-PCR (Flu A&B, Covid) Nasopharyngeal Swab     Status: None  ? Collection Time: 07/05/21  8:42 PM  ? Specimen: Nasopharyngeal Swab; Nasopharyngeal(NP) swabs in vial transport medium  ?Result Value Ref Range Status  ? SARS Coronavirus 2 by RT PCR NEGATIVE NEGATIVE Final  ?  Comment: (NOTE) ?SARS-CoV-2 target nucleic acids are NOT DETECTED. ? ?The SARS-CoV-2 RNA is generally detectable in upper respiratory ?specimens during the acute phase of infection. The lowest ?concentration of SARS-CoV-2 viral copies this assay can detect is ?138 copies/mL. A negative result does not preclude SARS-Cov-2 ?infection and should not be used as the sole basis for treatment or ?other patient management decisions. A negative result may occur with  ?improper specimen  collection/handling, submission of specimen other ?than nasopharyngeal swab, presence of viral mutation(s) within the ?areas targeted by this assay, and inadequate number of viral ?copies(<138 copies/mL). A negative result must be combined with ?clinical observations, patient history, and epidemiological ?information. The expected result is Negative. ? ?Fact Sheet for Patients:  ?EntrepreneurPulse.com.au ? ?Fact Sheet for Healthcare Providers:  ?IncredibleEmployment.be ? ?This test is no t yet approved or cleared by the [place] FDA and  ?has been authorized for detection and/or diagnosis of SARS-CoV-2 by ?FDA under an Emergency Use Authorization (EUA). This EUA will remain  ?in effect (meaning this test can be used) for the duration of the ?COVID-19 declaration under Section 564(b)(1) of the Act, 21 ?U.S.C.section 360bbb-3(b)(1), unless the authorization is terminated  ?or revoked sooner.  ? ? ?  ? Influenza A by PCR NEGATIVE NEGATIVE Final  ? Influenza B by PCR NEGATIVE  NEGATIVE Final  ?  Comment: (NOTE) ?The Xpert Xpress SARS-CoV-2/FLU/RSV plus assay is intended as an aid ?in the diagnosis of influenza from Nasopharyngeal swab specimens and ?should not be used as a sole basi

## 2021-07-06 NOTE — Progress Notes (Addendum)
[place] at [facility] ? ? ?PATIENT NAME: Scott Guzman   ? ?MR#:  [id number] ? ?DATE OF BIRTH:  [birth year] ? ?SUBJECTIVE:  ? ?patient is confused at baseline. He just got back from his urology code procedure. No family at bedside. Mittens placed in both hands for safety. Trying to pull Foley and IV site. ?Has baseline dementia. ? ? ?VITALS:  ?Blood pressure (!) 171/77, pulse 84, temperature 98.2 ?F (36.8 ?C), resp. rate 14, height '5\' 9"'$  (1.753 m), weight 61.2 kg, SpO2 100 %. ? ?PHYSICAL EXAMINATION:  ? ?GENERAL:  81 y.o.-year-old patient lying in the bed with no acute distress.  ?LUNGS: Normal breath sounds bilaterally, no wheezing, rales, rhonchi.  ?CARDIOVASCULAR: S1, S2 normal. No murmurs, rubs, or gallops.  ?ABDOMEN: Soft, nontender, nondistended. Bowel sounds present. FOLEY+ ?EXTREMITIES: No  edema b/l.   [place]+ ?NEUROLOGIC: nonfocal  patient is confused at baseline ?SKIN: No obvious rash, lesion, or ulcer.  ? ?LABORATORY PANEL:  ?CBC ?Recent Labs  ?Lab 07/06/21 ?4098  ?WBC 9.4  ?HGB 13.3  ?HCT 41.3  ?PLT 189  ? ? ?Chemistries  ?Recent Labs  ?Lab 07/05/21 ?1947 07/06/21 ?1191  ?NA 136 136  ?K 5.1 4.3  ?CL 102 101  ?CO2 26 27  ?GLUCOSE 104* 142*  ?BUN 33* 28*  ?CREATININE 2.58* 2.39*  ?CALCIUM 8.7* 8.7*  ?AST 26  --   ?ALT 12  --   ?ALKPHOS 63  --   ?BILITOT 1.0  --   ? ?Cardiac Enzymes ?No results for input(s): TROPONINI in the last 168 hours. ?RADIOLOGY:  ?CT HEAD WO CONTRAST (5MM) ? ?Result Date: 07/05/2021 ?CLINICAL DATA:  Status post multiple falls. EXAM: CT HEAD WITHOUT CONTRAST TECHNIQUE: Contiguous axial images were obtained from the base of the skull through the vertex without intravenous contrast. RADIATION DOSE REDUCTION: This exam was performed according to the departmental dose-optimization program which includes automated exposure control, adjustment of the mA and/or kV according to patient size and/or use of iterative reconstruction technique. COMPARISON:  December 28, 2020  FINDINGS: Brain: There is mild cerebral atrophy with widening of the extra-axial spaces and ventricular dilatation. There are areas of decreased attenuation within the white matter tracts of the supratentorial brain, consistent with microvascular disease changes. Vascular: No hyperdense vessel or unexpected calcification. Skull: Normal. Negative for fracture or focal lesion. Sinuses/Orbits: There is mild right ethmoid sinus mucosal thickening. A 12 mm x 13 mm right maxillary sinus polyp versus mucous retention cyst is also seen. Other: None. IMPRESSION: 1. No acute intracranial abnormality. 2. Mild right ethmoid sinus disease. 3. Right maxillary sinus polyp versus mucous retention cyst. Electronically Signed   By: Virgina Norfolk M.D.   On: 07/05/2021 20:53  ? ?CT Cervical Spine Wo Contrast ? ?Result Date: 07/05/2021 ?CLINICAL DATA:  Multiple falls. EXAM: CT CERVICAL SPINE WITHOUT CONTRAST TECHNIQUE: Multidetector CT imaging of the cervical spine was performed without intravenous contrast. Multiplanar CT image reconstructions were also generated. RADIATION DOSE REDUCTION: This exam was performed according to the departmental dose-optimization program which includes automated exposure control, adjustment of the mA and/or kV according to patient size and/or use of iterative reconstruction technique. COMPARISON:  None. FINDINGS: Alignment: There is approximately 1 mm stepwise retrolisthesis of C3 on C4 and C4 on C5. Skull base and vertebrae: No acute fracture. No primary bone lesion or focal pathologic process. Soft tissues and spinal canal: No prevertebral fluid or swelling. No visible canal hematoma. Disc levels: Moderate severity endplate sclerosis is seen  at the levels of C3-C4, C4-C5 and C5-C6. There is moderate to marked severity narrowing of the anterior atlantoaxial articulation. Moderate to marked severity, predominant posterior intervertebral disc space narrowing is seen at C2-C3, C3-C4, C4-C5, C5-C6 and  C6-C7. Bilateral moderate to marked severity multilevel facet joint hypertrophy is noted. Upper chest: Negative. Other: None. IMPRESSION: 1. Moderate to marked severity multilevel degenerative changes without evidence of an acute fracture or subluxation. 2. Approximately 1 mm stepwise retrolisthesis of C3 on C4 and C4 on C5. Electronically Signed   By: Virgina Norfolk M.D.   On: 07/05/2021 20:57  ? ?MR BRAIN WO CONTRAST ? ?Result Date: 07/05/2021 ?CLINICAL DATA:  Initial evaluation for neuro deficit, stroke suspected. EXAM: MRI HEAD WITHOUT CONTRAST TECHNIQUE: Multiplanar, multiecho pulse sequences of the brain and surrounding structures were obtained without intravenous contrast. COMPARISON:  Prior CT from earlier the same day. FINDINGS: Brain: Generalized age-related cerebral atrophy with moderate chronic microvascular ischemic disease. No evidence for acute or subacute infarct. Gray-white matter differentiation maintained. No encephalomalacia to suggest chronic cortical infarction. No acute intracranial hemorrhage. Few punctate chronic micro hemorrhages noted, likely small vessel related. No mass lesion or midline shift. No hydrocephalus or extra-axial fluid collection. Pituitary gland suprasellar region normal. Midline structures intact and normal. Vascular: Major intracranial vascular flow voids are well maintained. Skull and upper cervical spine: Craniocervical junction within normal limits. Bone marrow signal intensity normal. No scalp soft tissue abnormality. Sinuses/Orbits: Prior bilateral ocular lens replacement. Scattered mucosal thickening noted within the ethmoidal air cells and maxillary sinuses with superimposed small right maxillary sinus retention cyst. Trace left mastoid effusion noted. Other: None. IMPRESSION: 1. No acute intracranial abnormality. 2. Generalized age-related cerebral atrophy with moderate chronic microvascular ischemic disease. Electronically Signed   By: Jeannine Boga M.D.    On: 07/05/2021 23:50  ? ?CT CHEST ABDOMEN PELVIS W CONTRAST ? ?Result Date: 07/05/2021 ?CLINICAL DATA:  Initial evaluation for acute trauma, multiple falls. EXAM: CT CHEST, ABDOMEN, AND PELVIS WITH CONTRAST TECHNIQUE: Multidetector CT imaging of the chest, abdomen and pelvis was performed following the standard protocol during bolus administration of intravenous contrast. RADIATION DOSE REDUCTION: This exam was performed according to the departmental dose-optimization program which includes automated exposure control, adjustment of the mA and/or kV according to patient size and/or use of iterative reconstruction technique. CONTRAST:  164m OMNIPAQUE IOHEXOL 300 MG/ML  SOLN COMPARISON:  Prior study from 07/03/2021 FINDINGS: CT CHEST FINDINGS Cardiovascular: Intrathoracic aorta of normal caliber without aneurysm or acute traumatic injury. Moderate aortic atherosclerosis. Visualized great vessels intact. Heart size normal. No pericardial effusion. Scattered coronary artery and valvular calcifications noted. Limited assessment of the pulmonary arterial tree grossly unremarkable. Mediastinum/Nodes: Thyroid normal. No enlarged mediastinal, hilar, or axillary lymph nodes. Few subcentimeter calcified subcarinal and right hilar nodes noted, consistent with prior granulomatous infection. No mediastinal mass or hematoma. Esophagus within normal limits. Lungs/Pleura: Tracheobronchial tree intact and patent. Lungs well inflated. No focal infiltrates or pulmonary contusion. No edema or pleural effusion. No pneumothorax. Few subcentimeter calcified granulomas noted within the right lower lobe. Musculoskeletal: External soft tissues demonstrate no acute finding. No acute rib fracture or other osseous abnormality. No discrete or worrisome osseous lesions. CT ABDOMEN PELVIS FINDINGS Hepatobiliary: Subcentimeter hypodensity noted within the right parotid lobe, too small to characterize. Liver otherwise intact without abnormality.  Gallbladder within normal limits. Trace intrahepatic biliary dilatation noted centrally, of uncertain significance. Common bile duct of relatively normal caliber. Pancreas: Pancreas within normal limits.

## 2021-07-06 NOTE — Progress Notes (Signed)
This RN had a discussion with the patient's wife, Bilbo Carcamo, prior to her going home to take her pertinent nighttime medications. Per Mrs. Yurkovich, this is the patient's baseline and he is a Barista" and "always on the go". She is also unable to redirect him, and he did become upset and threw the wash basin he had been given in case he felt the need to vomit. She states that he is not frequently hospitalized, but in the past he has been restrained for his safety and that she understands and agrees that if it is necessary to prevent further injury, she is agreeable to the patient being restrained if needed. ?

## 2021-07-06 NOTE — Transfer of Care (Signed)
Immediate Anesthesia Transfer of Care Note ? ?Patient: Scott Guzman ? ?Procedure(s) Performed: CYSTOSCOPY/URETEROSCOPY/HOLMIUM LASER/STENT PLACEMENT (Bilateral) ? ?Patient Location: PACU ? ?Anesthesia Type:General ? ?Level of Consciousness: drowsy ? ?Airway & Oxygen Therapy: Patient Spontanous Breathing and Patient connected to face mask oxygen ? ?Post-op Assessment: Report given to RN and Post -op Vital signs reviewed and stable ? ?Post vital signs: Reviewed and stable ? ?Last Vitals:  ?Vitals Value Taken Time  ?BP 140/68 07/06/21 1353  ?Temp    ?Pulse 62 07/06/21 1356  ?Resp 11 07/06/21 1356  ?SpO2 100 % 07/06/21 1356  ?Vitals shown include unvalidated device data. ? ?Last Pain:  ?Vitals:  ? 07/06/21 1223  ?TempSrc: Oral  ?PainSc:   ?   ? ?  ? ?Complications: No notable events documented. ?

## 2021-07-06 NOTE — Progress Notes (Signed)
Restraint order discontinued and removed from patient. Patient medicated, wife at bedside, and safety mitts placed on. Orma Flaming, RN ? ?

## 2021-07-07 DIAGNOSIS — G9341 Metabolic encephalopathy: Secondary | ICD-10-CM | POA: Diagnosis not present

## 2021-07-07 LAB — URINE CULTURE: Culture: <10000 — AB

## 2021-07-07 MED ORDER — QUETIAPINE FUMARATE 25 MG PO TABS
50.0000 mg | ORAL_TABLET | Freq: Every day | ORAL | Status: DC
  Administered 2021-07-07: 50 mg via ORAL
  Filled 2021-07-07: qty 2

## 2021-07-07 NOTE — Evaluation (Addendum)
Physical Therapy Evaluation ?Patient Details ?Name: Scott Guzman ?MRN: [id number] ?DOB: Jul 09, 1940 ?Today's Date: 07/07/2021 ? ?History of Present Illness ? Pt is a 81 y/o M who was seen in the ED on 8/46/65 for renal colic, was treated & discharged. Pt returned to the ED on 07/05/21 for evaluation of confusion & 2 falls. CT revealed 5 mm obstructive ureteral calculus on the left with hydroureteronephrosis. Pt is s/p cystoscopy & left ureteral stent placement on 07/06/21. PMH: CAD, a-fib on amiodarone & xarelto, dementia, HTN, colon CA, dysphagia, HLD, HTN, PVC, thyroid disease  ?Clinical Impression ? Pt seen for PT evaluation with wife [name]) present & providing PLOF & home set up information. Prior to admission pt was ambulatory without AD but with shuffled gait, wife is unsure of falls or not, but pt with impaired cognition as he would intermittently be unable to recall his name, city he's in (wife also reports pt has had auditory hallucinations as he would hear people living in his home). On this date, pt is sleeping soundly but arouses to name. Pt requires total assist for supine<>sit and max fade to min assist for static sitting EOB 2/2 R lateral lean. Pt transfers STS from EOB multiple times with min assist HHA but would quickly return to sitting at times. Pt agreeable to attempting ambulation but with impaired motor planning; pt attempted to take steps to R along EOB but required max assist for balance. Pt's wife is open to pt going to STR to maximize independence with functional mobility, decrease caregiver burden, & reduce fall risk prior to return home. ?   ?   ? ?Recommendations for follow up therapy are one component of a multi-disciplinary discharge planning process, led by the attending physician.  Recommendations may be updated based on patient status, additional functional criteria and insurance authorization. ? ?Follow Up Recommendations Skilled nursing-short term rehab (<3 hours/day) ? ?   ?Assistance Recommended at Discharge Frequent or constant Supervision/Assistance  ?Patient can return home with the following ? Two people to help with walking and/or transfers;Assistance with feeding;Two people to help with bathing/dressing/bathroom;Assistance with cooking/housework;Direct supervision/assist for financial management;Assist for transportation;Direct supervision/assist for medications management;Help with stairs or ramp for entrance ? ?  ?Equipment Recommendations None recommended by PT (TBD in next venue)  ?Recommendations for Other Services ?   OT consult ?  ?Functional Status Assessment Patient has had a recent decline in their functional status and demonstrates the ability to make significant improvements in function in a reasonable and predictable amount of time.  ? ?  ?Precautions / Restrictions Precautions ?Precautions: Fall ?Restrictions ?Weight Bearing Restrictions: No  ? ?  ? ?Mobility ? Bed Mobility ?Overal bed mobility: Needs Assistance ?Bed Mobility: Supine to Sit, Sit to Supine ?  ?  ?Supine to sit: Total assist, HOB elevated ?Sit to supine: Total assist ?  ?  ?  ? ?Transfers ?Overall transfer level: Needs assistance ?Equipment used: 1 person hand held assist ?Transfers: Sit to/from Stand ?Sit to Stand: Min assist ?  ?  ?  ?  ?  ?General transfer comment: Pt able to transfer STS multiple times from elevated EOB but would quickly sit back down ?  ? ?Ambulation/Gait ?  ?  ?  ?  ?  ?  ?  ?  ? ?Stairs ?  ?  ?  ?  ?  ? ?Wheelchair Mobility ?  ? ?Modified Rankin (Stroke Patients Only) ?  ? ?  ? ?Balance Overall balance assessment: Needs assistance ?  Sitting-balance support: Feet supported, Bilateral upper extremity supported ?Sitting balance-Leahy Scale: Zero ?Sitting balance - Comments: Zero 2/2 R lateral lean progressing to poor as pt able to maintain static sitting with min assist. ?  ?Standing balance support: Single extremity supported, During functional activity ?Standing balance-Leahy  Scale: Zero ?  ?  ?  ?  ?  ?  ?  ?  ?  ?  ?  ?  ?   ? ? ? ?Pertinent Vitals/Pain Pain Assessment ?Pain Assessment: Faces ?Faces Pain Scale: Hurts a little bit ?Pain Location: generalized ?Pain Descriptors / Indicators: Grimacing, Discomfort ?Pain Intervention(s): Monitored during session, Repositioned  ? ? ?Home Living Family/patient expects to be discharged to:: Private residence ?Living Arrangements: Spouse/significant other ?Available Help at Discharge: Family ?Type of Home: House ?Home Access: Stairs to enter ?Entrance Stairs-Rails: Right ?Entrance Stairs-Number of Steps: 2 ?  ?Home Layout: Two level;Able to live on main level with bedroom/bathroom ?  ?   ?  ?Prior Function   ?  ?  ?  ?  ?  ?  ?Mobility Comments: Pt was ambulatory without AD, would shuffle feet. Wife suspects pt has fallen but isn't sure of it. Wife hides the car keys but at times pt can find them. ?ADLs Comments: Pt showered without assistance but wife unsure of his abilities to do so as she would not be in the room with him. Pt would need occasional help for buttoning clothing. ?  ? ? ?Hand Dominance  ?   ? ?  ?Extremity/Trunk Assessment  ? Upper Extremity Assessment ?Upper Extremity Assessment: Generalized weakness;Difficult to assess due to impaired cognition ?  ? ?Lower Extremity Assessment ?Lower Extremity Assessment: Generalized weakness;Difficult to assess due to impaired cognition ?  ? ?Cervical / Trunk Assessment ?Cervical / Trunk Assessment:  (rounded shoulders)  ?Communication  ?    ?Cognition Arousal/Alertness: Awake/alert ?Behavior During Therapy: [facility] for tasks assessed/performed ?Overall Cognitive Status: History of cognitive impairments - at baseline ?Area of Impairment: Attention, Memory, Safety/judgement, Awareness, Following commands, Problem solving ?  ?  ?  ?  ?  ?  ?  ?  ?Orientation Level: Disoriented to, Place, Time, Situation ?Current Attention Level: Focused ?Memory: Decreased short-term memory ?Following Commands:  Follows one step commands inconsistently, Follows one step commands with increased time ?Safety/Judgement: Decreased awareness of safety, Decreased awareness of deficits ?Awareness: Intellectual ?Problem Solving: Slow processing, Decreased initiation, Requires verbal cues, Difficulty sequencing, Requires tactile cues ?General Comments: Wife reports pt would intermittently recall his name, city, but was experiencing sundowning. Wife also reports pt has had auditory hallucinations as he believes he has heard people living in his house. On this date, pt intermittently responds to name. ?  ?  ? ?  ?General Comments General comments (skin integrity, edema, etc.): Pt with bloody urine noted in catheter bag & dried blood around foley catheter insertion sight - nurse made aware but already aware. ? ?  ?Exercises    ? ?Assessment/Plan  ?  ?PT Assessment Patient needs continued PT services  ?PT Problem List Decreased strength;Decreased mobility;Decreased safety awareness;Decreased coordination;Decreased activity tolerance;Decreased cognition;Cardiopulmonary status limiting activity;Decreased balance;Decreased knowledge of use of DME ? ?   ?  ?PT Treatment Interventions Therapeutic activities;DME instruction;Cognitive remediation;Modalities;Gait training;Patient/family education;Therapeutic exercise;Stair training;Balance training;Functional mobility training;Neuromuscular re-education;Manual techniques   ? ?PT Goals (Current goals can be found in the Care Plan section)  ?Acute Rehab PT Goals ?Patient Stated Goal: get better ?PT Goal Formulation: With family ?Time For Goal Achievement: 07/21/21 ?Potential to  Achieve Goals: Fair ? ?  ?Frequency Min 2X/week ?  ? ? ?Co-evaluation   ?  ?  ?  ?  ? ? ?  ?AM-PAC PT "6 Clicks" Mobility  ?Outcome Measure Help needed turning from your back to your side while in a flat bed without using bedrails?: A Lot ?Help needed moving from lying on your back to sitting on the side of a flat bed  without using bedrails?: Total ?Help needed moving to and from a bed to a chair (including a wheelchair)?: Total ?Help needed standing up from a chair using your arms (e.g., wheelchair or bedside chair)?: Total ?Help n

## 2021-07-07 NOTE — [place] FL2 (Signed)
?[place] MEDICAID FL2 LEVEL OF CARE SCREENING TOOL  ?  ? ?IDENTIFICATION  ?Patient Name: ?Scott Guzman Birthdate: 1940-08-29 Sex: male Admission Date (Current Location): ?07/05/2021  ?[place] and [place] Number: ? [place] ?  Facility and Address:  ?[facility], [address], [place], [place] [address] ?     Provider Number: ?[id number]  ?Attending Physician Name and Address:  ?Enzo Bi, MD ? Relative Name and Phone Number:  ?Flynt Breeze [phone] ?   ?Current Level of Care: ?SNF Recommended Level of Care: ?[place] Prior Approval Number: ?  ? ?Date Approved/Denied: ?  PASRR Number: ?[id number] A ? ?Discharge Plan: ?SNF ?  ? ?Current Diagnoses: ?Patient Active Problem List  ? Diagnosis Date Noted  ? Acute metabolic encephalopathy 69/67/8938  ? Hypertensive urgency 07/05/2021  ? Falls, initial encounter 07/05/2021  ? Dementia ([place]) 07/05/2021  ? Chronic anticoagulation 07/05/2021  ? AKI (acute kidney injury) ([place]) 07/05/2021  ? Nephrolithiasis 07/05/2021  ? Hydronephrosis with urinary obstruction due to ureteral calculus, left 07/05/2021  ? Anemia 03/29/2021  ? Other fatigue 03/29/2021  ? Hyperthyroidism 10/27/2020  ? Constipation 10/27/2020  ? Memory loss 08/01/2020  ? Orthostatic hypotension 08/01/2020  ? Vertigo 08/01/2020  ? Excessive daytime sleepiness 08/01/2020  ? Other thrombophilia ([place]) 03/04/2020  ? Aortic atherosclerosis ([place]) 03/04/2020  ? CKD (chronic kidney disease) stage 3, GFR 30-59 ml/min ([name]) 03/03/2020  ? Coronary artery disease 12/04/2018  ? Abnormality of gait 07/16/2017  ? Essential hypertension 07/16/2017  ? Obstructive hydrocephalus ([place]) 07/16/2017  ? Syncope and collapse 07/16/2017  ? Headache 07/16/2017  ? Cerebral ventriculomegaly 06/11/2017  ? Atrial fibrillation with controlled ventricular response ([place]) 06/03/2017  ? History of colon cancer 02/07/2017  ? History of compression fracture of spine 01/13/2017  ? DOE (dyspnea on exertion)  08/19/2016  ? Temporal arteritis ([place]) 03/12/2016  ? BPH (benign prostatic hyperplasia) 03/12/2016  ? Elevated erythrocyte sedimentation rate 01/29/2016  ? Insomnia 10/31/2014  ? Hypercholesterolemia 10/13/2014  ? ? ?Orientation RESPIRATION BLADDER Height & Weight   ?  ?Self ? Normal Indwelling catheter Weight: 61.2 kg ?Height:  '5\' 9"'$  (175.3 cm)  ?BEHAVIORAL SYMPTOMS/MOOD NEUROLOGICAL BOWEL NUTRITION STATUS  ?    Incontinent Diet  ?AMBULATORY STATUS COMMUNICATION OF NEEDS Skin   ?Limited Assist Verbally (However limited comphrension) Other (Comment) (foam dressing to hygienic sites) ?  ?  ?  ?    ?     ?     ? ? ?Personal Care Assistance Level of Assistance  ?Bathing, Feeding, Dressing Bathing Assistance: Limited assistance ?Feeding assistance: Limited assistance ?Dressing Assistance: Limited assistance ?   ? ?Functional Limitations Info  ?    ?  ?   ? ? ?SPECIAL CARE FACTORS FREQUENCY  ?    ?  ?  ?  ?  ?  ?  ?   ? ? ?Contractures Contractures Info: Not present  ? ? ?Additional Factors Info  ?Code Status   ?  ?  ?  ?  ?   ? ?Current Medications (07/07/2021):  This is the current hospital active medication list ?Current Facility-Administered Medications  ?Medication Dose Route Frequency Provider Last Rate Last Admin  ? 0.9 %  sodium chloride infusion   Intravenous Continuous Fritzi Mandes, MD 75 mL/hr at 07/07/21 0925 New Bag at 07/07/21 0925  ? acetaminophen (TYLENOL) tablet 650 mg  650 mg Oral Q6H PRN Athena Masse, MD      ? Or  ? acetaminophen (TYLENOL) suppository 650  mg  650 mg Rectal Q6H PRN Athena Masse, MD      ? Chlorhexidine Gluconate Cloth 2 % PADS 6 each  6 each Topical Daily Kathryne Eriksson, NP      ? docusate (COLACE) 50 MG/5ML liquid 50 mg  50 mg Oral Daily PRN Fritzi Mandes, MD      ? haloperidol lactate (HALDOL) injection 2 mg  2 mg Intravenous Q6H PRN Fritzi Mandes, MD   2 mg at 07/07/21 1648  ? hydrALAZINE (APRESOLINE) injection 10 mg  10 mg Intravenous Q6H PRN Fritzi Mandes, MD   10 mg at 07/06/21  1538  ? LORazepam (ATIVAN) injection 0.5 mg  0.5 mg Intravenous Q12H PRN Fritzi Mandes, MD   0.5 mg at 07/06/21 2008  ? mirtazapine (REMERON) tablet 30 mg  30 mg Oral QHS Athena Masse, MD   30 mg at 07/06/21 2008  ? nitroGLYCERIN (NITROSTAT) SL tablet 0.4 mg  0.4 mg Sublingual Q5 Min x 3 PRN Athena Masse, MD      ? ondansetron [facility]) tablet 4 mg  4 mg Oral Q6H PRN Athena Masse, MD      ? Or  ? ondansetron [facility]) injection 4 mg  4 mg Intravenous Q6H PRN Athena Masse, MD      ? pantoprazole (PROTONIX) EC tablet 40 mg  40 mg Oral Daily Fritzi Mandes, MD   40 mg at 07/06/21 1556  ? pyridostigmine (MESTINON) tablet 60 mg  60 mg Oral TID Athena Masse, MD   60 mg at 07/07/21 1646  ? QUEtiapine (SEROQUEL) tablet 50 mg  50 mg Oral QHS Enzo Bi, MD      ? Rivaroxaban [facility]) tablet 15 mg  15 mg Oral Q supper Fritzi Mandes, MD   15 mg at 07/07/21 1646  ? tamsulosin (FLOMAX) capsule 0.4 mg  0.4 mg Oral Daily Athena Masse, MD   0.4 mg at 07/06/21 1928  ? traMADol (ULTRAM) tablet 50 mg  50 mg Oral Q6H PRN Fritzi Mandes, MD      ? ? ? ?Discharge Medications: ?Please see discharge summary for a list of discharge medications. ? ?Relevant Imaging Results: ? ?Relevant Lab Results: ? ? ?Additional Information ?  ? ?Harriet Masson, RN ? ? ? ? ?

## 2021-07-07 NOTE — Evaluation (Signed)
Occupational Therapy Evaluation ?Patient Details ?Name: Scott Guzman ?MRN: [id number] ?DOB: Oct 26, 1940 ?Today's Date: 07/07/2021 ? ? ?History of Present Illness Pt is a 81 y/o M who was seen in the ED on 0/27/25 for renal colic, was treated & discharged. Pt returned to the ED on 07/05/21 for evaluation of confusion & 2 falls. CT revealed 5 mm obstructive ureteral calculus on the left with hydroureteronephrosis. Pt is s/p cystoscopy & left ureteral stent placement on 07/06/21. PMH: CAD, a-fib on amiodarone & xarelto, dementia, HTN, colon CA, dysphagia, HLD, HTN, PVC, thyroid disease  ? ?Clinical Impression ?  ?Chart reviewed to date, pt greeted in bed agreeable to OT evaluation. Wife present and provides PLOF and home set up information. Pt is oriented to self only. PTA pt was amb household distances with no AD however had history of falls. Pt performed ADL with MOD I with intermittent assist for buttoning, wife assisted with IADLs. Pt presents with poor one step directive following. Pt resistive to mobility until he states "I have to pee" and performed supine>sit with MOD A, STS performed with MIN A however then pt attempting to pull at [place] as R mitt had been doffed prior to session per RN. Pt returned to supine with MIN A, R mitt donned per RN request. All ADLs completed and anticipated to be at MAX A at this time. Pt presents with deficits in strength, activity tolerance, cognition (wife reports this is not typical cognition despite baseline cognitive impairments) all affecting safe and optimal ADL completion. Education provided to wife re: STR vs [place]. Recommend STR at this time. OT will continue to follow acutely.  ?   ? ?Recommendations for follow up therapy are one component of a multi-disciplinary discharge planning process, led by the attending physician.  Recommendations may be updated based on patient status, additional functional criteria and insurance authorization.  ? ?Follow Up Recommendations ? Skilled  nursing-short term rehab (<3 hours/day)  ?  ?Assistance Recommended at Discharge Frequent or constant Supervision/Assistance  ?Patient can return home with the following A lot of help with walking and/or transfers;A lot of help with bathing/dressing/bathroom;Assistance with cooking/housework;Direct supervision/assist for financial management;Assist for transportation;Assistance with feeding;Direct supervision/assist for medications management;Help with stairs or ramp for entrance ? ?  ?Functional Status Assessment ? Patient has had a recent decline in their functional status and demonstrates the ability to make significant improvements in function in a reasonable and predictable amount of time.  ?Equipment Recommendations ? Other (comment) (per next venue of care)  ?  ?Recommendations for Other Services   ? ? ?  ?Precautions / Restrictions Precautions ?Precautions: Fall ?Restrictions ?Weight Bearing Restrictions: No  ? ?  ? ?Mobility Bed Mobility ?Overal bed mobility: Needs Assistance ?Bed Mobility: Supine to Sit, Sit to Supine ?  ?  ?Supine to sit: HOB elevated, Mod assist ?Sit to supine: Mod assist, HOB elevated ?  ?  ?  ? ?Transfers ?Overall transfer level: Needs assistance ?Equipment used: 1 person hand held assist ?Transfers: Sit to/from Stand ?Sit to Stand: Min assist ?  ?  ?  ?  ?  ?  ?  ? ?  ?Balance Overall balance assessment: Needs assistance ?Sitting-balance support: Bilateral upper extremity supported, Feet supported ?Sitting balance-Leahy Scale: Zero ?  ?Postural control: Posterior lean, Right lateral lean ?Standing balance support: Single extremity supported, During functional activity ?Standing balance-Leahy Scale: Zero ?  ?  ?  ?  ?  ?  ?  ?  ?  ?  ?  ?  ?   ? ?  ADL either performed or assessed with clinical judgement  ? ?ADL Overall ADL's : Needs assistance/impaired ?  ?  ?Grooming: Wash/dry hands;Wash/dry face;Maximal assistance ?  ?Upper Body Bathing: Maximal assistance ?Upper Body Bathing Details  (indicate cue type and reason): anticipated ?Lower Body Bathing: Maximal assistance ?Lower Body Bathing Details (indicate cue type and reason): anticipated ?Upper Body Dressing : Maximal assistance ?Upper Body Dressing Details (indicate cue type and reason): anticipated ?Lower Body Dressing: Maximal assistance ?  ?  ?  ?Toileting- Clothing Manipulation and Hygiene: Maximal assistance ?  ?  ?  ?  ?General ADL Comments: Pt generally resistive to mobility however performs supine>sit with MIN A after staing "I have to pee", STS performed with MIN A however pt not appropriate to take steps at this time as he has with poor awareness of lines/attempting to full at foley therefore returned to supine.  ? ? ? ?Vision Baseline Vision/History: 1 Wears glasses ?   ?   ?Perception   ?  ?Praxis   ?  ? ?Pertinent Vitals/Pain Pain Assessment ?Pain Assessment: Faces ?Faces Pain Scale: Hurts a little bit ?Pain Location: generalized ?Pain Descriptors / Indicators: Grimacing, Discomfort ?Pain Intervention(s): Repositioned, Monitored during session  ? ? ? ?Hand Dominance   ?  ?Extremity/Trunk Assessment Upper Extremity Assessment ?Upper Extremity Assessment: Generalized weakness;Difficult to assess due to impaired cognition ?  ?Lower Extremity Assessment ?Lower Extremity Assessment: Generalized weakness;Difficult to assess due to impaired cognition ?  ?Cervical / Trunk Assessment ?Cervical / Trunk Assessment:  (rounded shoulders) ?  ?Communication   ?  ?Cognition Arousal/Alertness: Awake/alert ?Behavior During Therapy: [facility] for tasks assessed/performed ?Overall Cognitive Status: History of cognitive impairments - at baseline ?Area of Impairment: Orientation, Attention, Memory, Following commands, Safety/judgement, Awareness, Problem solving ?  ?  ?  ?  ?  ?  ?  ?  ?Orientation Level: Disoriented to, Place, Time, Situation ?Current Attention Level: Focused ?Memory: Decreased short-term memory ?Following Commands: Follows one step commands  inconsistently ?Safety/Judgement: Decreased awareness of safety, Decreased awareness of deficits ?Awareness: Intellectual ?Problem Solving: Slow processing, Decreased initiation, Difficulty sequencing, Requires verbal cues, Requires tactile cues ?General Comments: Pt responds to name, however when asked to state name he says "no"; Pt agitated at times, stating he needs to get out of bed to pee, redirected with step by step vcs, will follow wife's directives ?  ?  ?General Comments  Pt with R mitt off, L mitt donned, pulling at foley. RN aware and request R mitt to be placed back on. RN notified of blood tinged urine in foley bag. ? ?  ?Exercises Other Exercises ?Other Exercises: edu pt and wife re: role of rehab, role of OT< techniques to assist with orientation (lights on during the day, shades up, orienting patient) ?  ?Shoulder Instructions    ? ? ?Home Living Family/patient expects to be discharged to:: Private residence ?Living Arrangements: Spouse/significant other ?Available Help at Discharge: Family;Available 24 hours/day ?Type of Home: House ?Home Access: Stairs to enter ?Entrance Stairs-Number of Steps: 2 ?Entrance Stairs-Rails: Right ?Home Layout: Two level;Able to live on main level with bedroom/bathroom ?  ?  ?Bathroom Shower/Tub: Walk-in shower ?  ?  ?  ?  ?Home Equipment: Shower seat;Grab bars - tub/shower ?  ?  ?  ? ?  ?Prior Functioning/Environment   ?  ?  ?  ?  ?  ?  ?Mobility Comments: amb without AD, history of falls. ?ADLs Comments: MOD I-I for grooming, feeding, LB dressing, UB dressing  with intermittent assist with buttoning; MOD I for bathing per wife report. ?  ? ?  ?  ?OT Problem List: Decreased strength;Decreased activity tolerance;Cardiopulmonary status limiting activity;Impaired balance (sitting and/or standing);Decreased safety awareness ?  ?   ?OT Treatment/Interventions: Self-care/ADL training;Therapeutic exercise;Patient/family education;Balance training;Energy conservation;DME  and/or AE instruction;Cognitive remediation/compensation;Therapeutic activities  ?  ?OT Goals(Current goals can be found in the care plan section) Acute Rehab OT Goals ?OT Goal Formulation: Patient unable

## 2021-07-07 NOTE — Progress Notes (Signed)
?  Progress Note ? ? ?Patient: Scott Guzman OJJ:[id number] DOB: 15-Apr-1940 DOA: 07/05/2021     2 ?DOS: the patient was seen and examined on 07/07/2021 ?  ?Brief hospital course: ?No notes on file ? ?Assessment and Plan: ?* Acute metabolic encephalopathy ?Patient with altered mental status on top of baseline dementia, likely secondary to acute illness, renal colic, AKI  ?Plan: ?--start seroquel 50 mg nightly ?--IV haldol PRN ? ?Hydronephrosis with urinary obstruction due to ureteral calculus, left ?--s/p bilateral stent placement by urology ?Plan: ?--cont Flomax ?--cont Foley for now ? ?AKI (acute kidney injury) ([place]) ?Postrenal secondary to obstructive calculus in left ureter ?Urology consult to follow ?Plan: ?--cont NS'@75'$  ? ?Hypertensive urgency ?Could be in part related to pain. Not on home anti-hypertensives ?Plan: ?--IV hydralazine PRN for now ? ?Chronic anticoagulation ?Continue Xarelto ? ?Falls, initial encounter ?Likely related to altered mentation ?No evidence of acute injury on extensive trauma imaging ?--PT/OT ? ?Anemia ?Hemoglobin 11.7, down from baseline of 13.9.  Possibly hemodilutional from hydration in the ED on 4/11 as well as possible blood loss from hematuria ?Continue to monitor ? ?Atrial fibrillation with controlled ventricular response ([place]) ?Not on rate control agent at home ?--cont Xarelto ? ?Dementia ([place]) ?Delirium precautions ?Continue mirtazapine ? ? ? ? ?  ? ?Subjective:  ?Pt had been confused and agitated, trying to get out of bed, received Haldol with improvement. ? ? ?Physical Exam: ? ?Constitutional: NAD, sleeping ?CV: No cyanosis.   ?RESP: normal respiratory effort, on RA ?Extremities: No effusions, edema in BLE ?SKIN: warm, dry ? ? ?Data Reviewed: ? ?Family Communication: wife updated at bedside today ? ?Disposition: ?Status is: Inpatient ? ? Planned Discharge Destination: Rehab ? ? ? ?Time spent: 50 minutes ? ?Author: ?Enzo Bi, MD ?07/07/2021 7:24 PM ? ?For on call review  [email or web address].  ?

## 2021-07-07 NOTE — TOC Initial Note (Signed)
Transition of Care (TOC) - Initial/Assessment Note  ? ? ?Patient Details  ?Name: Scott Guzman ?MRN: [id number] ?Date of Birth: [birth year] ? ?Transition of Care (TOC) CM/SW Contact:    ?Harriet Masson, RN ?Phone Number: [phone] ?07/07/2021, 5:20 PM ? ?Clinical Narrative:                 ?Spoke with the bedside nurse and received recommendations for placement via SNF. RN spoke directly with the spouse [name]) and verified pt's disposition vi discharge recommendations. Spouse provided permission to start bed search with no limitations on facility choices. RN completed PASSR# based upon pt able to identify himself (person) however not really comprehending conversations and alertness. Spouse open to [place] SNF facilities. Bed search started today for placement.  ? ?TOC will continue to follow up with any offers and request authorization with any available bed offered. ? ?Expected Discharge Plan: [place] ?Barriers to Discharge: Continued Medical Work up ? ? ?Patient Goals and CMS Choice ?  ?CMS Medicare.gov Compare Post Acute Care list provided to:: Patient ?Choice offered to / list presented to : Spouse ? ?Expected Discharge Plan and Services ?Expected Discharge Plan: [place] ?  ?Discharge Planning Services: CM Consult ?Post Acute Care Choice: [place] ?Living arrangements for the past 2 months: [place] ?                ?  ?  ?  ?  ?  ?  ?  ?  ?  ?  ? ?Prior Living Arrangements/Services ?Living arrangements for the past 2 months: [place] ?Lives with:: Spouse ?Patient language and need for interpreter reviewed:: Yes ?       ?Need for Family Participation in Patient Care: Yes (Comment) ?  ?  ?  ? ?Activities of Daily Living ?Home Assistive Devices/Equipment: None ?ADL Screening (condition at time of admission) ?Patient's cognitive ability adequate to safely complete daily activities?: No ?Is the patient deaf or have difficulty  hearing?: No ?Does the patient have difficulty seeing, even when wearing glasses/contacts?: No ?Does the patient have difficulty concentrating, remembering, or making decisions?: Yes ?Patient able to express need for assistance with ADLs?: No ?Does the patient have difficulty dressing or bathing?: Yes ?Independently performs ADLs?: Yes (appropriate for developmental age) ?Does the patient have difficulty walking or climbing stairs?: Yes ?Weakness of Legs: Both ?Weakness of Arms/Hands: None ? ?Permission Sought/Granted ?  ?Permission granted to share information with : Yes, Verbal Permission Granted ?   ?   ?   ?   ? ?Emotional Assessment ?Appearance:: Appears stated age ?Attitude/Demeanor/Rapport: Unable to Assess (spoke with the pt's spouse [name])) ?Affect (typically observed): Unable to Assess ?Orientation: : Oriented to Self ?  ?Psych Involvement: No (comment) ? ?Admission diagnosis:  Kidney stone [N20.0] ?AKI (acute kidney injury) ([place]) [N17.9] ?Fall, initial encounter [W19.XXXA] ?Altered mental status, unspecified altered mental status type [R41.82] ?High serum thyroid stimulating hormone (TSH) [R79.89] ?Acute metabolic encephalopathy [C16.60] ?Patient Active Problem List  ? Diagnosis Date Noted  ? Acute metabolic encephalopathy 63/03/6008  ? Hypertensive urgency 07/05/2021  ? Falls, initial encounter 07/05/2021  ? Dementia ([place]) 07/05/2021  ? Chronic anticoagulation 07/05/2021  ? AKI (acute kidney injury) ([place]) 07/05/2021  ? Nephrolithiasis 07/05/2021  ? Hydronephrosis with urinary obstruction due to ureteral calculus, left 07/05/2021  ? Anemia 03/29/2021  ? Other fatigue 03/29/2021  ? Hyperthyroidism 10/27/2020  ? Constipation 10/27/2020  ? Memory loss 08/01/2020  ? Orthostatic hypotension  08/01/2020  ? Vertigo 08/01/2020  ? Excessive daytime sleepiness 08/01/2020  ? Other thrombophilia ([place]) 03/04/2020  ? Aortic atherosclerosis ([place]) 03/04/2020  ? CKD (chronic kidney disease) stage 3, GFR 30-59 ml/min  ([name]) 03/03/2020  ? Coronary artery disease 12/04/2018  ? Abnormality of gait 07/16/2017  ? Essential hypertension 07/16/2017  ? 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 Syncope and collapse 07/16/2017  ? Headache 07/16/2017  ? Cerebral ventriculomegaly 06/11/2017  ? Atrial fibrillation with controlled ventricular response ([place]) 06/03/2017  ? History of colon cancer 02/07/2017  ? History of compression fracture of spine 01/13/2017  ? DOE (dyspnea on exertion) 08/19/2016  ? Temporal arteritis ([place]) 03/12/2016  ? BPH (benign prostatic hyperplasia) 03/12/2016  ? Elevated erythrocyte sedimentation rate 01/29/2016  ? Insomnia 10/31/2014  ? Hypercholesterolemia 10/13/2014  ? ?PCP:  [name], MD ?Pharmacy:   ?[facility], [place] ?[place] [address] ?Phone: [phone] Fax: [phone] ? ?[id number] [address] ?[address] ?[place]?Phone: [phone]: [phone]? ?[facility] [address] [address] ?[place]?Phone: [phone]: [phone]? ? ? ? ?Social Determinants of Health (SDOH) Interventions ?  ? ?Readmission Risk Interventions ?   ? View : No data to display.  ?  ?  ?  ? ? ? ?

## 2021-07-08 DIAGNOSIS — G9341 Metabolic encephalopathy: Secondary | ICD-10-CM | POA: Diagnosis not present

## 2021-07-08 DIAGNOSIS — R319 Hematuria, unspecified: Secondary | ICD-10-CM

## 2021-07-08 LAB — URINE CULTURE: Culture: NO GROWTH

## 2021-07-08 LAB — CBC
HCT: 37.7 % — ABNORMAL LOW (ref 39.0–52.0)
Hemoglobin: 12.4 g/dL — ABNORMAL LOW (ref 13.0–17.0)
MCH: 29.9 pg (ref 26.0–34.0)
MCHC: 32.9 g/dL (ref 30.0–36.0)
MCV: 90.8 fL (ref 80.0–100.0)
Platelets: 178 10*3/uL (ref 150–400)
RBC: 4.15 MIL/uL — ABNORMAL LOW (ref 4.22–5.81)
RDW: 13.2 % (ref 11.5–15.5)
WBC: 6.4 10*3/uL (ref 4.0–10.5)
nRBC: 0 % (ref 0.0–0.2)

## 2021-07-08 LAB — BASIC METABOLIC PANEL
Anion gap: 8 (ref 5–15)
BUN: 13 mg/dL (ref 8–23)
CO2: 27 mmol/L (ref 22–32)
Calcium: 8.6 mg/dL — ABNORMAL LOW (ref 8.9–10.3)
Chloride: 104 mmol/L (ref 98–111)
Creatinine, Ser: 1.12 mg/dL (ref 0.61–1.24)
GFR, Estimated: >60 mL/min (ref >60)
Glucose, Bld: 98 mg/dL (ref 70–99)
Potassium: 4.5 mmol/L (ref 3.5–5.1)
Sodium: 139 mmol/L (ref 135–145)

## 2021-07-08 LAB — MAGNESIUM: Magnesium: 1.9 mg/dL (ref 1.7–2.4)

## 2021-07-08 MED ORDER — HYDRALAZINE HCL 50 MG PO TABS
50.0000 mg | ORAL_TABLET | Freq: Three times a day (TID) | ORAL | Status: DC
  Administered 2021-07-08 – 2021-07-13 (×15): 50 mg via ORAL
  Filled 2021-07-08 (×18): qty 1

## 2021-07-08 MED ORDER — POLYETHYLENE GLYCOL 3350 17 G PO PACK
34.0000 g | PACK | Freq: Two times a day (BID) | ORAL | Status: DC
  Administered 2021-07-08: 17 g via ORAL
  Administered 2021-07-08 – 2021-07-10 (×3): 34 g via ORAL
  Filled 2021-07-08 (×4): qty 2

## 2021-07-08 MED ORDER — QUETIAPINE FUMARATE 25 MG PO TABS
75.0000 mg | ORAL_TABLET | Freq: Every day | ORAL | Status: DC
  Administered 2021-07-08: 75 mg via ORAL
  Filled 2021-07-08 (×2): qty 3

## 2021-07-08 NOTE — Progress Notes (Signed)
?  Progress Note ? ? ?Patient: Scott Guzman:[id number] DOB: 1940-09-12 DOA: 07/05/2021     3 ?DOS: the patient was seen and examined on 07/08/2021 ?  ?Brief hospital course: ?No notes on file ? ?Assessment and Plan: ?* Acute metabolic encephalopathy ?Patient with altered mental status on top of baseline dementia, likely secondary to acute illness, renal colic, AKI  ?Plan: ?--increase seroquel to '75mg'$  nightly ?--IV haldol PRN ? ?Hydronephrosis with urinary obstruction due to ureteral calculus, left ?--s/p bilateral stent placement by urology ?Plan: ?--cont Flomax ?--cont Foley for now ? ?AKI (acute kidney injury) ([place]) ?Postrenal secondary to obstructive calculus in left ureter ?Urology consult to follow ?Plan: ?--cont NS'@75'$  ? ?Hypertensive urgency ?Could be in part related to pain. Not on home anti-hypertensives ?Plan: ?--start oral hydralazine 50 mg q8h ? ?Chronic anticoagulation ?Hold Xarelto due to worsening hematuria ? ?Falls, initial encounter ?Likely related to altered mentation ?No evidence of acute injury on extensive trauma imaging ?--PT/OT ? ?Anemia ?Hemoglobin 11.7, down from baseline of 13.9.  Possibly hemodilutional from hydration in the ED on 4/11 as well as possible blood loss from hematuria ?Continue to monitor ? ?Atrial fibrillation with controlled ventricular response ([place]) ?Not on rate control agent at home ?--hold Xarelto due to worsening hematuria ? ?Hematuria ?--likely 2/2 urological manipulation in the setting of taking Xarelto ?--hold home Xarelto for now ?--cont MIVF'@75'$  to flush  ? ?Dementia ([place]) ?Delirium precautions ?Continue mirtazapine ? ? ? ? ?  ? ?Subjective:  ?Pt slept better last night.  Also asleep during this morning.   ?Hematuria worsened. ? ? ?Physical Exam: ? ?Constitutional: NAD, sleeping ?CV: No cyanosis.   ?RESP: normal respiratory effort, on RA ?Extremities: No effusions, edema in BLE ?SKIN: warm, dry ?Foley present, with bright red urine ? ? ?Data Reviewed: ? ?Family  Communication:  ?Disposition: ?Status is: Inpatient ? ? Planned Discharge Destination: Rehab ? ? ? ?Time spent: 35 minutes ? ?Author: ?Enzo Bi, MD ?07/08/2021 3:24 PM ? ?For on call review [email or web address]  ?

## 2021-07-08 NOTE — TOC Progression Note (Signed)
Transition of Care (TOC) - Progression Note  ? ? ?Patient Details  ?Name: Scott Guzman ?MRN: [id number] ?Date of Birth: 04-01-1940 ? ?Transition of Care (TOC) CM/SW Contact  ?Harriet Masson, RN ?Phone [phone] ?07/08/2021, 12:11 PM ? ?Clinical Narrative:    ?RN spoke with  pt spouse today who is requesting a family member to be involved with the decision made for placement of this pt. RN explained the process as discussed yesterday that TOC would start the bed search for rehab facilities however no beds offered at this time. Once a bed is offered a family decision can take place at that time for acceptance to the facility. No further needs and all questions addressed. ? ?TOC will follow up with any bed offers. ? ? ?Expected Discharge Plan: [place] ?Barriers to Discharge: Continued Medical Work up ? ?Expected Discharge Plan and Services ?Expected Discharge Plan: [place] ?  ?Discharge Planning Services: CM Consult ?Post Acute Care Choice: [place] ?Living arrangements for the past 2 months: [place] ?                ?  ?  ?  ?  ?  ?  ?  ?  ?  ?  ? ? ?Social Determinants of Health (SDOH) Interventions ?  ? ?Readmission Risk Interventions ?   ? View : No data to display.  ?  ?  ?  ? ? ?

## 2021-07-08 NOTE — Plan of Care (Signed)
  Problem: Clinical Measurements: Goal: Diagnostic test results will improve Outcome: Progressing   

## 2021-07-08 NOTE — Assessment & Plan Note (Addendum)
--  likely 2/2 urological manipulation and stents in the setting of taking Xarelto ?Plan: ?--Hold Xarelto due to hematuria, resume after ureteral stents are removed. ?--d/c MIVF  ?

## 2021-07-09 ENCOUNTER — Inpatient Hospital Stay: Admit: 2021-07-09 | Payer: Medicare Other

## 2021-07-09 DIAGNOSIS — G9341 Metabolic encephalopathy: Secondary | ICD-10-CM | POA: Diagnosis not present

## 2021-07-09 DIAGNOSIS — N201 Calculus of ureter: Secondary | ICD-10-CM

## 2021-07-09 LAB — BASIC METABOLIC PANEL
Anion gap: 10 (ref 5–15)
BUN: 13 mg/dL (ref 8–23)
CO2: 26 mmol/L (ref 22–32)
Calcium: 8.8 mg/dL — ABNORMAL LOW (ref 8.9–10.3)
Chloride: 103 mmol/L (ref 98–111)
Creatinine, Ser: 1.17 mg/dL (ref 0.61–1.24)
GFR, Estimated: >60 mL/min (ref >60)
Glucose, Bld: 106 mg/dL — ABNORMAL HIGH (ref 70–99)
Potassium: 4.1 mmol/L (ref 3.5–5.1)
Sodium: 139 mmol/L (ref 135–145)

## 2021-07-09 LAB — CBC
HCT: 40.5 % (ref 39.0–52.0)
Hemoglobin: 13.3 g/dL (ref 13.0–17.0)
MCH: 29.6 pg (ref 26.0–34.0)
MCHC: 32.8 g/dL (ref 30.0–36.0)
MCV: 90.2 fL (ref 80.0–100.0)
Platelets: 198 10*3/uL (ref 150–400)
RBC: 4.49 MIL/uL (ref 4.22–5.81)
RDW: 13 % (ref 11.5–15.5)
WBC: 8.5 10*3/uL (ref 4.0–10.5)
nRBC: 0 % (ref 0.0–0.2)

## 2021-07-09 LAB — CULTURE, URINE COMPREHENSIVE

## 2021-07-09 LAB — MAGNESIUM: Magnesium: 1.9 mg/dL (ref 1.7–2.4)

## 2021-07-09 MED ORDER — METOPROLOL SUCCINATE ER 50 MG PO TB24
50.0000 mg | ORAL_TABLET | Freq: Every day | ORAL | Status: DC
  Administered 2021-07-09 – 2021-07-13 (×4): 50 mg via ORAL
  Filled 2021-07-09 (×5): qty 1

## 2021-07-09 NOTE — Progress Notes (Signed)
?  Progress Note ? ? ?Patient: Scott Guzman:[id number] DOB: 1940-04-10 DOA: 07/05/2021     4 ?DOS: the patient was seen and examined on 07/09/2021 ?  ?Brief hospital course: ?No notes on file ? ?Assessment and Plan: ?* Acute metabolic encephalopathy ?Patient with altered mental status on top of baseline dementia, likely secondary to acute illness, renal colic, AKI  ?Plan: ?--cont seroquel to '75mg'$  nightly ?--IV haldol PRN ? ?Hydronephrosis with urinary obstruction due to ureteral calculus, left ?--s/p bilateral stent placement by urology ?Plan: ?--cont Flomax ?--cont Foley for now ?--outpatient f/u with urology for stent removal ? ?AKI (acute kidney injury) ([place]) ?Postrenal secondary to obstructive calculus in left ureter ?--Cr 2.58 on presentation, improved to baseline 1.1. ?Urology consulted ?Plan: ?--cont NS'@75'$  ? ?Hypertensive urgency ?Could be in part related to pain. Not on home anti-hypertensives ?Plan: ?--cont oral hydralazine 50 mg q8h (new) ?--start Toprol 50 mg daily (new) ? ?Chronic anticoagulation ?Hold Xarelto due to worsening hematuria ? ?Falls, initial encounter ?Likely related to altered mentation ?No evidence of acute injury on extensive trauma imaging ?--PT/OT ? ?Anemia ?Hemoglobin 11.7, down from baseline of 13.9.  Possibly hemodilutional from hydration in the ED on 4/11 as well as possible blood loss from hematuria ?Continue to monitor ? ?Atrial fibrillation with controlled ventricular response ([place]) ?Not on rate control agent at home ?--hold Xarelto due to worsening hematuria ? ?Hematuria ?--likely 2/2 urological manipulation and stents in the setting of taking Xarelto ?--hold home Xarelto until stents are removed ?--cont MIVF'@75'$  to flush  ? ?Dementia ([place]) ?Delirium precautions ?Continue mirtazapine ? ? ? ? ?  ? ?Subjective:  ?Pt was more awake today and able to answer questions. ? ? ?Physical Exam: ? ?Constitutional: NAD, more alert today, oriented to self only ?HEENT: conjunctivae and lids  normal, EOMI ?CV: No cyanosis.   ?RESP: normal respiratory effort, on RA ?Extremities: No effusions, edema in BLE ?SKIN: warm, dry ?Neuro: II - XII grossly intact.   ? ?Foley present, with bright red urine ? ? ?Data Reviewed: ? ?Family Communication: wife updated at bedside today ? ?Disposition: ?Status is: Inpatient ? ? Planned Discharge Destination: Rehab ? ? ? ?Time spent: 50 minutes ? ?Author: ?Enzo Bi, MD ?07/09/2021 8:10 PM ? ?For on call review [email or web address]  ?

## 2021-07-09 NOTE — Care Management Important Message (Signed)
Important Message ? ?Patient Details  ?Name: Scott Guzman ?MRN: [id number] ?Date of Birth: [birth year] ? ? ?Medicare Important Message Given:  Yes ? ? ? ? ?Dannette Barbara ?07/09/2021, 1:13 PM ?

## 2021-07-09 NOTE — Progress Notes (Signed)
Physical Therapy Treatment ?Patient Details ?Name: Scott Guzman ?MRN: [id number] ?DOB: [birth year] ?Today's Date: 07/09/2021 ? ? ?History of Present Illness Pt is a 81 y/o M who was seen in the ED on 0/27/25 for renal colic, was treated & discharged. Pt returned to the ED on 07/05/21 for evaluation of confusion & 2 falls. CT revealed 5 mm obstructive ureteral calculus on the left with hydroureteronephrosis. Pt is s/p cystoscopy & left ureteral stent placement on 07/06/21. PMH: CAD, a-fib on amiodarone & xarelto, dementia, HTN, colon CA, dysphagia, HLD, HTN, PVC, thyroid disease ? ?  ?PT Comments  ? ? Pt seen for PT tx with pt's wife present for session. Pt demonstrates slight improvement in cognition as pt able to follow cuing to initiate some mobility tasks during session, is recalling & asking about his dog at home, and is more alert & engaged during session. Pt requires supervision<>mod assist for supine<>sit & min assist for STS with HHA. Attempted steps to counter/sink but pt requires BUE support with PT placing hands on counter & pt able to take steps with max cuing. Pt requires +2 skilled assist with BUE HHA to attempt gait at this time. Encouraged pt to wash face standing at sink but pt unable to turn on water or wash face, but was able to wash face once sitting EOB. Will continue to follow pt acutely to address balance, activity tolerance, and gait with LRAD. ?   ?Recommendations for follow up therapy are one component of a multi-disciplinary discharge planning process, led by the attending physician.  Recommendations may be updated based on patient status, additional functional criteria and insurance authorization. ? ?Follow Up Recommendations ? Skilled nursing-short term rehab (<3 hours/day) ?  ?  ?Assistance Recommended at Discharge Frequent or constant Supervision/Assistance  ?Patient can return home with the following Two people to help with walking and/or transfers;Assistance with feeding;Two people to help  with bathing/dressing/bathroom;Assistance with cooking/housework;Direct supervision/assist for financial management;Assist for transportation;Direct supervision/assist for medications management;Help with stairs or ramp for entrance ?  ?Equipment Recommendations ?  (TBD in next venue)  ?  ?Recommendations for Other Services   ? ? ?  ?Precautions / Restrictions Precautions ?Precautions: Fall ?Restrictions ?Weight Bearing Restrictions: No  ?  ? ?Mobility ? Bed Mobility ?Overal bed mobility: Needs Assistance ?Bed Mobility: Supine to Sit, Sit to Supine ?  ?  ?Supine to sit: Supervision, HOB elevated (use of bed rails, extra time & cuing to scoot to sitting EOB) ?Sit to supine: Mod assist (assistance to elevate BLE onto bed) ?  ?  ?  ? ?Transfers ?Overall transfer level: Needs assistance ?Equipment used: 1 person hand held assist ?Transfers: Sit to/from Stand ?Sit to Stand: Min assist ?  ?  ?  ?  ?  ?General transfer comment: Pt with improved initiation of STS from EOB. ?  ? ?Ambulation/Gait ?  ?  ?  ?  ?  ?  ?  ?  ? ? ?Stairs ?  ?  ?  ?  ?  ? ? ?Wheelchair Mobility ?  ? ?Modified Rankin (Stroke Patients Only) ?  ? ? ?  ?Balance Overall balance assessment: Needs assistance ?Sitting-balance support: Bilateral upper extremity supported, Feet supported ?Sitting balance-Leahy Scale: Fair ?Sitting balance - Comments: supervision static sitting EOB ?  ?Standing balance support: Single extremity supported, Bilateral upper extremity supported, During functional activity ?Standing balance-Leahy Scale: Poor ?  ?  ?  ?  ?  ?  ?  ?  ?  ?  ?  ?  ?  ? ?  ?  Cognition Arousal/Alertness: Awake/alert ?Behavior During Therapy: Flat affect ?Overall Cognitive Status: History of cognitive impairments - at baseline ?Area of Impairment: Orientation, Attention, Memory, Following commands, Safety/judgement, Awareness, Problem solving ?  ?  ?  ?  ?  ?  ?  ?  ?Orientation Level: Disoriented to, Place, Time, Situation ?  ?Memory: Decreased short-term  memory ?Following Commands: Follows one step commands inconsistently, Follows one step commands with increased time ?Safety/Judgement: Decreased awareness of safety, Decreased awareness of deficits ?Awareness: Intellectual ?Problem Solving: Slow processing, Decreased initiation, Difficulty sequencing, Requires verbal cues, Requires tactile cues ?General Comments: Cognition is slowly improving as pt was able to follow simple commands better compared to last time this PT saw him, recalled & was asking about his dog, verbally communicated at times. ?  ?  ? ?  ?Exercises   ? ?  ?General Comments   ?  ?  ? ?Pertinent Vitals/Pain Pain Assessment ?Pain Assessment: Faces ?Faces Pain Scale: No hurt  ? ? ?Home Living   ?  ?  ?  ?  ?  ?  ?  ?  ?  ?   ?  ?Prior Function    ?  ?  ?   ? ?PT Goals (current goals can now be found in the care plan section) Acute Rehab PT Goals ?Patient Stated Goal: get better ?PT Goal Formulation: With family ?Time For Goal Achievement: 07/21/21 ?Potential to Achieve Goals: Fair ?Progress towards PT goals: Progressing toward goals ? ?  ?Frequency ? ? ? Min 2X/week ? ? ? ?  ?PT Plan Current plan remains appropriate  ? ? ?Co-evaluation   ?  ?  ?  ?  ? ?  ?AM-PAC PT "6 Clicks" Mobility   ?Outcome Measure ? Help needed turning from your back to your side while in a flat bed without using bedrails?: A Little ?Help needed moving from lying on your back to sitting on the side of a flat bed without using bedrails?: A Lot ?Help needed moving to and from a bed to a chair (including a wheelchair)?: A Lot ?Help needed standing up from a chair using your arms (e.g., wheelchair or bedside chair)?: A Lot ?Help needed to walk in hospital room?: Total ?Help needed climbing 3-5 steps with a railing? : Total ?6 Click Score: 11 ? ?  ?End of Session   ?Activity Tolerance: Patient tolerated treatment well ?Patient left: in bed;with call bell/phone within reach;with bed alarm set;with family/visitor present (BUE mittens  donned & 4 rails up per wife request) ?  ?PT Visit Diagnosis: Unsteadiness on feet (R26.81);Muscle weakness (generalized) (M62.81);Difficulty in walking, not elsewhere classified (R26.2) ?  ? ? ?Time: 8101-7510 ?PT Time Calculation (min) (ACUTE ONLY): 15 min ? ?Charges:  $Therapeutic Activity: 8-22 mins          ?          ? ?Lavone Nian, PT, DPT ?07/09/21, 11:31 AM ? ? ?Waunita Schooner ?07/09/2021, 11:29 AM ? ?

## 2021-07-10 DIAGNOSIS — G9341 Metabolic encephalopathy: Secondary | ICD-10-CM | POA: Diagnosis not present

## 2021-07-10 LAB — BASIC METABOLIC PANEL
Anion gap: 9 (ref 5–15)
BUN: 14 mg/dL (ref 8–23)
CO2: 27 mmol/L (ref 22–32)
Calcium: 8.8 mg/dL — ABNORMAL LOW (ref 8.9–10.3)
Chloride: 106 mmol/L (ref 98–111)
Creatinine, Ser: 1.07 mg/dL (ref 0.61–1.24)
GFR, Estimated: >60 mL/min (ref >60)
Glucose, Bld: 94 mg/dL (ref 70–99)
Potassium: 4.3 mmol/L (ref 3.5–5.1)
Sodium: 142 mmol/L (ref 135–145)

## 2021-07-10 LAB — CBC
HCT: 39.2 % (ref 39.0–52.0)
Hemoglobin: 13 g/dL (ref 13.0–17.0)
MCH: 29.3 pg (ref 26.0–34.0)
MCHC: 33.2 g/dL (ref 30.0–36.0)
MCV: 88.3 fL (ref 80.0–100.0)
Platelets: 219 10*3/uL (ref 150–400)
RBC: 4.44 MIL/uL (ref 4.22–5.81)
RDW: 13.1 % (ref 11.5–15.5)
WBC: 6.5 10*3/uL (ref 4.0–10.5)
nRBC: 0 % (ref 0.0–0.2)

## 2021-07-10 LAB — MAGNESIUM: Magnesium: 1.9 mg/dL (ref 1.7–2.4)

## 2021-07-10 MED ORDER — QUETIAPINE FUMARATE 25 MG PO TABS
50.0000 mg | ORAL_TABLET | Freq: Every day | ORAL | Status: DC
  Administered 2021-07-10: 50 mg via ORAL
  Filled 2021-07-10: qty 2

## 2021-07-10 NOTE — Progress Notes (Signed)
?  Progress Note ? ? ?Patient: Scott Guzman ITG:[id number] DOB: 09/18/1940 DOA: 07/05/2021     5 ?DOS: the patient was seen and examined on 07/10/2021 ?  ?Brief hospital course: ?[name] is a 81 y.o. male with medical history significant for CAD, A-fib on Xarelto, dementia, HTN, seen in the ED 2 days ago on 11/21/9405 for renal colic, treated and discharged who was brought to the emergency room for evaluation of confusion and 2 falls, 1 of which was witnessed by his wife. ? ?Pt was found to have AKI with findings significant for a 5 mm obstructive ureteral calculus on the left with hydroureteronephrosis. ? ? ? ?Assessment and Plan: ?* Acute metabolic encephalopathy ?Patient with altered mental status on top of baseline dementia, likely secondary to acute illness, renal colic, AKI  ?--also may have hospital delirium ?Plan: ?--reduce seroquel to '50mg'$  nightly ?--IV haldol PRN ? ?Hydronephrosis with urinary obstruction due to ureteral calculus, left ?--s/p bilateral stent placement by urology ?Plan: ?--cont Flomax ?--d/c Foley today for voiding trial ?--outpatient f/u with urology for stent removal (scheduled for this Friday if pt is discharged before then) ? ?AKI (acute kidney injury) ([name]) ?Postrenal secondary to obstructive calculus in left ureter ?--Cr 2.58 on presentation, improved to baseline 1.1. ?Urology consulted ?Plan: ?--d/c MIVF today ? ?Hypertensive urgency ?Could be in part related to pain. Not on home anti-hypertensives ?Plan: ?--cont oral hydralazine 50 mg q8h (new) ?--cont Toprol 50 mg daily (new) ? ?Chronic anticoagulation ?Hold Xarelto due to hematuria, resume after ureteral stents are removed. ? ?Falls, initial encounter ?Likely related to altered mentation ?No evidence of acute injury on extensive trauma imaging ?--PT/OT ? ?Anemia ?Hemoglobin 11.7, down from baseline of 13.9.  Possibly hemodilutional from hydration in the ED on 4/11 as well as possible blood loss from hematuria ?Continue to  monitor ? ?Atrial fibrillation with controlled ventricular response ([place]) ?Not on rate control agent at home ?--Hold Xarelto due to hematuria, resume after ureteral stents are removed. ?--cont Toprol (new) ? ?Hematuria ?--likely 2/2 urological manipulation and stents in the setting of taking Xarelto ?Plan: ?--Hold Xarelto due to hematuria, resume after ureteral stents are removed. ?--d/c MIVF  ? ?Dementia ([name]) ?Delirium precautions ?Continue mirtazapine ? ? ? ? ?  ? ?Subjective:  ?Pt was found covered in stool today. ? ?Foley removed today for voiding trial. ? ? ?Physical Exam: ? ?Constitutional: NAD, confused, covered in stool ?HEENT: conjunctivae and lids normal, EOMI ?CV: No cyanosis.   ?RESP: normal respiratory effort, on RA ? ?Foley present, with yellow urine with red tinge ? ? ?Data Reviewed: ? ?Family Communication:  ?Disposition: ?Status is: Inpatient ? ? Planned Discharge Destination: Rehab whenever bed available  ? ? ? ?Time spent: 35 minutes ? ?Author: ?Enzo Bi, MD ?07/10/2021 8:42 PM ? ?For on call review [email or web address]  ?

## 2021-07-10 NOTE — Hospital Course (Signed)
Scott Guzman is a 81 y.o. male with medical history significant for CAD, A-fib on Xarelto, dementia, HTN, seen in the ED 2 days ago on 1/43/8887 for renal colic, treated and discharged who was brought to the emergency room for evaluation of confusion and 2 falls, 1 of which was witnessed by his wife. ? ?Pt was found to have AKI with findings significant for a 5 mm obstructive ureteral calculus on the left with hydroureteronephrosis. ? ? ?

## 2021-07-10 NOTE — TOC Progression Note (Signed)
Transition of Care (TOC) - Progression Note  ? ? ?Patient Details  ?Name: Scott Guzman ?MRN: [id number] ?Date of Birth: 27-Dec-1940 ? ?Transition of Care (TOC) CM/SW Contact  ?Pete Pelt, RN ?Phone Number: ?07/10/2021, 3:22 PM ? ?Clinical Narrative:   Patient has bed offers from [facility], [facility] and [facility].  Message left for spouse re: choice of facilities.  TOC to follow. ? ? ? ?Expected Discharge Plan: [place] ?Barriers to Discharge: Continued Medical Work up ? ?Expected Discharge Plan and Services ?Expected Discharge Plan: [place] ?  ?Discharge Planning Services: CM Consult ?Post Acute Care Choice: [place] ?Living arrangements for the past 2 months: [place] ?                ?  ?  ?  ?  ?  ?  ?  ?  ?  ?  ? ? ?Social Determinants of Health (SDOH) Interventions ?  ? ?Readmission Risk Interventions ?   ? View : No data to display.  ?  ?  ?  ? ? ?

## 2021-07-10 NOTE — Progress Notes (Signed)
OT Cancellation Note ? ?Patient Details ?Name: Scott Guzman ?MRN: [id number] ?DOB: 1940/04/17 ? ? ?Cancelled Treatment:    Reason Eval/Treat Not Completed: Other (comment). Upon attempt, pt with nursing staff for patient care, unavailable for OT tx. Will re-attempt at later date/time as appropriate.  ? ?Ardeth Perfect., MPH, MS, OTR/L ?ascom [phone] ?07/10/21, 3:47 PM ?

## 2021-07-10 NOTE — Plan of Care (Signed)
?  Problem: Education: ?Goal: Knowledge of General Education information will improve ?Description: Including pain rating scale, medication(s)/side effects and non-pharmacologic comfort measures ?Outcome: Progressing ?  ?Problem: Clinical Measurements: ?Goal: Ability to maintain clinical measurements within normal limits will improve ?Outcome: Progressing ?Goal: Respiratory complications will improve ?Outcome: Progressing ?  ?Problem: Activity: ?Goal: Risk for activity intolerance will decrease ?Outcome: Progressing ?  ?Problem: Nutrition: ?Goal: Adequate nutrition will be maintained ?Outcome: Progressing ?  ?Problem: Coping: ?Goal: Level of anxiety will decrease ?Outcome: Progressing ?  ?Problem: Elimination: ?Goal: Will not experience complications related to bowel motility ?Outcome: Progressing ?Goal: Will not experience complications related to urinary retention ?Outcome: Progressing ?  ?Problem: Pain Managment: ?Goal: General experience of comfort will improve ?Outcome: Progressing ?  ?

## 2021-07-10 NOTE — NC FL2 (Signed)
?[place] MEDICAID FL2 LEVEL OF CARE SCREENING TOOL  ?  ? ?IDENTIFICATION  ?Patient Name: ?Scott Guzman Birthdate: April 28, 1940 Sex: male Admission Date (Current Location): ?07/05/2021  ?[place] and [place] Number: ? [place] ?  Facility and Address:  ?[facility], [address] [place], [place] [address] ?     Provider Number: ?[id number]  ?Attending Physician Name and Address:  ?Enzo Bi, MD ? Relative Name and Phone Number:  ?Dontai Pember [phone] ?   ?Current Level of Care: ?SNF Recommended Level of Care: ?[place] Prior Approval Number: ?  ? ?Date Approved/Denied: ?  PASRR Number: ?[id number] A ? ?Discharge Plan: ?SNF ?  ? ?Current Diagnoses: ?Patient Active Problem List  ? Diagnosis Date Noted  ? Ureteral stone   ? Hematuria 07/08/2021  ? Acute metabolic encephalopathy 74/02/8785  ? Hypertensive urgency 07/05/2021  ? Falls, initial encounter 07/05/2021  ? Dementia ([place]) 07/05/2021  ? Chronic anticoagulation 07/05/2021  ? AKI (acute kidney injury) ([place]) 07/05/2021  ? Nephrolithiasis 07/05/2021  ? Hydronephrosis with urinary obstruction due to ureteral calculus, left 07/05/2021  ? Anemia 03/29/2021  ? Other fatigue 03/29/2021  ? Hyperthyroidism 10/27/2020  ? Constipation 10/27/2020  ? Memory loss 08/01/2020  ? Orthostatic hypotension 08/01/2020  ? Vertigo 08/01/2020  ? Excessive daytime sleepiness 08/01/2020  ? Other thrombophilia ([place]) 03/04/2020  ? Aortic atherosclerosis ([place]) 03/04/2020  ? CKD (chronic kidney disease) stage 3, GFR 30-59 ml/min ([name]) 03/03/2020  ? Abnormality of gait 07/16/2017  ? Essential hypertension 07/16/2017  ? 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 Syncope and collapse 07/16/2017  ? Headache 07/16/2017  ? Cerebral ventriculomegaly 06/11/2017  ? Atrial fibrillation with controlled ventricular response ([place]) 06/03/2017  ? History of colon cancer 02/07/2017  ? History of compression fracture of spine 01/13/2017  ? DOE (dyspnea on  exertion) 08/19/2016  ? 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 BPH (benign prostatic hyperplasia) 03/12/2016  ? Elevated erythrocyte sedimentation rate 01/29/2016  ? Insomnia 10/31/2014  ? Hypercholesterolemia 10/13/2014  ? ? ?Orientation RESPIRATION BLADDER Height & Weight   ?  ?Self ? Normal Indwelling catheter Weight: 61.2 kg ?Height:  '5\' 9"'$  (175.3 cm)  ?BEHAVIORAL SYMPTOMS/MOOD NEUROLOGICAL BOWEL NUTRITION STATUS  ?    Incontinent Diet  ?AMBULATORY STATUS COMMUNICATION OF NEEDS Skin   ?Limited Assist Verbally (However limited comphrension) Other (Comment) (foam dressing to hygienic sites) ?  ?  ?  ?    ?     ?     ? ? ?Personal Care Assistance Level of Assistance  ?Bathing, Feeding, Dressing Bathing Assistance: Limited assistance ?Feeding assistance: Limited assistance ?Dressing Assistance: Limited assistance ?   ? ?Functional Limitations Info  ?    ?  ?   ? ? ?SPECIAL CARE FACTORS FREQUENCY  ?PT (By licensed PT), OT (By licensed OT)   ?  ?  ?  ?  ?  ?  ?   ? ? ?Contractures Contractures Info: Not present  ? ? ?Additional Factors Info  ?Code Status, Allergies Code Status Info: FULL code ?Allergies Info: No known allergies ?  ?  ?  ?   ? ?Current Medications (07/10/2021):  This is the current hospital active medication list ?Current Facility-Administered Medications  ?Medication Dose Route Frequency Provider Last Rate Last Admin  ? 0.9 %  sodium chloride infusion   Intravenous Continuous Fritzi Mandes, MD 75 mL/hr at 07/10/21 1057 New Bag at 07/10/21 1057  ? acetaminophen (TYLENOL) tablet 650 mg  650 mg Oral Q6H PRN Damita Dunnings,  Waldemar Dickens, MD   650 mg at 07/09/21 1639  ? Or  ? acetaminophen (TYLENOL) suppository 650 mg  650 mg Rectal Q6H PRN Athena Masse, MD      ? Chlorhexidine Gluconate Cloth 2 % PADS 6 each  6 each Topical Daily Kathryne Eriksson, NP   6 each at 07/10/21 [phone]  ? docusate (COLACE) 50 MG/5ML liquid 50 mg  50 mg Oral Daily PRN Fritzi Mandes, MD      ? haloperidol lactate (HALDOL) injection 2 mg  2 mg  Intravenous Q6H PRN Fritzi Mandes, MD   2 mg at 07/10/21 0021  ? hydrALAZINE (APRESOLINE) injection 10 mg  10 mg Intravenous Q6H PRN Fritzi Mandes, MD   10 mg at 07/06/21 1538  ? hydrALAZINE (APRESOLINE) tablet 50 mg  50 mg Oral Q8H Enzo Bi, MD   50 mg at 07/10/21 0950  ? LORazepam (ATIVAN) injection 0.5 mg  0.5 mg Intravenous Q12H PRN Fritzi Mandes, MD   0.5 mg at 07/09/21 1837  ? metoprolol succinate (TOPROL-XL) 24 hr tablet 50 mg  50 mg Oral Daily Enzo Bi, MD   50 mg at 07/10/21 0950  ? mirtazapine (REMERON) tablet 30 mg  30 mg Oral QHS Judd Gaudier V, MD   30 mg at 07/08/21 2254  ? nitroGLYCERIN (NITROSTAT) SL tablet 0.4 mg  0.4 mg Sublingual Q5 Min x 3 PRN Athena Masse, MD      ? ondansetron [facility]) tablet 4 mg  4 mg Oral Q6H PRN Athena Masse, MD      ? Or  ? ondansetron [facility]) injection 4 mg  4 mg Intravenous Q6H PRN Athena Masse, MD      ? pantoprazole (PROTONIX) EC tablet 40 mg  40 mg Oral Daily Fritzi Mandes, MD   40 mg at 07/10/21 0951  ? polyethylene glycol (MIRALAX / GLYCOLAX) packet 34 g  34 g Oral BID Enzo Bi, MD   34 g at 07/10/21 0949  ? pyridostigmine (MESTINON) tablet 60 mg  60 mg Oral TID Athena Masse, MD   60 mg at 07/10/21 0951  ? QUEtiapine (SEROQUEL) tablet 50 mg  50 mg Oral QHS Enzo Bi, MD      ? tamsulosin [facility]) capsule 0.4 mg  0.4 mg Oral Daily Judd Gaudier V, MD   0.4 mg at 07/09/21 1639  ? traMADol (ULTRAM) tablet 50 mg  50 mg Oral Q6H PRN Fritzi Mandes, MD   50 mg at 07/08/21 0221  ? ? ? ?Discharge Medications: ?Please see discharge summary for a list of discharge medications. ? ?Relevant Imaging Results: ? ?Relevant Lab Results: ? ? ?Additional Information ?SSN [id number] ? ?Pete Pelt, RN ? ? ? ? ?

## 2021-07-10 NOTE — Anesthesia Postprocedure Evaluation (Signed)
Anesthesia Post Note ? ?Patient: Scott Guzman ? ?Procedure(s) Performed: CYSTOSCOPY/URETEROSCOPY/HOLMIUM LASER/STENT PLACEMENT (Bilateral) ? ?Patient location during evaluation: PACU ?Anesthesia Type: General ?Level of consciousness: awake and alert ?Pain management: pain level controlled ?Vital Signs Assessment: post-procedure vital signs reviewed and stable ?Respiratory status: spontaneous breathing, nonlabored ventilation, respiratory function stable and patient connected to nasal cannula oxygen ?Cardiovascular status: blood pressure returned to baseline and stable ?Postop Assessment: no apparent nausea or vomiting ?Anesthetic complications: no ? ? ?No notable events documented. ? ? ?Last Vitals:  ?Vitals:  ? 07/09/21 2134 07/10/21 0414  ?BP:  (!) 170/90  ?Pulse: 75 71  ?Resp:  16  ?Temp: 36.7 ?C 36.5 ?C  ?SpO2: 100% 98%  ?  ?Last Pain:  ?Vitals:  ? 07/10/21 0414  ?TempSrc: Axillary  ?PainSc:   ? ? ?  ?  ?  ?  ?  ?  ? ?Molli Barrows ? ? ? ? ?

## 2021-07-11 DIAGNOSIS — G9341 Metabolic encephalopathy: Secondary | ICD-10-CM | POA: Diagnosis not present

## 2021-07-11 LAB — CALCULI, WITH PHOTOGRAPH (CLINICAL LAB)
Calcium Oxalate Dihydrate: 20 %
Calcium Oxalate Monohydrate: 80 %
Weight Calculi: 2 mg

## 2021-07-11 MED ORDER — HALOPERIDOL 2 MG PO TABS
2.0000 mg | ORAL_TABLET | Freq: Four times a day (QID) | ORAL | Status: DC | PRN
  Administered 2021-07-11: 2 mg via ORAL
  Filled 2021-07-11 (×2): qty 1

## 2021-07-11 MED ORDER — ORAL CARE MOUTH RINSE
15.0000 mL | Freq: Two times a day (BID) | OROMUCOSAL | Status: DC
  Administered 2021-07-11 – 2021-07-13 (×4): 15 mL via OROMUCOSAL

## 2021-07-11 MED ORDER — QUETIAPINE FUMARATE 25 MG PO TABS
25.0000 mg | ORAL_TABLET | Freq: Two times a day (BID) | ORAL | Status: DC
  Administered 2021-07-11 – 2021-07-13 (×4): 25 mg via ORAL
  Filled 2021-07-11 (×5): qty 1

## 2021-07-11 NOTE — TOC Progression Note (Signed)
Transition of Care (TOC) - Progression Note  ? ? ?Patient Details  ?Name: Scott Guzman ?MRN: [id number] ?Date of Birth: 07-16-1940 ? ?Transition of Care (TOC) CM/SW Contact  ?Pete Pelt, RN ?Phone Number: ?07/11/2021, 3:03 PM ? ?Clinical Narrative:   Patient spouse and son at bedside.  RNCM provided bed offers of [facility], [facility] and [place] to family along with ratings from [email or web address]  They would like to tour facilities and will get back to [facility] with decision.  TOC contact information provided TOC to follow. ? ? ? ?Expected Discharge Plan: [place] ?Barriers to Discharge: Continued Medical Work up ? ?Expected Discharge Plan and Services ?Expected Discharge Plan: [place] ?  ?Discharge Planning Services: CM Consult ?Post Acute Care Choice: [place] ?Living arrangements for the past 2 months: [place] ?                ?  ?  ?  ?  ?  ?  ?  ?  ?  ?  ? ? ?Social Determinants of Health (SDOH) Interventions ?  ? ?Readmission Risk Interventions ?   ? View : No data to display.  ?  ?  ?  ? ? ?

## 2021-07-11 NOTE — Progress Notes (Signed)
?PROGRESS NOTE ? ? ? ?Scott Guzman  FXT:[id number] DOB: 1940-03-26 DOA: 07/05/2021 ?PCP: [name], MD  ? ? ?Brief Narrative:  ?81 year old with history of coronary artery disease, A-fib on Xarelto, dementia, hypertension admitted with renal colic, confusion and fall at home witnessed by his wife.  He was seen 2 days ago in the emergency room by urology and discharged.  He was found with AKI, 5 mm obstructive ureteral calculus on the left with hydroureteronephrosis.  Treated with cystoscopy and stenting.  Remains in the hospital with confusion, need for placement. ? ? ?Assessment & Plan: ?  ?Acute metabolic encephalopathy in a patient with baseline dementia, multiple medical issues. ?Delirium precautions.  Fall precautions. ?Using IV haloperidol and IV Ativan, discontinue. ?Seroquel 25 mg twice daily, start using haloperidol 2 mg every 6 hours by mouth for agitation and delirium.  Frequent reorientation. ? ?Hydronephrosis with urinary obstruction due to ureteral calculus on the left: ?Status post bilateral stent placement by urology ?Continue Flomax, successful voiding trial.  Urology will schedule follow-up.  Received antibiotics and completed.  Urine culture with no significant growth. ? ?Acute kidney injury: Improved. ? ?Essential hypertension, at elevated blood pressures: Not on treatment at home.  Significantly elevated.  Started on hydralazine and Toprol with improvement.  Will need prescription on discharge. ? ?Chronic anticoagulation on Xarelto: Held due to hematuria.  Patient with clear urine now.  Will resume after urology follow-up. ? ?Atrial fibrillation: Not on rate control medications at home.  Started on Toprol.  Continue.  Xarelto on hold until urology follow-up. ? ? ?DVT prophylaxis: SCDs Start: 07/05/21 2318 ? ? ?Code Status: Full code ?Family Communication: , Called patient's wife.  She did not pick up.  Goes to male voice message. ?Disposition Plan: Status is: Inpatient ?Remains inpatient  appropriate because: needing SNF ?  ? ? ?Consultants:  ?Urology  ? ?Procedures:  ?cysto ? ?Antimicrobials:  ?Completed ? ? ?Subjective: ?Patient seen and examined.  Overnight noted to be slight impulsiveness, agitation.  Reoriented easily.  ?Pleasantly confused.  Afebrile. ?No issues voiding. ? ?Objective: ?Vitals:  ? 07/10/21 1943 07/11/21 0616 07/11/21 0714 07/11/21 1038  ?BP: (!) 151/82 125/86 (!) 158/139 113/73  ?Pulse: 72 75 73 69  ?Resp: '16 14 16   '$ ?Temp: (!) 97.5 ?F (36.4 ?C) 97.8 ?F (36.6 ?C) 97.9 ?F (36.6 ?C)   ?TempSrc: Oral Oral    ?SpO2: 98%  100%   ?Weight:      ?Height:      ? ? ?Intake/Output Summary (Last 24 hours) at 07/11/2021 1107 ?Last data filed at 07/10/2021 1935 ?Gross per 24 hour  ?Intake 864.25 ml  ?Output 775 ml  ?Net 89.25 ml  ? ?Filed Weights  ? 07/06/21 0046 07/06/21 1223  ?Weight: 61.2 kg 61.2 kg  ? ? ?Examination: ? ?General: Pleasantly confused.  On room air.  Mittens in place.  Moves all extremities. ?Cardiovascular: S1-S2 normal.  Regular rate rhythm. ?Respiratory: Bilateral clear ?Gastrointestinal: Soft.  Nontender.  Bowel sound present ?Ext: No edema.  No cyanosis.  No clubbing. ? ? ? ?Data Reviewed: I have personally reviewed following labs and imaging studies ? ?CBC: ?Recent Labs  ?Lab 07/05/21 ?1947 07/06/21 ?2992 07/08/21 ?4268 07/09/21 ?3419 07/10/21 ?6222  ?WBC 8.6 9.4 6.4 8.5 6.5  ?HGB 11.7* 13.3 12.4* 13.3 13.0  ?HCT 36.7* 41.3 37.7* 40.5 39.2  ?MCV 91.3 89.6 90.8 90.2 88.3  ?PLT 179 189 178 198 219  ? ?Basic Metabolic Panel: ?Recent Labs  ?Lab 07/06/21 ?9798 07/06/21 ?  1616 07/08/21 ?7048 07/09/21 ?8891 07/10/21 ?6945  ?NA 136 134* 139 139 142  ?K 4.3 4.1 4.5 4.1 4.3  ?CL 101 101 104 103 106  ?CO2 '27 23 27 26 27  '$ ?GLUCOSE 142* 119* 98 106* 94  ?BUN 28* '22 13 13 14  '$ ?CREATININE 2.39* 1.76* 1.12 1.17 1.07  ?CALCIUM 8.7* 8.1* 8.6* 8.8* 8.8*  ?MG  --   --  1.9 1.9 1.9  ? ?GFR: ?Estimated Creatinine Clearance: 49.3 mL/min (by C-G formula based on SCr of 1.07 mg/dL). ?Liver  Function Tests: ?Recent Labs  ?Lab 07/05/21 ?1947  ?AST 26  ?ALT 12  ?ALKPHOS 63  ?BILITOT 1.0  ?PROT 6.6  ?ALBUMIN 3.8  ? ?No results for input(s): LIPASE, AMYLASE in the last 168 hours. ?Recent Labs  ?Lab 07/05/21 ?1947  ?AMMONIA 17  ? ?Coagulation Profile: ?Recent Labs  ?Lab 07/05/21 ?1947  ?INR 1.2  ? ?Cardiac Enzymes: ?No results for input(s): CKTOTAL, CKMB, CKMBINDEX, TROPONINI in the last 168 hours. ?BNP (last 3 results) ?No results for input(s): PROBNP in the last 8760 hours. ?HbA1C: ?No results for input(s): HGBA1C in the last 72 hours. ?CBG: ?Recent Labs  ?Lab 07/05/21 ?2039  ?GLUCAP 100*  ? ?Lipid Profile: ?No results for input(s): CHOL, HDL, LDLCALC, TRIG, CHOLHDL, LDLDIRECT in the last 72 hours. ?Thyroid Function Tests: ?No results for input(s): TSH, T4TOTAL, FREET4, T3FREE, THYROIDAB in the last 72 hours. ?Anemia Panel: ?No results for input(s): VITAMINB12, FOLATE, FERRITIN, TIBC, IRON, RETICCTPCT in the last 72 hours. ?Sepsis Labs: ?No results for input(s): PROCALCITON, LATICACIDVEN in the last 168 hours. ? ?Recent Results (from the past 240 hour(s))  ?CULTURE, URINE COMPREHENSIVE     Status: Abnormal  ? Collection Time: 07/05/21 10:36 AM  ? Specimen: Urine  ? UR  ?Result Value Ref Range Status  ? Urine Culture, Comprehensive Final report (A)  Final  ? Organism ID, Bacteria Comment (A)  Final  ?  Comment: Staphylococcus epidermidis ?Based on resistance to oxacillin this isolate would be resistant to ?all currently available beta-lactam antimicrobial agents, with the ?exception of the newer cephalosporins with anti-MRSA activity, such as ?Ceftaroline ?8,000  Colonies/mL ?  ? Organism ID, Bacteria Comment (A)  Final  ?  Comment: Staphylococcus haemolyticus ?Based on susceptibility to oxacillin this isolate would be ?susceptible to: ?*Penicillinase-stable penicillins, such as: ?  Cloxacillin, Dicloxacillin, Nafcillin ?*Beta-lactam combination agents, such as: ?  Amoxicillin-clavulanic acid,  Ampicillin-sulbactam, ?  Piperacillin-tazobactam ?*Oral cephems, such as: ?  Cefaclor, Cefdinir, Cefpodoxime, Cefprozil, Cefuroxime, ?  Cephalexin, Loracarbef ?*Parenteral cephems, such as: ?  Cefazolin, Cefepime, Cefotaxime, Cefotetan, Ceftaroline, ?  Ceftizoxime, Ceftriaxone, Cefuroxime ?*Carbapenems, such as: ?  Doripenem, Ertapenem, Imipenem, Meropenem ?Most isolates of Staphylococcus sp. produce a beta-lactamase enzyme ?rendering them resistant to penicillin. Please contact the laboratory ?if penicillin is being considered for therapy. ?3,000 Colonies/mL ?  ? ANTIMICROBIAL SUSCEPTIBILITY Comment  Final  ?  Comment:       ** S = Susceptible; I = Intermediate; R = Resistant ** ?                   P = Positive; N = Negative ?            MICS are expressed in micrograms per mL ?   Antibiotic                 RSLT#1    RSLT#2    RSLT#3    RSLT#4 ?Ciprofloxacin  S         S ?Gentamicin                     S         S ?Levofloxacin                   S         S ?Linezolid                      S         S ?Moxifloxacin                   S         S ?Nitrofurantoin                 S         S ?Oxacillin                      R         S ?Penicillin                     R ?Quinupristin/Dalfopristin      S ?Rifampin                       S         S ?Tetracycline                   S         S ?Trimethoprim/Sulfa             S         S ?Vancomycin                     S         S ?  ?Microscopic Examination     Status: Abnormal  ? Collection Time: 07/05/21 10:36 AM  ? Urine  ?Result Value Ref Range Status  ? WBC, UA 0-5 0 - 5 /hpf Final  ? RBC 11-30 (A) 0 - 2 /hpf Final  ? Epithelial Cells (non renal) 0-10 0 - 10 /hpf Final  ? Bacteria, UA Few None seen/Few Final  ?Resp Panel by RT-PCR (Flu A&B, Covid) Nasopharyngeal Swab     Status: None  ? Collection Time: 07/05/21  8:42 PM  ? Specimen: Nasopharyngeal Swab; Nasopharyngeal(NP) swabs in vial transport medium  ?Result Value Ref Range Status  ? SARS Coronavirus  2 by RT PCR NEGATIVE NEGATIVE Final  ?  Comment: (NOTE) ?SARS-CoV-2 target nucleic acids are NOT DETECTED. ? ?The SARS-CoV-2 RNA is generally detectable in upper respiratory ?specimens during the acute phase of infection. The

## 2021-07-11 NOTE — Progress Notes (Signed)
Occupational Therapy Treatment ?Patient Details ?Name: Scott Guzman ?MRN: [id number] ?DOB: 07-23-1940 ?Today's Date: 07/11/2021 ? ? ?History of present illness Pt is a 81 y/o M who was seen in the ED on 03/26/70 for renal colic, was treated & discharged. Pt returned to the ED on 07/05/21 for evaluation of confusion & 2 falls. CT revealed 5 mm obstructive ureteral calculus on the left with hydroureteronephrosis. Pt is s/p cystoscopy & left ureteral stent placement on 07/06/21. PMH: CAD, a-fib on amiodarone & xarelto, dementia, HTN, colon CA, dysphagia, HLD, HTN, PVC, thyroid disease ?  ?OT comments ? Chart reviewed to date, RN cleared pt for particpatoin in OT tx sessoin. Co tx completed with PT On this date. Wife and son present throughout tx session.Pt requires step by step vcs throughout for participation in all mobility/ADL tasks.  Tx session targeted improving participatoin in mobility/ADL tasks to faciltate return to PLOF. Pt with improtved tolerance for OOB task as compared to evaluation with transfer to beside chair with MOD A +2 HHA. ADL tasks commpleted with MAX-TOTAL A. Pt continues to present with funcitonal deficits affecting safe ADL completion. Discharge recommendation remains appropriate. OT will continue to follow acutely.   ? ?Recommendations for follow up therapy are one component of a multi-disciplinary discharge planning process, led by the attending physician.  Recommendations may be updated based on patient status, additional functional criteria and insurance authorization. ?   ?Follow Up Recommendations ? Skilled nursing-short term rehab (<3 hours/day)  ?  ?Assistance Recommended at Discharge Frequent or constant Supervision/Assistance  ?Patient can return home with the following ? A lot of help with walking and/or transfers;A lot of help with bathing/dressing/bathroom;Assistance with cooking/housework;Direct supervision/assist for financial management;Assist for transportation;Assistance with  feeding;Direct supervision/assist for medications management;Help with stairs or ramp for entrance ?  ?Equipment Recommendations ?    ?  ?Recommendations for Other Services   ? ?  ?Precautions / Restrictions Precautions ?Precautions: Fall ?Restrictions ?Weight Bearing Restrictions: No  ? ? ?  ? ?Mobility Bed Mobility ?Overal bed mobility: Needs Assistance ?Bed Mobility: Supine to Sit ?  ?  ?Supine to sit: Max assist, +2 for physical assistance ?  ?  ?  ?  ? ?Transfers ?Overall transfer level: Needs assistance ?Equipment used: 2 person hand held assist ?Transfers: Sit to/from Stand ?Sit to Stand: Mod assist ?  ?  ?  ?  ?  ?  ?  ?  ?Balance Overall balance assessment: Needs assistance ?Sitting-balance support: Bilateral upper extremity supported, Feet supported ?Sitting balance-Leahy Scale: Fair ?  ?Postural control: Posterior lean, Right lateral lean ?Standing balance support: Single extremity supported, Bilateral upper extremity supported, During functional activity ?Standing balance-Leahy Scale: Poor ?  ?  ?  ?  ?  ?  ?  ?  ?  ?  ?  ?  ?   ? ?ADL either performed or assessed with clinical judgement  ? ?ADL Overall ADL's : Needs assistance/impaired ?  ?  ?Grooming: Wash/dry hands;Wash/dry face;Minimal assistance;Sitting ?  ?  ?  ?  ?  ?Upper Body Dressing : Maximal assistance;Sitting ?  ?Lower Body Dressing: Bed level;Maximal assistance ?  ?Toilet Transfer: Moderate assistance;+2 for physical assistance;Ambulation ?Toilet Transfer Details (indicate cue type and reason): +2 HHA simulated to bedside chair ?Toileting- Clothing Manipulation and Hygiene: Total assistance ?Toileting - Clothing Manipulation Details (indicate cue type and reason): peri care following BM ?  ?  ?Functional mobility during ADLs: Moderate assistance;+2 for physical assistance ?General ADL Comments: step by  step vcs to keep eyes open during mobility/ADL task completion; ?  ? ?Extremity/Trunk Assessment   ?  ?  ?  ?  ?  ? ?Vision   ?  ?   ?Perception   ?  ?Praxis   ?  ? ?Cognition Arousal/Alertness: Suspect due to medications ?Behavior During Therapy: Flat affect ?Overall Cognitive Status: History of cognitive impairments - at baseline ?Area of Impairment: Orientation, Attention, Following commands, Safety/judgement, Memory, Awareness, Problem solving ?  ?  ?  ?  ?  ?  ?  ?  ?Orientation Level: Disoriented to, Place, Time, Situation ?Current Attention Level: Focused ?Memory: Decreased short-term memory ?Following Commands: Follows one step commands inconsistently, Follows one step commands with increased time ?Safety/Judgement: Decreased awareness of safety, Decreased awareness of deficits ?Awareness: Intellectual ?Problem Solving: Slow processing, Decreased initiation, Difficulty sequencing, Requires verbal cues, Requires tactile cues ?General Comments: Pt continues to perform below PLOF in terms of cognitive function; pt stating "where is my asparagus" ?  ?  ?   ?Exercises Other Exercises ?Other Exercises: edu pt, wife, son: re: role of OT, dishcarge recommedations, home safety, techniques to assist with orientation, removing mitts/glasses ? ?  ?Shoulder Instructions   ? ? ?  ?General Comments Pt with mitts on at start of session. RN Beth ok'd mitts off at end of session, pt not pulling at lines/leads at end of session. Skin color change on L side of nose from glasses, appears to be redness but potential opening at that area. RN notified and aware.pt left with glasses off, but near to him. Family educated on findings.  ? ? ?Pertinent Vitals/ Pain       Pain Assessment ?Pain Assessment: Faces ?Pain Score: 0-No pain ? ?Home Living   ?  ?  ?  ?  ?  ?  ?  ?  ?  ?  ?  ?  ?  ?  ?  ?  ?  ?  ? ?  ?Prior Functioning/Environment    ?  ?  ?  ?   ? ?Frequency ? Min 2X/week  ? ? ? ? ?  ?Progress Toward Goals ? ?OT Goals(current goals can now be found in the care plan section) ? Progress towards OT goals: Progressing toward goals ? ?Acute Rehab OT Goals ?OT  Goal Formulation: Patient unable to participate in goal setting  ?Plan     ? ?Co-evaluation ? ? ? PT/OT/SLP Co-Evaluation/Treatment: Yes ?Reason for Co-Treatment: To address functional/ADL transfers;Necessary to address cognition/behavior during functional activity;For patient/therapist safety ?PT goals addressed during session: Mobility/safety with mobility;Balance ?OT goals addressed during session: ADL's and self-care ?  ? ?  ?AM-PAC OT "6 Clicks" Daily Activity     ?Outcome Measure ? ? Help from another person eating meals?: A Lot ?Help from another person taking care of personal grooming?: A Lot ?Help from another person toileting, which includes using toliet, bedpan, or urinal?: Total ?Help from another person bathing (including washing, rinsing, drying)?: A Lot ?Help from another person to put on and taking off regular upper body clothing?: A Lot ?Help from another person to put on and taking off regular lower body clothing?: A Lot ?6 Click Score: 11 ? ?  ?End of Session Equipment Utilized During Treatment: Gait belt ? ?OT Visit Diagnosis: Unsteadiness on feet (R26.81);History of falling (Z91.81);Muscle weakness (generalized) (M62.81) ?  ?Activity Tolerance Patient tolerated treatment well ?  ?Patient Left in chair;with call bell/phone within reach;with chair alarm set;with family/visitor present ?  ?  Nurse Communication Mobility status;Other (comment) (R bridge of nose skin change due to glasses) ?  ? ?   ? ?Time: 1337-1410 ?OT Time Calculation (min): 33 min ? ?Charges: OT General Charges ?$OT Visit: 1 Visit ?OT Treatments ?$Self Care/Home Management : 8-22 mins ? ?Shanon Payor, OTD OTR/L  ?07/11/21, 2:58 PM  ?

## 2021-07-11 NOTE — Progress Notes (Signed)
Physical Therapy Treatment ?Patient Details ?Name: Scott Guzman ?MRN: [id number] ?DOB: [birth year] ?Today's Date: 07/11/2021 ? ? ?History of Present Illness Pt is a 81 y/o M who was seen in the ED on 7/78/24 for renal colic, was treated & discharged. Pt returned to the ED on 07/05/21 for evaluation of confusion & 2 falls. CT revealed 5 mm obstructive ureteral calculus on the left with hydroureteronephrosis. Pt is s/p cystoscopy & left ureteral stent placement on 07/06/21. PMH: CAD, a-fib on amiodarone & xarelto, dementia, HTN, colon CA, dysphagia, HLD, HTN, PVC, thyroid disease ? ?  ?PT Comments  ? ? Co-Treat with OT. Pt received in bed alert but confused. Family present and consented to proceed with physical therapy. Pt participated in Sup<->sit, STS, Bed->Chair transfers with mod assist of 2. Pt ambulated in room 5 ft with HHA of 2 with VCs to open eyes and directions. Pt tolerated treatment well and will benefit form subacute skilled rehab to help pt return to PLOF so that the wife can provide care at home.   ?Recommendations for follow up therapy are one component of a multi-disciplinary discharge planning process, led by the attending physician.  Recommendations may be updated based on patient status, additional functional criteria and insurance authorization. ? ?Follow Up Recommendations ?   ?  ?  ?Assistance Recommended at Discharge Frequent or constant Supervision/Assistance  ?Patient can return home with the following Two people to help with walking and/or transfers;Assistance with feeding;Two people to help with bathing/dressing/bathroom;Assistance with cooking/housework;Direct supervision/assist for financial management;Assist for transportation;Direct supervision/assist for medications management;Help with stairs or ramp for entrance ?  ?Equipment Recommendations ? None recommended by PT  ?  ?Recommendations for Other Services   ? ? ?  ?Precautions / Restrictions Precautions ?Precautions:  Fall ?Restrictions ?Weight Bearing Restrictions: No  ?  ? ?Mobility ? Bed Mobility ?Overal bed mobility: Needs Assistance ?Bed Mobility: Supine to Sit ?  ?  ?Supine to sit: Max assist ?  ?  ?  ?  ? ?Transfers ?Overall transfer level: Needs assistance ?Equipment used: 2 person hand held assist ?Transfers: Sit to/from Stand, Bed to chair/wheelchair/BSC ?Sit to Stand: Mod assist ?  ?  ?  ?  ?  ?General transfer comment: Pt needs VC for direction, to popen eyes. ?  ? ?Ambulation/Gait ?Ambulation/Gait assistance: +2 physical assistance, Mod assist ?Gait Distance (Feet): 5 Feet ?  ?Gait Pattern/deviations: Decreased step length - right, Decreased step length - left, Shuffle, Trunk flexed ?Gait velocity: slow ?  ?  ?General Gait Details: Pt slow with decreasedfoot clearance, heel strike, decreased awareness to surrounding and decreased gait endurance. ? ? ?Stairs ?  ?  ?  ?  ?  ? ? ?Wheelchair Mobility ?  ? ?Modified Rankin (Stroke Patients Only) ?  ? ? ?  ?Balance Overall balance assessment: Needs assistance ?Sitting-balance support: Bilateral upper extremity supported, Feet supported ?Sitting balance-Leahy Scale: Fair ?Sitting balance - Comments: min assist static sitting EOB ?Postural control: Posterior lean, Right lateral lean ?  ?Standing balance-Leahy Scale: Poor ?  ?  ?  ?  ?  ?  ?  ?  ?  ?  ?  ?  ?  ? ?  ?Cognition Arousal/Alertness: Lethargic (keeps eyes closed) ?Behavior During Therapy: Flat affect ?Overall Cognitive Status: History of cognitive impairments - at baseline ?Area of Impairment: Orientation, Attention, Memory, Following commands, Safety/judgement, Awareness, Problem solving ?  ?  ?  ?  ?  ?  ?  ?  ?Orientation Level:  Disoriented to, Place, Time, Situation ?Current Attention Level: Alternating ?Memory: Decreased short-term memory ?Following Commands: Follows one step commands inconsistently, Follows one step commands with increased time ?Safety/Judgement: Decreased awareness of safety, Decreased  awareness of deficits ?Awareness: Intellectual ?Problem Solving: Slow processing, Decreased initiation, Difficulty sequencing, Requires verbal cues, Requires tactile cues ?General Comments: Cognition is slowly improving as pt was able to follow simple commands better compared to last time this PT saw him, recalled & was asking about his dog, verbally communicated at times. ?  ?  ? ?  ?Exercises   ? ?  ?General Comments   ?  ?  ? ?Pertinent Vitals/Pain    ? ? ?Home Living   ?  ?  ?  ?  ?  ?  ?  ?  ?  ?   ?  ?Prior Function    ?  ?  ?   ? ?PT Goals (current goals can now be found in the care plan section) Acute Rehab PT Goals ?Patient Stated Goal: get better ?PT Goal Formulation: With family ?Time For Goal Achievement: 07/21/21 ?Potential to Achieve Goals: Fair ?Progress towards PT goals: Progressing toward goals ? ?  ?Frequency ? ? ? Min 2X/week ? ? ? ?  ?PT Plan Current plan remains appropriate  ? ? ?Co-evaluation PT/OT/SLP Co-Evaluation/Treatment: Yes ?Reason for Co-Treatment: Necessary to address cognition/behavior during functional activity;For patient/therapist safety;Complexity of the patient's impairments (multi-system involvement) ?PT goals addressed during session: Mobility/safety with mobility;Balance ?  ?  ? ?  ?AM-PAC PT "6 Clicks" Mobility   ?Outcome Measure ? Help needed turning from your back to your side while in a flat bed without using bedrails?: A Lot ?Help needed moving from lying on your back to sitting on the side of a flat bed without using bedrails?: A Lot ?Help needed moving to and from a bed to a chair (including a wheelchair)?: A Lot ?  ?Help needed to walk in hospital room?: A Lot ?Help needed climbing 3-5 steps with a railing? : Total ?6 Click Score: 9 ? ?  ?End of Session   ?Activity Tolerance: Patient tolerated treatment well ?Patient left: in chair;with family/visitor present;Other (comment) (OT continued with treatment) ?Nurse Communication: Mobility status ?PT Visit Diagnosis:  Unsteadiness on feet (R26.81);Muscle weakness (generalized) (M62.81);Difficulty in walking, not elsewhere classified (R26.2) ?  ? ? ?Time: 7680-8811 ?PT Time Calculation (min) (ACUTE ONLY): 16 min ? ?Charges:  $Therapeutic Activity: 8-22 mins          ?          ? ?Joaquin Music PT DPT ?2:34 PM,07/11/21 ? ? ? ?[facility] [place] [facility] Jonice Cerra ?07/11/2021, 2:26 PM ? ?

## 2021-07-11 NOTE — Plan of Care (Signed)
?  Problem: Safety: Goal: Non-violent Restraint(s) Outcome: Progressing   Problem: Education: Goal: Knowledge of General Education information will improve Description: Including pain rating scale, medication(s)/side effects and non-pharmacologic comfort measures Outcome: Progressing   Problem: Health Behavior/Discharge Planning: Goal: Ability to manage health-related needs will improve Outcome: Progressing   Problem: Clinical Measurements: Goal: Ability to maintain clinical measurements within normal limits will improve Outcome: Progressing Goal: Will remain free from infection Outcome: Progressing Goal: Diagnostic test results will improve Outcome: Progressing Goal: Respiratory complications will improve Outcome: Progressing Goal: Cardiovascular complication will be avoided Outcome: Progressing   

## 2021-07-12 ENCOUNTER — Other Ambulatory Visit: Payer: Self-pay | Admitting: Physician Assistant

## 2021-07-12 ENCOUNTER — Encounter: Payer: Self-pay | Admitting: Internal Medicine

## 2021-07-12 DIAGNOSIS — G9341 Metabolic encephalopathy: Secondary | ICD-10-CM | POA: Diagnosis not present

## 2021-07-12 DIAGNOSIS — N132 Hydronephrosis with renal and ureteral calculous obstruction: Secondary | ICD-10-CM | POA: Diagnosis not present

## 2021-07-12 DIAGNOSIS — Z7189 Other specified counseling: Secondary | ICD-10-CM | POA: Diagnosis not present

## 2021-07-12 DIAGNOSIS — Z515 Encounter for palliative care: Secondary | ICD-10-CM | POA: Diagnosis not present

## 2021-07-12 DIAGNOSIS — N201 Calculus of ureter: Secondary | ICD-10-CM

## 2021-07-12 MED ORDER — HYDRALAZINE HCL 50 MG PO TABS: 50.0000 mg | ORAL_TABLET | Freq: Three times a day (TID) | ORAL | 0 refills | Status: DC

## 2021-07-12 MED ORDER — HALOPERIDOL 2 MG PO TABS: 2.0000 mg | ORAL_TABLET | Freq: Four times a day (QID) | ORAL | 0 refills | Status: DC | PRN

## 2021-07-12 MED ORDER — QUETIAPINE FUMARATE 25 MG PO TABS
25.0000 mg | ORAL_TABLET | Freq: Two times a day (BID) | ORAL | 0 refills | Status: AC
Start: 2021-07-12 — End: 2021-08-11

## 2021-07-12 MED ORDER — SODIUM CHLORIDE 0.9 % IV SOLN: INTRAVENOUS | Status: DC

## 2021-07-12 MED ORDER — METOPROLOL SUCCINATE ER 50 MG PO TB24
50.0000 mg | ORAL_TABLET | Freq: Every day | ORAL | 0 refills | Status: AC
Start: 2021-07-12 — End: 2021-08-11

## 2021-07-12 MED ORDER — MIRTAZAPINE 30 MG PO TABS: 30.0000 mg | ORAL_TABLET | Freq: Every day | ORAL | 0 refills | Status: DC

## 2021-07-12 NOTE — Progress Notes (Signed)
?PROGRESS NOTE ? ? ? ?Scott Guzman  HFW:[id number] DOB: [birth year] DOA: 07/05/2021 ?PCP: [name], MD  ? ? ?Brief Narrative:  ?81 year old with history of coronary artery disease, A-fib on Xarelto, dementia, hypertension admitted with renal colic, confusion and fall at home witnessed by his wife.  He was seen 2 days ago in the emergency room by urology and discharged.  He was found with AKI, 5 mm obstructive ureteral calculus on the left with hydroureteronephrosis.  Treated with cystoscopy and stenting.  Remains in the hospital with confusion, need for placement. ? ? ?Assessment & Plan: ?  ?Acute metabolic encephalopathy in a patient with baseline dementia, multiple medical issues. ?Delirium precautions.  Fall precautions. ?Seroquel 25 mg twice daily, using haloperidol 2 mg every 6 hours by mouth for agitation and delirium.  Frequent reorientation. ?Relatively stable for last 24 hours. ? ?Hydronephrosis with urinary obstruction due to ureteral calculus on the left: ?Status post bilateral stent placement by urology ?Continue Flomax, successful voiding trial.  Urology will schedule follow-up.  Received antibiotics and completed.  Urine culture with no significant growth. ? ?Acute kidney injury: Improved. ? ?Essential hypertension, elevated blood pressures: Not on treatment at home.  Significantly elevated.  Started on hydralazine and Toprol with improvement.  Will prescribe on discharge. ? ?Chronic anticoagulation on Xarelto: Held due to hematuria.  Patient with clear urine now.  Will resume on discharge.  Urology planning to follow-up. ? ?Atrial fibrillation: Not on rate control medications at home.  Started on Toprol.  Continue.  Resume Xarelto on discharge. ? ?Goal of care: ?Discussed with patient's wife over the phone.  Patient with progressively worsening dementia.  She is agreeable to discuss with palliative care about goals of care, further follow-up as well. ?Patient was seen by neurology in the past, not  recently on any follow-up.  She will call his previous neurologist, [name] to schedule follow-up. ? ? ?DVT prophylaxis: SCDs Start: 07/05/21 2318 ? ? ?Code Status: Full code ?Family Communication: Patient's wife on the phone. ?Disposition Plan: Status is: Inpatient ?Remains inpatient appropriate because: needing SNF.  Medically stable. ?  ? ? ?Consultants:  ?Urology  ?Palliative ? ?Procedures:  ?cysto ? ?Antimicrobials:  ?Completed ? ? ?Subjective: ? ?Patient seen and examined.  No overnight events.  Remains slightly confused.  Today he was more interactive.  Remains extremely weak. ?Patient tells me he is hip is hurting, normal on examination. ? ?Objective: ?Vitals:  ? 07/12/21 0408 07/12/21 0277 07/12/21 4128 07/12/21 0957  ?BP: 126/70 (!) 101/49 132/70   ?Pulse: 79 82 81 88  ?Resp: 16 16    ?Temp: 97.7 ?F (36.5 ?C) (!) 97.5 ?F (36.4 ?C)    ?TempSrc: Oral Oral    ?SpO2: 99% 100% 97%   ?Weight:      ?Height:      ? ? ?Intake/Output Summary (Last 24 hours) at 07/12/2021 1308 ?Last data filed at 07/12/2021 0413 ?Gross per 24 hour  ?Intake --  ?Output 500 ml  ?Net -500 ml  ? ?Filed Weights  ? 07/06/21 0046 07/06/21 1223  ?Weight: 61.2 kg 61.2 kg  ? ? ?Examination: ? ?General: Looks fairly comfortable.  Slightly anxious and impulsive.  Alert and awake.  Oriented x1.  He is not oriented to time, place or person. ?Cardiovascular: S1-S2 normal with regular rate rhythm. ?Respiratory: Bilateral clear.  No added sounds. ?Gastrointestinal: Soft.  Nontender. ?Urine is clear collected in the urine bag. ?Ext: No deformities. ? ? ? ? ?Data Reviewed: I have  personally reviewed following labs and imaging studies ? ?CBC: ?Recent Labs  ?Lab 07/05/21 ?1947 07/06/21 ?5697 07/08/21 ?9480 07/09/21 ?1655 07/10/21 ?3748  ?WBC 8.6 9.4 6.4 8.5 6.5  ?HGB 11.7* 13.3 12.4* 13.3 13.0  ?HCT 36.7* 41.3 37.7* 40.5 39.2  ?MCV 91.3 89.6 90.8 90.2 88.3  ?PLT 179 189 178 198 219  ? ?Basic Metabolic Panel: ?Recent Labs  ?Lab 07/06/21 ?2707  07/06/21 ?1616 07/08/21 ?8675 07/09/21 ?4492 07/10/21 ?0100  ?NA 136 134* 139 139 142  ?K 4.3 4.1 4.5 4.1 4.3  ?CL 101 101 104 103 106  ?CO2 '27 23 27 26 27  '$ ?GLUCOSE 142* 119* 98 106* 94  ?BUN 28* '22 13 13 14  '$ ?CREATININE 2.39* 1.76* 1.12 1.17 1.07  ?CALCIUM 8.7* 8.1* 8.6* 8.8* 8.8*  ?MG  --   --  1.9 1.9 1.9  ? ?GFR: ?Estimated Creatinine Clearance: 49.3 mL/min (by C-G formula based on SCr of 1.07 mg/dL). ?Liver Function Tests: ?Recent Labs  ?Lab 07/05/21 ?1947  ?AST 26  ?ALT 12  ?ALKPHOS 63  ?BILITOT 1.0  ?PROT 6.6  ?ALBUMIN 3.8  ? ?No results for input(s): LIPASE, AMYLASE in the last 168 hours. ?Recent Labs  ?Lab 07/05/21 ?1947  ?AMMONIA 17  ? ?Coagulation Profile: ?Recent Labs  ?Lab 07/05/21 ?1947  ?INR 1.2  ? ?Cardiac Enzymes: ?No results for input(s): CKTOTAL, CKMB, CKMBINDEX, TROPONINI in the last 168 hours. ?BNP (last 3 results) ?No results for input(s): PROBNP in the last 8760 hours. ?HbA1C: ?No results for input(s): HGBA1C in the last 72 hours. ?CBG: ?Recent Labs  ?Lab 07/05/21 ?2039  ?GLUCAP 100*  ? ?Lipid Profile: ?No results for input(s): CHOL, HDL, LDLCALC, TRIG, CHOLHDL, LDLDIRECT in the last 72 hours. ?Thyroid Function Tests: ?No results for input(s): TSH, T4TOTAL, FREET4, T3FREE, THYROIDAB in the last 72 hours. ?Anemia Panel: ?No results for input(s): VITAMINB12, FOLATE, FERRITIN, TIBC, IRON, RETICCTPCT in the last 72 hours. ?Sepsis Labs: ?No results for input(s): PROCALCITON, LATICACIDVEN in the last 168 hours. ? ?Recent Results (from the past 240 hour(s))  ?CULTURE, URINE COMPREHENSIVE     Status: Abnormal  ? Collection Time: 07/05/21 10:36 AM  ? Specimen: Urine  ? UR  ?Result Value Ref Range Status  ? Urine Culture, Comprehensive Final report (A)  Final  ? Organism ID, Bacteria Comment (A)  Final  ?  Comment: Staphylococcus epidermidis ?Based on resistance to oxacillin this isolate would be resistant to ?all currently available beta-lactam antimicrobial agents, with the ?exception of the newer  cephalosporins with anti-MRSA activity, such as ?Ceftaroline ?8,000  Colonies/mL ?  ? Organism ID, Bacteria Comment (A)  Final  ?  Comment: Staphylococcus haemolyticus ?Based on susceptibility to oxacillin this isolate would be ?susceptible to: ?*Penicillinase-stable penicillins, such as: ?  Cloxacillin, Dicloxacillin, Nafcillin ?*Beta-lactam combination agents, such as: ?  Amoxicillin-clavulanic acid, Ampicillin-sulbactam, ?  Piperacillin-tazobactam ?*Oral cephems, such as: ?  Cefaclor, Cefdinir, Cefpodoxime, Cefprozil, Cefuroxime, ?  Cephalexin, Loracarbef ?*Parenteral cephems, such as: ?  Cefazolin, Cefepime, Cefotaxime, Cefotetan, Ceftaroline, ?  Ceftizoxime, Ceftriaxone, Cefuroxime ?*Carbapenems, such as: ?  Doripenem, Ertapenem, Imipenem, Meropenem ?Most isolates of Staphylococcus sp. produce a beta-lactamase enzyme ?rendering them resistant to penicillin. Please contact the laboratory ?if penicillin is being considered for therapy. ?3,000 Colonies/mL ?  ? ANTIMICROBIAL SUSCEPTIBILITY Comment  Final  ?  Comment:       ** S = Susceptible; I = Intermediate; R = Resistant ** ?  P = Positive; N = Negative ?            MICS are expressed in micrograms per mL ?   Antibiotic                 RSLT#1    RSLT#2    RSLT#3    RSLT#4 ?Ciprofloxacin                  S         S ?Gentamicin                     S         S ?Levofloxacin                   S         S ?Linezolid                      S         S ?Moxifloxacin                   S         S ?Nitrofurantoin                 S         S ?Oxacillin                      R         S ?Penicillin                     R ?Quinupristin/Dalfopristin      S ?Rifampin                       S         S ?Tetracycline                   S         S ?Trimethoprim/Sulfa             S         S ?Vancomycin                     S         S ?  ?Microscopic Examination     Status: Abnormal  ? Collection Time: 07/05/21 10:36 AM  ? Urine  ?Result Value Ref Range Status  ?  WBC, UA 0-5 0 - 5 /hpf Final  ? RBC 11-30 (A) 0 - 2 /hpf Final  ? Epithelial Cells (non renal) 0-10 0 - 10 /hpf Final  ? Bacteria, UA Few None seen/Few Final  ?Resp Panel by RT-PCR (Flu A&B, Covid) Nasopharyngeal Swab

## 2021-07-12 NOTE — Consult Note (Signed)
? ?                                                                                ?Consultation Note ?Date: 07/12/2021  ? ?Patient Name: Scott Guzman  ?DOB: [birth year]  MRN: [id number]  Age / Sex: 81 y.o., male  ?PCP: [name], MD ?Referring Physician: Barb Merino, MD ? ?Reason for Consultation: Establishing goals of care ? ?HPI/Patient Profile: 81 y.o. male  with past medical history of CAD, A-fib on amiodarone and Xarelto, dementia, HTN/HLD, temporal arteritis, dysphagia, colon cancer with partial resection in 2000, kyphoplasty 2018, dyspnea, dysphagia admitted on 07/05/2021 with acute metabolic encephalopathy in a patient with baseline dementia, hydronephrosis with urinary obstruction due to ureteral calculus on the left with bilateral stenting.  ? ?Clinical Assessment and Goals of Care: ?I have reviewed medical records including EPIC notes, labs and imaging, received report from RN, assessed the patient.  Mr. Upperman is lying quietly in bed.  He appears acutely/chronically ill and quite frail.  He is resting comfortably, will briefly open his eyes making but not keeping eye contact when stimulated.  I do not believe that he can make his basic needs known.  His son, Shanon Brow, is at bedside. ? ?We meet to discuss diagnosis prognosis, GOC, EOL wishes, disposition and options.  I introduced Palliative Medicine as specialized medical care for people living with serious illness. It focuses on providing relief from the symptoms and stress of a serious illness. The goal is to improve quality of life for both the patient and the family. ? ?We discussed a brief life review of the patient.  Mr. and Mrs. Caligiuri have been married 55+ years.  He worked as an Art gallery manager.  Mr. Alfonse Spruce also participated in senior games track and field. ? ?We then focused on their current illness.  We talk about Mr. Lhommedieu's acute health concerns including, but not limited to, kidney stones, cystoscopy, bilateral stenting.  We talk about  short-term rehab and the likelihood that Mr. Cloe will need long-term care.  We talk about what is normal and expected for progression of illness.  We talk about what is normal and expected with the progression of memory loss.  Mr. Tavano has been experiencing memory loss for about 6 years.  I share that it remains to be seen Mr. Girdler's ability to recover.  We talk about the normal risks for people with memory loss changing their scenery.  I shared that just like I will get sick again, so will [name]. ? ?We talked about how to make choices for loved ones including 1) keeping them at the center of decision-making to) are we doing something for them or to them 3) the person Mr. Mariane Masters was 10 years ago, how would that man tell family to care for him now.  The natural disease trajectory and expectations at EOL were discussed.  Advanced directives, concepts specific to code status, artifical feeding and hydration, and rehospitalization were considered and discussed.  We talk about the concept of "treat the treatable, but allowing natural passing".  We also talk about the concept of "let nature take its course". ? ?Palliative Care services outpatient were explained  and offered for further goals of care and CODE STATUS discussions.  Shanon Brow shares that he will share these discussions with his mother who is Mr. Alfonse Spruce surrogate Media planner. ? ?Discussed the importance of continued conversation with family and the medical providers regarding overall plan of care and treatment options, ensuring decisions are within the context of the patient?s values and GOCs.  Questions and concerns were addressed.  The family was encouraged to call with questions or concerns.  PMT will continue to support holistically. ? ? ?HCPOA   ?NEXT OF KIN -wife of 55+ years, [name], is Ambulance person.  Mr. Batdorf has 2 sons, Shanon Brow and Lanny Hurst who lives in [place]. ?  ? ?SUMMARY OF RECOMMENDATIONS   ?At this point continue to treat the  treatable ?Short-term rehab, anticipate long-term care at [facility] ?Time for outcomes ?Would benefit from outpatient palliative services for further goals of care/CODE STATUS discussions ? ? ?Code Status/Advance Care Planning: ?Full code -we talked about the concept of "treat the treatable, but allowing natural passing". ? ?Symptom Management:  ?Per hospitalist, no additional needs at this time. ? ?Palliative Prophylaxis:  ?Frequent Pain Assessment, Oral Care, and Turn Reposition ? ?Additional Recommendations (Limitations, Scope, Preferences): ?Full Scope Treatment ? ?Psycho-social/Spiritual:  ?Desire for further Chaplaincy support:no ?Additional Recommendations: Caregiving  Support/Resources and Education on Hospice ? ?Prognosis:  ?< 6 months, would not be surprising based on chronic illness burden, decreasing functional status.  ? ?Discharge Planning:  STR/LTC at [facility]   ? ?  ? ?Primary Diagnoses: ?Present on Admission: ? Atrial fibrillation with controlled ventricular response ([place]) ? Anemia ? ? ?I have reviewed the medical record, interviewed the patient and family, and examined the patient. The following aspects are pertinent. ? ?Past Medical History:  ?Diagnosis Date  ? Colon cancer ([place]) 2000  ? He states he had a partial colon resection.   ? Coronary artery disease   ? Dysphagia   ? Dyspnea   ? Dysrhythmia   ? Atrial Fibrillation  ? Hyperlipidemia   ? Hypertension   ? PVC (premature ventricular contraction)   ? Temporal arteritis ([place])   ? Thyroid disease   ? ?Social History  ? ?Socioeconomic History  ? Marital status: Married  ?  Spouse name: [name]   ? Number of children: 2  ? Years of education: Not on file  ? Highest education level: Bachelor's degree (e.g., BA, AB, BS)  ?Occupational History  ? Occupation: retired  ?  Comment: [employer]  ? 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 Smoking status: Never  ? Smokeless tobacco: Never  ?Vaping Use  ?  Vaping Use: Never used  ?Substance and Sexual Activity  ? Alcohol use: Yes  ?  Alcohol/week: 4.0 standard drinks  ?  Types: 4 Cans of beer per week  ? Drug use: No  ? Sexual activity: Not on file  ?Other Topics Concern  ? Not on file  ?Social History Narrative  ? 1 cup coffee per day  ? Right handed  ? ?Social Determinants of Health  ? ?Financial Resource Strain: Low Risk   ? Difficulty of Paying Living Expenses: Not hard at all  ?Food Insecurity: No Food Insecurity  ? Worried About Charity fundraiser in the Last Year: Never true  ? Ran Out of Food in the Last Year: Never true  ?Transportation Needs: No Transportation Needs  ? Lack of Transportation (Medical): No  ? Lack of Transportation (Non-Medical):  No  ?Physical Activity: Inactive  ? Days of Exercise per Week: 0 days  ? Minutes of Exercise per Session: 0 min  ?Stress: No Stress Concern Present  ? Feeling of Stress : Not at all  ?Social Connections: Socially Integrated  ? Frequency of Communication with Friends and Family: More than three times a week  ? Frequency of Social Gatherings with Friends and Family: Three times a week  ? Attends Religious Services: More than 4 times per year  ? Active Member of Clubs or Organizations: Yes  ? Attends Archivist Meetings: More than 4 times per year  ? Marital Status: Married  ? ?Family History  ?Problem Relation Age of Onset  ? Colon cancer Brother   ? ?Scheduled Meds: ? hydrALAZINE  50 mg Oral Q8H  ? mouth rinse  15 mL Mouth Rinse BID  ? metoprolol succinate  50 mg Oral Daily  ? mirtazapine  30 mg Oral QHS  ? pantoprazole  40 mg Oral Daily  ? pyridostigmine  60 mg Oral TID  ? QUEtiapine  25 mg Oral BID  ? tamsulosin  0.4 mg Oral Daily  ? ?Continuous Infusions: ? sodium chloride 100 mL/hr at 07/12/21 1430  ? ?PRN Meds:.acetaminophen **OR** acetaminophen, haloperidol, hydrALAZINE, nitroGLYCERIN, ondansetron **OR** ondansetron (ZOFRAN) IV ?Medications Prior to Admission:  ?Prior to Admission medications    ?Medication Sig Start Date End Date Taking? Authorizing Provider  ?acetaminophen (TYLENOL) 500 MG tablet Take 1,000 mg by mouth every 8 (eight) hours as needed for moderate pain.   Yes [provider]

## 2021-07-12 NOTE — Progress Notes (Addendum)
Physical Therapy Treatment ?Patient Details ?Name: Scott Guzman ?MRN: [id number] ?DOB: 11-26-1940 ?Today's Date: 07/12/2021 ? ? ?History of Present Illness Pt is a 81 y/o M who was seen in the ED on 9/32/67 for renal colic, was treated & discharged. Pt returned to the ED on 07/05/21 for evaluation of confusion & 2 falls. CT revealed 5 mm obstructive ureteral calculus on the left with hydroureteronephrosis. Pt is s/p cystoscopy & left ureteral stent placement on 07/06/21. PMH: CAD, a-fib on amiodarone & xarelto, dementia, HTN, colon CA, dysphagia, HLD, HTN, PVC, thyroid disease ? ?  ?PT Comments  ? ? Pt Received in bed with Son by his side. Pt receiving IV and has mittens on due to pt tries to pull the IV off. Pt very sleepy. Pt sat on the EOB for 25mns with mod to max assist and mod assist to maintain sitting balance. Pt needed Max VCs to open eyes. Pt continued to mumble. Pt's family discussed D/C plan with PT extensively to understand the process at length. Pt will benefit from SNF to return to PLOF.  ?  ?Recommendations for follow up therapy are one component of a multi-disciplinary discharge planning process, led by the attending physician.  Recommendations may be updated based on patient status, additional functional criteria and insurance authorization. ? ?Follow Up Recommendations ? Skilled nursing-short term rehab (<3 hours/day) ?  ?  ?Assistance Recommended at Discharge Frequent or constant Supervision/Assistance  ?Patient can return home with the following Two people to help with walking and/or transfers;Assistance with feeding;Two people to help with bathing/dressing/bathroom;Assistance with cooking/housework;Direct supervision/assist for financial management;Assist for transportation;Direct supervision/assist for medications management;Help with stairs or ramp for entrance ?  ?Equipment Recommendations ? None recommended by PT ?  ?  ?Recommendations for Other Services   ? ? ?  ?Precautions / Restrictions  Precautions ?Precautions: Fall ?Restrictions ?Weight Bearing Restrictions: No  ?  ? ?Mobility ? Bed Mobility ?Overal bed mobility: Needs Assistance ?Bed Mobility: Supine to Sit, Sit to Supine ?  ?  ?Supine to sit: Max assist ?Sit to supine: Max assist ?  ?  ?  ? ?Transfers ?  ?  ?  ?  ?  ?  ?  ?  ?  ?  ?  ? ?Ambulation/Gait ?  ?  ?  ?  ?  ?  ?  ?  ? ? ?Stairs ?  ?  ?  ?  ?  ? ? ?Wheelchair Mobility ?  ? ?Modified Rankin (Stroke Patients Only) ?  ? ? ?  ?Balance Overall balance assessment: Needs assistance ?Sitting-balance support: Bilateral upper extremity supported, Feet supported ?Sitting balance-Leahy Scale: Fair ?Sitting balance - Comments: min to mod  assist static sitting EOB ?Postural control: Posterior lean, Right lateral lean ?  ?  ?  ?  ?  ?  ?  ?  ?  ?  ?  ?  ?  ?  ?  ? ?  ?Cognition Arousal/Alertness: Suspect due to medications ?Behavior During Therapy: Flat affect ?Overall Cognitive Status: History of cognitive impairments - at baseline ?Area of Impairment: Orientation, Attention, Following commands, Safety/judgement, Memory, Awareness, Problem solving ?  ?  ?  ?  ?  ?  ?  ?  ?Orientation Level: Disoriented to, Place, Time, Situation ?Current Attention Level: Selective ?Memory: Decreased short-term memory ?Following Commands: Follows one step commands consistently ?Safety/Judgement: Decreased awareness of safety, Decreased awareness of deficits ?Awareness: Intellectual ?Problem Solving: Slow processing, Decreased initiation, Difficulty sequencing, Requires verbal cues, Requires  tactile cues ?General Comments: Pt continues to perform below PLOF in terms of cognitive function; pt stating "where is my asparagus" ?  ?  ? ?  ?Exercises   ? ?  ?General Comments   ?  ?  ? ?Pertinent Vitals/Pain    ? ? ?Home Living   ?  ?  ?  ?  ?  ?  ?  ?  ?  ?   ?  ?Prior Function    ?  ?  ?   ? ?PT Goals (current goals can now be found in the care plan section) Acute Rehab PT Goals ?PT Goal Formulation: With family ?Time For  Goal Achievement: 07/21/21 ?Potential to Achieve Goals: Fair ? ?  ?Frequency ? ? ? Min 2X/week ? ? ? ?  ?PT Plan Current plan remains appropriate  ? ? ?Co-evaluation   ?  ?  ?  ?  ? ?  ?AM-PAC PT "6 Clicks" Mobility   ?Outcome Measure ? Help needed turning from your back to your side while in a flat bed without using bedrails?: A Lot ?Help needed moving from lying on your back to sitting on the side of a flat bed without using bedrails?: A Lot ?Help needed moving to and from a bed to a chair (including a wheelchair)?: A Lot ?Help needed standing up from a chair using your arms (e.g., wheelchair or bedside chair)?: A Lot ?Help needed to walk in hospital room?: A Lot ?Help needed climbing 3-5 steps with a railing? : Total ?6 Click Score: 11 ? ?  ?End of Session   ?  ?Patient left: in bed;with family/visitor present ?Nurse Communication: Mobility status ?PT Visit Diagnosis: Unsteadiness on feet (R26.81);Muscle weakness (generalized) (M62.81);Difficulty in walking, not elsewhere classified (R26.2) ?  ? ? ?Time: 9518-8416 ?PT Time Calculation (min) (ACUTE ONLY): 23 min ? ?Charges:  $Therapeutic Activity: 23-37 mins          ?Joaquin Music PT DPT ?4:57 PM,07/12/21 .   ?Joaquin Music PT DPT ?5:10 PM,07/12/21       ? ?

## 2021-07-12 NOTE — Care Management Important Message (Signed)
Important Message ? ?Patient Details  ?Name: Scott Guzman ?MRN: [id number] ?Date of Birth: 1940/06/21 ? ? ?Medicare Important Message Given:  Yes ? ? ? ? ?Dannette Barbara ?07/12/2021, 2:48 PM ?

## 2021-07-12 NOTE — Progress Notes (Signed)
Surgical Physician Order Form [facility] ? ?Dr. Bernardo Heater * Scheduling expectation :  07/24/2021 ? ?*Length of Case:  ? ?*Clearance needed: no ? ?*Anticoagulation Instructions: N/A ? ?*Aspirin Instructions: N/A ? ?*Post-op visit Date/Instructions:  1 month follow up ? ?*Diagnosis:  Bilateral ureteral calculi ? ?*Procedure: bilateral  ureteral stent removal under anesthesia ? ? ?Additional orders: N/A ? ?-Admit type: OUTpatient ? ?-Anesthesia: General ? ?-VTE Prophylaxis Standing Order SCD?s    ?   ?Other:  ? ?-Standing Lab Orders Per Anesthesia   ? ?Lab other: UA&Urine Culture ? ?-Standing Test orders EKG/Chest x-ray per Anesthesia      ? ?Test other:  ? ?- Medications:  Ancef 1gm IV ? ?-Other orders:  N/A ? ? ? ?   ?

## 2021-07-12 NOTE — TOC Progression Note (Signed)
Transition of Care (TOC) - Progression Note  ? ? ?Patient Details  ?Name: Scott Guzman ?MRN: [id number] ?Date of Birth: 1940/08/31 ? ?Transition of Care (TOC) CM/SW Contact  ?Pete Pelt, RN ?Phone Number: ?07/12/2021, 11:44 AM ? ?Clinical Narrative:   Spouse and son chose [facility].  [facility] aware, auth in progress. ? ? ? ?Expected Discharge Plan: [place] ?Barriers to Discharge: Continued Medical Work up ? ?Expected Discharge Plan and Services ?Expected Discharge Plan: [place] ?  ?Discharge Planning Services: CM Consult ?Post Acute Care Choice: [place] ?Living arrangements for the past 2 months: [place] ?                ?  ?  ?  ?  ?  ?  ?  ?  ?  ?  ? ? ?Social Determinants of Health (SDOH) Interventions ?  ? ?Readmission Risk Interventions ?   ? View : No data to display.  ?  ?  ?  ? ? ?

## 2021-07-13 ENCOUNTER — Encounter: Payer: Medicare Other | Admitting: Urology

## 2021-07-13 ENCOUNTER — Telehealth: Payer: Self-pay

## 2021-07-13 DIAGNOSIS — N132 Hydronephrosis with renal and ureteral calculous obstruction: Secondary | ICD-10-CM | POA: Diagnosis not present

## 2021-07-13 DIAGNOSIS — F03C0 Unspecified dementia, severe, without behavioral disturbance, psychotic disturbance, mood disturbance, and anxiety: Secondary | ICD-10-CM

## 2021-07-13 DIAGNOSIS — N179 Acute kidney failure, unspecified: Secondary | ICD-10-CM | POA: Diagnosis not present

## 2021-07-13 DIAGNOSIS — G9341 Metabolic encephalopathy: Secondary | ICD-10-CM | POA: Diagnosis not present

## 2021-07-13 DIAGNOSIS — R627 Adult failure to thrive: Secondary | ICD-10-CM | POA: Diagnosis not present

## 2021-07-13 DIAGNOSIS — R2681 Unsteadiness on feet: Secondary | ICD-10-CM | POA: Diagnosis not present

## 2021-07-13 DIAGNOSIS — M6281 Muscle weakness (generalized): Secondary | ICD-10-CM | POA: Diagnosis not present

## 2021-07-13 DIAGNOSIS — R6889 Other general symptoms and signs: Secondary | ICD-10-CM | POA: Diagnosis not present

## 2021-07-13 DIAGNOSIS — I4891 Unspecified atrial fibrillation: Secondary | ICD-10-CM | POA: Diagnosis not present

## 2021-07-13 DIAGNOSIS — Z7189 Other specified counseling: Secondary | ICD-10-CM | POA: Diagnosis not present

## 2021-07-13 DIAGNOSIS — I2510 Atherosclerotic heart disease of native coronary artery without angina pectoris: Secondary | ICD-10-CM | POA: Diagnosis not present

## 2021-07-13 DIAGNOSIS — N139 Obstructive and reflux uropathy, unspecified: Secondary | ICD-10-CM | POA: Diagnosis not present

## 2021-07-13 DIAGNOSIS — M6259 Muscle wasting and atrophy, not elsewhere classified, multiple sites: Secondary | ICD-10-CM | POA: Diagnosis not present

## 2021-07-13 DIAGNOSIS — N131 Hydronephrosis with ureteral stricture, not elsewhere classified: Secondary | ICD-10-CM | POA: Diagnosis not present

## 2021-07-13 DIAGNOSIS — R1311 Dysphagia, oral phase: Secondary | ICD-10-CM | POA: Diagnosis not present

## 2021-07-13 DIAGNOSIS — Z743 Need for continuous supervision: Secondary | ICD-10-CM | POA: Diagnosis not present

## 2021-07-13 DIAGNOSIS — R278 Other lack of coordination: Secondary | ICD-10-CM | POA: Diagnosis not present

## 2021-07-13 DIAGNOSIS — Z515 Encounter for palliative care: Secondary | ICD-10-CM | POA: Diagnosis not present

## 2021-07-13 DIAGNOSIS — I10 Essential (primary) hypertension: Secondary | ICD-10-CM | POA: Diagnosis not present

## 2021-07-13 DIAGNOSIS — Z7401 Bed confinement status: Secondary | ICD-10-CM | POA: Diagnosis not present

## 2021-07-13 DIAGNOSIS — G7000 Myasthenia gravis without (acute) exacerbation: Secondary | ICD-10-CM | POA: Diagnosis not present

## 2021-07-13 NOTE — Discharge Summary (Signed)
Physician Discharge Summary  ?Scott Guzman FBP:[id number] DOB: 1941-02-12 DOA: 07/05/2021 ? ?PCP: [name], MD ? ?Admit date: 07/05/2021 ?Discharge date: 07/13/2021 ? ?Admitted From: Home ?Disposition: Skilled nursing facility ? ?Recommendations for Outpatient Follow-up:  ?Follow up with PCP in 1-2 weeks ?Please obtain BMP/CBC in one week ?Urology will schedule follow-up ?Consult palliative care at skilled nursing facility for outpatient follow-up. ? ?Home Health: N/A ?Equipment/Devices: N/A ? ?Discharge Condition: Stable ?CODE STATUS: DNR ?Diet recommendation: Low-salt diet.  Nutritional supplements. ? ?Discharge summary: ?Brief Narrative:  ? ?81 year old with history of coronary artery disease, A-fib on Xarelto, dementia, hypertension admitted with renal colic, confusion and fall at home witnessed by his wife.  He was seen 2 days ago in the emergency room by urology and discharged.  He was found with AKI, 5 mm obstructive ureteral calculus on the left with hydroureteronephrosis.  Treated with cystoscopy and stenting.  Remained  in the hospital with confusion, need for rehab with slow improvement. ?  ?  ?Assessment & plan of care: ?  ?Acute metabolic encephalopathy in a patient with baseline dementia, multiple medical issues. ?Delirium precautions.  Fall precautions. ?Patient is started on Seroquel 25 mg twice daily.  Using haloperidol 2 mg every 6 hours for agitation delirium, however he has not needed anything for last 24 hours.  Frequent reorientation. ?His symptoms are fairly controlled today and able to transfer to SNF for rehab. ?With worsening dementia, encouraged to keep up appointment with neurologist.  Ongoing palliative follow-up will be beneficial.  May even benefit with hospice in near future. ?  ?Hydronephrosis with urinary obstruction due to ureteral calculus on the left: ?Status post bilateral stent placement by urology ?Continue Flomax, successful voiding trial.  Urology will schedule follow-up.   Received antibiotics and completed.  Urine culture with no significant growth. ?He is scheduled for stent removal on 5/2 with urology.  Keep off Xarelto until then. ?  ?Acute kidney injury: Improved.  Encourage oral fluid intake. ?  ?Essential hypertension, elevated blood pressures: Not on treatment at home.  Significantly elevated.  Started on hydralazine and Toprol with improvement.  Will prescribe on discharge. ?  ?Chronic anticoagulation on Xarelto: Held due to hematuria.  Patient with clear urine now.  High risk of bleeding.  ?We will continue to hold Xarelto until urology follow-up for cystoscopy in 10 days.  ?  ?Atrial fibrillation: Not on rate control medications at home.  Started on Toprol.  Continue.  Holding Xarelto for now. ?  ?Goal of care: ?Goal of care discussion at bedside 4/21.  Wife and son present.  Progressively worsening dementia. ?DNR/DNI. ?Outpatient palliative care follow-up.  Possible hospice follow-up in near future. ? ?Medically stable to transfer to a skilled level of care. ? ? ?Discharge Diagnoses:  ?Principal Problem: ?  Acute metabolic encephalopathy ?Active Problems: ?  AKI (acute kidney injury) ([place]) ?  Hydronephrosis with urinary obstruction due to ureteral calculus, left ?  Hypertensive urgency ?  Anemia ?  Falls, initial encounter ?  Chronic anticoagulation ?  Atrial fibrillation with controlled ventricular response ([name]) ?  Dementia ([name]) ?  Hematuria ?  Ureteral stone ? ? ? ?Discharge Instructions ? ?Discharge Instructions   ? ? Call MD for:  temperature >100.4   Complete by: As directed ?  ? Diet - low sodium heart healthy   Complete by: As directed ?  ? Increase activity slowly   Complete by: As directed ?  ? No wound care   Complete by: As  directed ?  ? ?  ? ?Allergies as of 07/13/2021   ?No Known Allergies ?  ? ?  ?Medication List  ?  ? ?STOP taking these medications   ? ?naproxen 500 MG tablet ?Commonly known as: Naprosyn ?  ?Rivaroxaban 15 MG Tabs tablet ?Commonly known as:  XARELTO ?  ?traMADol 50 MG tablet ?Commonly known as: Ultram ?  ? ?  ? ?TAKE these medications   ? ?acetaminophen 500 MG tablet ?Commonly known as: TYLENOL ?Take 1,000 mg by mouth every 8 (eight) hours as needed for moderate pain. ?  ?albuterol 108 (90 Base) MCG/ACT inhaler ?Commonly known as: VENTOLIN HFA ?Inhale 2 puffs into the lungs every 6 (six) hours as needed for wheezing or shortness of breath. ?  ?docusate sodium 50 MG capsule ?Commonly known as: COLACE ?Take 1 capsule (50 mg total) by mouth daily as needed for mild constipation. ?  ?haloperidol 2 MG tablet ?Commonly known as: HALDOL ?Take 1 tablet (2 mg total) by mouth every 6 (six) hours as needed for up to 5 days for agitation. ?  ?hydrALAZINE 50 MG tablet ?Commonly known as: APRESOLINE ?Take 1 tablet (50 mg total) by mouth every 8 (eight) hours. ?  ?metoprolol succinate 50 MG 24 hr tablet ?Commonly known as: TOPROL-XL ?Take 1 tablet (50 mg total) by mouth daily. Take with or immediately following a meal. ?  ?mirtazapine 30 MG tablet ?Commonly known as: REMERON ?Take 1 tablet (30 mg total) by mouth at bedtime for 5 days. Please schedule office visit before any future refills. ?  ?nitroGLYCERIN 0.4 MG SL tablet ?Commonly known as: Nitrostat ?Place 1 tablet (0.4 mg total) under the tongue every 5 (five) minutes x 3 doses as needed for chest pain. ?  ?ondansetron 4 MG disintegrating tablet ?Commonly known as: ZOFRAN-ODT ?Take 1 tablet (4 mg total) by mouth every 8 (eight) hours as needed for nausea or vomiting. ?  ?pantoprazole 40 MG tablet ?Commonly known as: Protonix ?Take 1 tablet (40 mg total) by mouth daily. ?  ?pyridostigmine 60 MG tablet ?Commonly known as: MESTINON ?TAKE 1/2 TO 1 TABLET BY MOUTH 3 TIMES A DAY. Call [phone] to schedule follow up for future refills ?  ?QUEtiapine 25 MG tablet ?Commonly known as: SEROQUEL ?Take 1 tablet (25 mg total) by mouth 2 (two) times daily. ?  ?tamsulosin 0.4 MG Caps capsule ?Commonly known as: Flomax ?Take  1 capsule (0.4 mg total) by mouth daily. ?  ? ?  ? ? Contact information for after-discharge care   ? ? Destination   ? ? [facility] Preferred [facility] .   ?Service: Skilled Nursing ?Contact information: ?[address] ?[place] ?[phone] ? ?  ?  ? ?  ?  ? ?  ?  ? ?  ? ?No Known Allergies ? ?Consultations: ?Urology ?Palliative ? ? ?Procedures/Studies: ?DG Chest 2 View ? ?Result Date: 06/21/2021 ?CLINICAL DATA:  Chest pain, shortness of breath EXAM: CHEST - 2 VIEW COMPARISON:  01/14/2021 FINDINGS: Cardiac size is within normal limits. Increase in AP diameter of chest suggests COPD. There are no signs of pulmonary edema or new focal infiltrates. There is no pleural effusion or pneumothorax. There is previous kyphoplasty in 1 of the lower thoracic vertebral bodies with no significant change. IMPRESSION: COPD.  There are no new infiltrates or signs of pulmonary edema. Electronically Signed   By: Elmer Picker M.D.   On: 06/21/2021 11:17  ? ?CT HEAD WO CONTRAST (5MM) ? ?  Result Date: 07/05/2021 ?CLINICAL DATA:  Status post multiple falls. EXAM: CT HEAD WITHOUT CONTRAST TECHNIQUE: Contiguous axial images were obtained from the base of the skull through the vertex without intravenous contrast. RADIATION DOSE REDUCTION: This exam was performed according to the departmental dose-optimization program which includes automated exposure control, adjustment of the mA and/or kV according to patient size and/or use of iterative reconstruction technique. COMPARISON:  December 28, 2020 FINDINGS: Brain: There is mild cerebral atrophy with widening of the extra-axial spaces and ventricular dilatation. There are areas of decreased attenuation within the white matter tracts of the supratentorial brain, consistent with microvascular disease changes. Vascular: No hyperdense vessel or unexpected calcification. Skull: Normal. Negative for fracture or focal lesion. Sinuses/Orbits: There is mild  right ethmoid sinus mucosal thickening. A 12 mm x 13 mm right maxillary sinus polyp versus mucous retention cyst is also seen. Other: None. IMPRESSION: 1. No acute intracranial abnormality. 2. Mild right

## 2021-07-13 NOTE — TOC Progression Note (Signed)
Transition of Care (TOC) - Progression Note  ? ? ?Patient Details  ?Name: Scott Guzman ?MRN: [id number] ?Date of Birth: 1940/07/10 ? ?Transition of Care (TOC) CM/SW Contact  ?Pete Pelt, RN ?Phone Number: ?07/13/2021, 11:42 AM ? ?Clinical Narrative:   Patient will discharge to [facility] today, okay per Tammy at facility.  [facility] to transport, family aware. ? ? ? ?Expected Discharge Plan: [place] ?Barriers to Discharge: Continued Medical Work up ? ?Expected Discharge Plan and Services ?Expected Discharge Plan: [place] ?  ?Discharge Planning Services: CM Consult ?Post Acute Care Choice: [place] ?Living arrangements for the past 2 months: [place] ?Expected Discharge Date: 07/13/21               ?  ?  ?  ?  ?  ?  ?  ?  ?  ?  ? ? ?Social Determinants of Health (SDOH) Interventions ?  ? ?Readmission Risk Interventions ?   ? View : No data to display.  ?  ?  ?  ? ? ?

## 2021-07-13 NOTE — Progress Notes (Signed)
Physical Therapy Treatment ?Patient Details ?Name: Scott Guzman ?MRN: [id number] ?DOB: 03-Dec-1940 ?Today's Date: 07/13/2021 ? ? ?History of Present Illness Pt is a 81 y/o M who was seen in the ED on 04/23/84 for renal colic, was treated & discharged. Pt returned to the ED on 07/05/21 for evaluation of confusion & 2 falls. CT revealed 5 mm obstructive ureteral calculus on the left with hydroureteronephrosis. Pt is s/p cystoscopy & left ureteral stent placement on 07/06/21. PMH: CAD, a-fib on amiodarone & xarelto, dementia, HTN, colon CA, dysphagia, HLD, HTN, PVC, thyroid disease ? ?  ?PT Comments  ? ? Pt seen for PT tx with pt received in bed, alert & engaging with PT but pt with ongoing impaired cognition as he's not able to initiate supine<>sit and continues to ask for his glasses despite PT placing them on his face at beginning of session. Pt requires max assist for bed mobility & mod assist for STS. Pt is able to ambulate into hallway with 1UE HHA mod assist with significantly impaired gait pattern & balance as noted below. Pt remains a high fall risk during all mobility tasks but is making good functional improvements. Continue to recommend STR upon d/c. ?   ?Recommendations for follow up therapy are one component of a multi-disciplinary discharge planning process, led by the attending physician.  Recommendations may be updated based on patient status, additional functional criteria and insurance authorization. ? ?Follow Up Recommendations ? Skilled nursing-short term rehab (<3 hours/day) ?  ?  ?Assistance Recommended at Discharge Frequent or constant Supervision/Assistance  ?Patient can return home with the following A lot of help with walking and/or transfers;Assistance with feeding;A lot of help with bathing/dressing/bathroom;Assist for transportation;Assistance with cooking/housework;Direct supervision/assist for financial management;Help with stairs or ramp for entrance;Direct supervision/assist for medications  management ?  ?Equipment Recommendations ? None recommended by PT  ?  ?Recommendations for Other Services   ? ? ?  ?Precautions / Restrictions Precautions ?Precautions: Fall ?Restrictions ?Weight Bearing Restrictions: No  ?  ? ?Mobility ? Bed Mobility ?Overal bed mobility: Needs Assistance ?Bed Mobility: Supine to Sit, Sit to Supine ?  ?  ?Supine to sit: Max assist, HOB elevated ?Sit to supine: Max assist, HOB elevated ?  ?General bed mobility comments: Unable to initiate movement, requires max assist ?  ? ?Transfers ?Overall transfer level: Needs assistance ?Equipment used: 1 person hand held assist ?Transfers: Sit to/from Stand ?Sit to Stand: Mod assist ?  ?  ?  ?  ?  ?  ?  ? ?Ambulation/Gait ?Ambulation/Gait assistance: Mod assist ?Gait Distance (Feet): 30 Feet ?Assistive device: 1 person hand held assist ?Gait Pattern/deviations: Decreased step length - right, Decreased step length - left, Decreased dorsiflexion - right, Decreased dorsiflexion - left, Decreased stride length, Shuffle, Trunk flexed ?Gait velocity: decreased ?  ?  ?General Gait Details: BLE hips/knees flexed during mobility, inconsistent step length/width BLE ? ? ?Stairs ?  ?  ?  ?  ?  ? ? ?Wheelchair Mobility ?  ? ?Modified Rankin (Stroke Patients Only) ?  ? ? ?  ?Balance Overall balance assessment: Needs assistance ?Sitting-balance support: Bilateral upper extremity supported, Feet supported ?Sitting balance-Leahy Scale: Fair ?Sitting balance - Comments: supervision static sitting EOB ?  ?Standing balance support: Single extremity supported, During functional activity ?Standing balance-Leahy Scale: Poor ?  ?  ?  ?  ?  ?  ?  ?  ?  ?  ?  ?  ?  ? ?  ?Cognition Arousal/Alertness: Awake/alert ?  Behavior During Therapy: Flat affect ?Overall Cognitive Status: History of cognitive impairments - at baseline ?Area of Impairment: Orientation, Attention, Following commands, Safety/judgement, Memory, Awareness, Problem solving ?  ?  ?  ?  ?  ?  ?  ?   ?Orientation Level: Disoriented to, Place, Time, Situation ?  ?  ?Following Commands: Follows one step commands inconsistently, Follows one step commands with increased time ?Safety/Judgement: Decreased awareness of safety, Decreased awareness of deficits ?Awareness: Intellectual ?Problem Solving: Slow processing, Decreased initiation, Difficulty sequencing, Requires verbal cues, Requires tactile cues ?General Comments: Pt asks for glasses repeatedly despite PT donning them on pt at beginning of session. Unable to follow commands to initiate supine<>sit. ?  ?  ? ?  ?Exercises   ? ?  ?General Comments   ?  ?  ? ?Pertinent Vitals/Pain Pain Assessment ?Pain Assessment: Faces ?Faces Pain Scale: Hurts a little bit ?Pain Location: generalized during supine<>sit ?Pain Descriptors / Indicators: Grimacing, Discomfort ?Pain Intervention(s): Repositioned, Monitored during session  ? ? ?Home Living   ?  ?  ?  ?  ?  ?  ?  ?  ?  ?   ?  ?Prior Function    ?  ?  ?   ? ?PT Goals (current goals can now be found in the care plan section) Acute Rehab PT Goals ?Patient Stated Goal: get better ?PT Goal Formulation: With family ?Time For Goal Achievement: 07/21/21 ?Potential to Achieve Goals: Fair ?Progress towards PT goals: Progressing toward goals ? ?  ?Frequency ? ? ? Min 2X/week ? ? ? ?  ?PT Plan Current plan remains appropriate  ? ? ?Co-evaluation   ?  ?  ?  ?  ? ?  ?AM-PAC PT "6 Clicks" Mobility   ?Outcome Measure ? Help needed turning from your back to your side while in a flat bed without using bedrails?: A Lot ?Help needed moving from lying on your back to sitting on the side of a flat bed without using bedrails?: A Lot ?Help needed moving to and from a bed to a chair (including a wheelchair)?: A Lot ?Help needed standing up from a chair using your arms (e.g., wheelchair or bedside chair)?: A Lot ?Help needed to walk in hospital room?: A Lot ?Help needed climbing 3-5 steps with a railing? : Total ?6 Click Score: 11 ? ?  ?End of  Session   ?Activity Tolerance: Patient tolerated treatment well ?Patient left: in bed;with call bell/phone within reach;with bed alarm set ?  ?PT Visit Diagnosis: Unsteadiness on feet (R26.81);Muscle weakness (generalized) (M62.81);Difficulty in walking, not elsewhere classified (R26.2) ?  ? ? ?Time: 3419-6222 ?PT Time Calculation (min) (ACUTE ONLY): 11 min ? ?Charges:  $Therapeutic Activity: 8-22 mins          ?          ? ?Lavone Nian, PT, DPT ?07/13/21, 1:23 PM ? ? ? ?Waunita Schooner ?07/13/2021, 1:22 PM ? ?

## 2021-07-13 NOTE — Telephone Encounter (Signed)
Sam Vaillancourt PA, brought orders for this patient who is currently inpatient. Scheduled for next procedure and letter sent to his family about upcoming surgery.  ?

## 2021-07-13 NOTE — TOC Progression Note (Signed)
Transition of Care (TOC) - Progression Note  ? ? ?Patient Details  ?Name: Scott Guzman ?MRN: [id number] ?Date of Birth: [birth year] ? ?Transition of Care (TOC) CM/SW Contact  ?Pete Pelt, RN ?Phone Number: ?07/13/2021, 9:00 AM ? ?Clinical Narrative:   Approved 4/21 - 4/25, next review 4/25.  Reference ID: [id number].  Plan Auth ID: [id number]. ? ?As per Palliative care, family meeting is scheduled for today prior to discharge. ? ? ? ?Expected Discharge Plan: [place] ?Barriers to Discharge: Continued Medical Work up ? ?Expected Discharge Plan and Services ?Expected Discharge Plan: [place] ?  ?Discharge Planning Services: CM Consult ?Post Acute Care Choice: [place] ?Living arrangements for the past 2 months: [place] ?                ?  ?  ?  ?  ?  ?  ?  ?  ?  ?  ? ? ?Social Determinants of Health (SDOH) Interventions ?  ? ?Readmission Risk Interventions ?   ? View : No data to display.  ?  ?  ?  ? ? ?

## 2021-07-13 NOTE — Progress Notes (Addendum)
[facility] Posting Form  ? ?Surgery Date/Time: Date: 07/31/2021 ? ?Surgeon: Dr. John Giovanni, MD ? ?Surgery Location: Day Surgery ? ?Inpt ( No  )   Outpt (Yes)   Obs ( No  )  ? ?Diagnosis: N20.1 Bilateral Ureteral Stone ? ?-CPT: 24235 ? ?Surgery: Bilateral Ureteral Stent Removal ? ?Stop Anticoagulations: No ? ?Cardiac/Medical/Pulmonary Clearance needed: no ? ?*Orders entered into EPIC  Date: 07/13/21  ? ?*Case booked in [place]  Date: 07/13/21 ? ?*Notified pt of Surgery: Date: 07/13/21 ? ?PRE-OP UA & CX: no ? ?*Placed into Prior Authorization Work Que Date: 07/13/21 ? ? ?Assistant/laser/rep:No ? ? ? ? ? ? ? ? ? ? ? ? ? ? ? ?

## 2021-07-13 NOTE — Progress Notes (Signed)
Palliative: ?Scott Guzman is lying quietly in bed.  He appears acutely/chronically ill and quite frail.  He mostly keeps his eyes closed but will open on request.  He is able to tell me his name, but is unable to identify his wife at bedside.  I am not sure that he can make his basic needs known.  His wife Scott Guzman and son Scott Guzman are at bedside. ? ?We talk in detail about Scott Guzman's early life, their marriage and family.  Scott Guzman tells meaningful stories about his father's will and drive. ? ?We talk in detail about Scott Guzman's chronic illness burden including, but not limited to, the changes that naturally occur with memory loss.  We talk about mental status changes, physical and nutritional changes.  We talk about Scott Guzman's desire and ability to take in enough food and liquids.  We talk about artificially changing outcomes through hydration.  At this point family is agreeable to stop IV fluids, understanding that he may not take in enough nutrition and liquids on his own. ? ?We talk about CODE STATUS including "treat the treatable, but allowing natural passing".  Wife elects DNR.  We also talked about the concept of "let nature take its course".  I share some examples.  I shared that just like I will get sick again, so will Scott Guzman.  We talk about meaningful improvements. ? ?We talked about outpatient palliative services.  Family is agreeable to outpatient palliative, [facility].  We talked about the benefits of hospice care when the time is right.  We talked about residential hospice placement. ? ?We talk about short-term rehab and long-term care.  I shared that it would be expected for Scott Guzman to show confusion for the first few weeks while at rehab/long-term care. ? ?Conference with attending, bedside nursing staff, transition of care team related to patient condition, needs, goals of care, disposition. ?[place]/DNR form completed and placed on chart. ? ?Plan: Continue to treat the treatable but no CPR or  intubation.  No PEG.  Short-term rehab at [facility], anticipate need for long-term care if stabilizes. ? ?50 minutes  ?Quinn Axe, NP ?Palliative medicine team ?Team phone [phone] ?Greater than 50% of this time was spent counseling and coordinating care related to the above assessment and plan. ? ?

## 2021-07-14 DIAGNOSIS — N179 Acute kidney failure, unspecified: Secondary | ICD-10-CM | POA: Diagnosis not present

## 2021-07-14 DIAGNOSIS — I10 Essential (primary) hypertension: Secondary | ICD-10-CM | POA: Diagnosis not present

## 2021-07-14 DIAGNOSIS — N132 Hydronephrosis with renal and ureteral calculous obstruction: Secondary | ICD-10-CM | POA: Diagnosis not present

## 2021-07-14 DIAGNOSIS — G9341 Metabolic encephalopathy: Secondary | ICD-10-CM | POA: Diagnosis not present

## 2021-07-16 DIAGNOSIS — N139 Obstructive and reflux uropathy, unspecified: Secondary | ICD-10-CM | POA: Diagnosis not present

## 2021-07-16 DIAGNOSIS — I2510 Atherosclerotic heart disease of native coronary artery without angina pectoris: Secondary | ICD-10-CM | POA: Diagnosis not present

## 2021-07-16 DIAGNOSIS — G7000 Myasthenia gravis without (acute) exacerbation: Secondary | ICD-10-CM | POA: Diagnosis not present

## 2021-07-16 DIAGNOSIS — M6281 Muscle weakness (generalized): Secondary | ICD-10-CM | POA: Diagnosis not present

## 2021-07-19 ENCOUNTER — Encounter
Admit: 2021-07-19 | Discharge: 2021-07-19 | Disposition: A | Discharge status: Home / Self Care | Payer: Medicare Other | Attending: Urology | Admitting: Urology

## 2021-07-19 DIAGNOSIS — I10 Essential (primary) hypertension: Secondary | ICD-10-CM | POA: Diagnosis not present

## 2021-07-19 DIAGNOSIS — N132 Hydronephrosis with renal and ureteral calculous obstruction: Secondary | ICD-10-CM | POA: Diagnosis not present

## 2021-07-19 DIAGNOSIS — I4891 Unspecified atrial fibrillation: Secondary | ICD-10-CM | POA: Diagnosis not present

## 2021-07-19 DIAGNOSIS — N179 Acute kidney failure, unspecified: Secondary | ICD-10-CM | POA: Diagnosis not present

## 2021-07-19 HISTORY — DX: Hydronephrosis with ureteral stricture, not elsewhere classified: N13.1

## 2021-07-19 HISTORY — DX: Myasthenia gravis without (acute) exacerbation: G70.00

## 2021-07-19 HISTORY — DX: Other constipation: K59.09

## 2021-07-19 HISTORY — DX: Chronic kidney disease, stage 3 unspecified: N18.30

## 2021-07-19 HISTORY — DX: Vascular dementia, unspecified severity, without behavioral disturbance, psychotic disturbance, mood disturbance, and anxiety: F01.50

## 2021-07-19 HISTORY — DX: Gastro-esophageal reflux disease without esophagitis: K21.9

## 2021-07-19 NOTE — Patient Instructions (Addendum)
Your procedure is scheduled on: Tuesday, May 2 ?Report to the Registration Desk on the 1st floor of the [facility]. ?To find out your arrival time, please call [phone] between 1PM - 3PM on: Monday, May 1 ? ?REMEMBER: ?Instructions that are not followed completely may result in serious medical risk, up to and including death; or upon the discretion of your surgeon and anesthesiologist your surgery may need to be rescheduled. ? ?Do not eat or drink after midnight the night before surgery.  ?No gum chewing, lozengers or hard candies. ? ?TAKE THESE MEDICATIONS THE MORNING OF SURGERY WITH A SIP OF WATER: (if the medications need to be crushed; take with as little applesauce and water early in the morning prior to arrival to the hospital) ? ?Albuterol inhaler ?Hydralazine ?Metoprolol ?Pantoprazole (protonix) ?Pyridostigmine (Mestinon) ?Quetiapine (Seroquel) ?Tamsulosin (Flomax) ? ?Use inhalers on the day of surgery and bring to the hospital. ? ?Please send that days Medication Record with the patient to verify what medications the patient took on the day of surgery. ? ?One week prior to surgery: ?Stop Anti-inflammatories (NSAIDS) such as Advil, Aleve, Ibuprofen, Motrin, Naproxen, Naprosyn and Aspirin based products such as Excedrin, Goodys Powder, BC Powder. ?Stop ANY OVER THE COUNTER supplements until after surgery. ?You may however, continue to take Tylenol if needed for pain up until the day of surgery. ? ?No Alcohol for 24 hours before or after surgery. ? ?No Smoking including e-cigarettes for 24 hours prior to surgery.  ?No chewable tobacco products for at least 6 hours prior to surgery.  ?No nicotine patches on the day of surgery. ? ?On the morning of surgery brush your teeth with toothpaste and water, you may rinse your mouth with mouthwash if you wish. ?Do not swallow any toothpaste or mouthwash. ? ?Do not wear jewelry, make-up, hairpins, clips or nail polish. ? ?Do not wear lotions, powders, or perfumes.   ? ?Do not shave body from the neck down 48 hours prior to surgery just in case you cut yourself which could leave a site for infection.  ? ?Contact lenses, hearing aids and dentures may not be worn into surgery. ? ?Do not bring valuables to the hospital. [facility] is not responsible for any missing/lost belongings or valuables.  ? ?Notify your doctor if there is any change in your medical condition (cold, fever, infection). ? ?Wear comfortable clothing (specific to your surgery type) to the hospital. ? ?After surgery, you can help prevent lung complications by doing breathing exercises.  ?Take deep breaths and cough every 1-2 hours. Your doctor may order a device called an Incentive Spirometer to help you take deep breaths. ? ?If you are being discharged the day of surgery, you will not be allowed to drive home. ?You will need a responsible adult (18 years or older) to drive you home and stay with you that night.  ? ?If you are taking public transportation, you will need to have a responsible adult (18 years or older) with you. ?Please confirm with your physician that it is acceptable to use public transportation.  ? ?Please call the [facility] Dept[facility] at [phone] if you have any questions about these instructions. ? ?Surgery Visitation Policy: ? ?Patients undergoing a surgery or procedure may have two family members or support persons with them as long as the person is not COVID-19 positive or experiencing its symptoms.  ?

## 2021-07-23 DIAGNOSIS — N179 Acute kidney failure, unspecified: Secondary | ICD-10-CM | POA: Diagnosis not present

## 2021-07-23 DIAGNOSIS — I10 Essential (primary) hypertension: Secondary | ICD-10-CM | POA: Diagnosis not present

## 2021-07-23 DIAGNOSIS — I4891 Unspecified atrial fibrillation: Secondary | ICD-10-CM | POA: Diagnosis not present

## 2021-07-23 DIAGNOSIS — N132 Hydronephrosis with renal and ureteral calculous obstruction: Secondary | ICD-10-CM | POA: Diagnosis not present

## 2021-07-24 ENCOUNTER — Encounter: Admission: RE | Payer: Self-pay | Source: Ambulatory Visit

## 2021-07-24 ENCOUNTER — Non-Acute Institutional Stay: Payer: Self-pay | Admitting: Primary Care

## 2021-07-24 ENCOUNTER — Ambulatory Visit: Admission: RE | Admit: 2021-07-24 | Payer: Medicare Other | Source: Ambulatory Visit | Admitting: Urology

## 2021-07-24 VITALS — Ht 69.0 in | Wt 130.0 lb

## 2021-07-24 DIAGNOSIS — G9341 Metabolic encephalopathy: Secondary | ICD-10-CM

## 2021-07-24 DIAGNOSIS — F03C0 Unspecified dementia, severe, without behavioral disturbance, psychotic disturbance, mood disturbance, and anxiety: Secondary | ICD-10-CM

## 2021-07-24 DIAGNOSIS — Z515 Encounter for palliative care: Secondary | ICD-10-CM

## 2021-07-24 SURGERY — REMOVAL, STENT, URETER, CYSTOSCOPIC
Anesthesia: General | Laterality: Bilateral

## 2021-07-24 MED ORDER — LACTATED RINGERS IV SOLN: INTRAVENOUS | Status: DC

## 2021-07-24 MED ORDER — CEFAZOLIN SODIUM-DEXTROSE 2-4 GM/100ML-% IV SOLN: 2.0000 g | INTRAVENOUS | Status: DC

## 2021-07-24 NOTE — Progress Notes (Signed)
 Therapist, nutritional Palliative Care Consult Note Telephone: [phone]  Fax: [phone]   Date of encounter: 07/24/21 5:43 PM PATIENT NAME: Scott Guzman [address] [place] [facility] [address]   [phone] (home)  DOB: 01/03/1941 MRN: [id number] PRIMARY CARE PROVIDER:    Maryjane Public, MD,  [address] [place] [address] [phone]  REFERRING PROVIDER:   Eilleen Query, MD Eilleen Query, MD [address] [place],  [place] [address] [phone]   RESPONSIBLE PARTY:    Contact Information     Name Relation Home Work Mobile   Krishay, Faro [phone]          I met face to face with [facility] and [facility] in [facility]. Palliative Care was asked to follow this patient by consultation request of  Eilleen Query, MD  to address advance care planning and complex medical decision making. This is the initial visit.                                     ASSESSMENT AND PLAN / RECOMMENDATIONS:   Advance Care Planning/Goals of Care: Goals include to maximize quality of life and symptom management. Patient/health care surrogate gave his/her permission to discuss.Our advance care planning conversation included a discussion about:    The value and importance of advance care planning  Experiences with loved ones who have been seriously ill or have died  Exploration of personal, cultural or spiritual beliefs that might influence medical decisions - wife states they do not want to prolong him and they want him to die with dignity. Exploration of goals of care in the event of a sudden injury or illness  Identification of a healthcare agent - wife Review  advance directive document . Decision  de-escalate disease focused treatments due to poor prognosis. CODE STATUS: DNR I met with patient and his family in his nursing home room. He is clearly quite ill appearing and cachectic. Wife outlines that she wants him to not be prolonged and to die with dignity.  She states that they don't want death to happen but they also realize it is the reality and do not want him to suffer.  I  reviewed a MOST form today. The patient and family outlined their wishes for the following treatment decisions:  Cardiopulmonary Resuscitation: Do Not Attempt Resuscitation (DNR/No CPR)  Medical Interventions: Comfort Measures: Keep clean, warm, and dry. Use medication by any route, positioning, wound care, and other measures to relieve pain and suffering. Use oxygen, suction and manual treatment of airway obstruction as needed for comfort. Do not transfer to the hospital unless comfort needs cannot be met in current location.  Antibiotics: Determine use of limitation of antibiotics when infection occurs  IV Fluids: IV fluids for a defined trial period  Feeding Tube: No feeding tube    I spent 20 minutes providing this consultation. More than 50% of the time in this consultation was spent in counseling and care coordination. --------------------------------------------------------------------------------------------------------------------------  Symptom Management/Plan:  Disease Process: Patient's history reviewed he was seen at [facility] a year ago for weight loss. In April of this year several weeks ago he was worked up thoroughly. He has a history of auto immune disorders. His decline has been rapid and he was in good physical health until a year ago when he was no longer able to run and had increasing fatigue with minor  activity.  I reviewed labs and imaging done in mid April to ascertain reason for decline. Clinical picture is much worse than suggested by recent labs and imaging.  Mentation: Wife endorses delirium on dementia that  has not cleared. This was first documented in mid-april on his emergency room trip for renal stones. Since that time he has continued to decline not eating well and complaining of generalized pain. I would recommend around the clock  acetaminophen  and tramadol  for pain control  Mobility: He is undergoing rehab while at this skilled facility and so I defer to them for plan of care.  If he has met rehab goals it may be that he would be well  managed by hospice. This seems consistent with family  goals of care.    Nutrition: Albumin WNL,However on my exam today he appears to be quite debilitated, with minimal PO intake minimal interactions/cognition and symptoms such as pain and dehydration.   Follow up Palliative Care Visit: Palliative care will continue to follow for complex medical decision making, advance care planning, and clarification of goals. Return 1-2 weeks or prn.  This visit was coded based on medical decision making (MDM).  PPS: 30% weak  HOSPICE ELIGIBILITY/DIAGNOSIS: yes with concordant goals of care/ protein calorie malnutrition  Chief Complaint: debility, immobility, protein calorie malnutrition, delirium on dementia.  HISTORY OF PRESENT ILLNESS:  [name] is a 81 y.o. year old male  with debility, immobility, protein calorie malnutrition, delirium on dementia, h/o renal stone and AKI. [name] Patient seen today to review palliative care needs to include medical decision making and advance care planning as appropriate.   History obtained from review of EMR, discussion with primary team, and interview with family, facility staff/caregiver and/or Mr. Coder.  I reviewed available labs, medications, imaging, studies and related documents from the EMR.  Records reviewed and summarized above.   ROS /family   General:  ill appearing ENMT: denies dysphagia Pulmonary: denies cough, denies increased SOB Abdomen: endorses  poor appetite, denies constipation, endorses icontinence of bowel GU: denies dysuria, endorses incontinence of urine MSK:  endorses  increased weakness,  no falls reported (h/o frequent falls) Skin: denies rashes or wounds Neurological: endorses pain, PAINDAD 8/10 when positioning, denies  insomnia Psych: Endorses flat  mood Heme/lymph/immuno: denies bruises, abnormal bleeding  Physical Exam: Current and past weights:127-130 lbs, Body mass index is 19.2 kg/m. Constitutional:ill appearing  General: frail appearing, thin EYES: anicteric sclera, lids intact, no discharge  ENMT: oral mucous membranes  dry, dentition intact CV: S1S2, RRR, no LE edema Pulmonary: LCTA, no increased work of breathing, no cough,  Abdomen: intake 0-25%,  soft and non tender, no ascites GU: deferred MSK: severe  sarcopenia, functional quadriplegia,  non ambulatory Skin: warm and dry, no rashes or wounds on visible skin Neuro:  severe  generalized weakness,  severe  cognitive impairment Psych: +anxious affect, cannot interact verbally Hem/lymph/immuno: no widespread bruising CURRENT PROBLEM LIST:  Patient Active Problem List   Diagnosis Date Noted   Ureteral stone    Hematuria 07/08/2021   Acute metabolic encephalopathy 07/05/2021   Hypertensive urgency 07/05/2021   Falls, initial encounter 07/05/2021   Dementia ([name]) 07/05/2021   Chronic anticoagulation 07/05/2021   AKI (acute kidney injury) ([name]) 07/05/2021   Nephrolithiasis 07/05/2021   Hydronephrosis with urinary obstruction due to ureteral calculus, left 07/05/2021   Anemia 03/29/2021   Other fatigue 03/29/2021   Hyperthyroidism 10/27/2020   Constipation 10/27/2020   Memory loss 08/01/2020   Orthostatic hypotension 08/01/2020  Vertigo 08/01/2020   Excessive daytime sleepiness 08/01/2020   Other thrombophilia ([name]) 03/04/2020   Aortic atherosclerosis ([name]) 03/04/2020   CKD (chronic kidney disease) stage 3, GFR 30-59 ml/min ([name]) 03/03/2020   Abnormality of gait 07/16/2017   Essential hypertension 07/16/2017   Obstructive hydrocephalus ([name]) 07/16/2017   Syncope and collapse 07/16/2017   Headache 07/16/2017   Cerebral ventriculomegaly 06/11/2017   Atrial fibrillation with controlled ventricular response ([name]) 06/03/2017    History of colon cancer 02/07/2017   History of compression fracture of spine 01/13/2017   DOE (dyspnea on exertion) 08/19/2016   Temporal arteritis ([name]) 03/12/2016   BPH (benign prostatic hyperplasia) 03/12/2016   Elevated erythrocyte sedimentation rate 01/29/2016   Insomnia 10/31/2014   Hypercholesterolemia 10/13/2014   PAST MEDICAL HISTORY:  Active Ambulatory Problems    Diagnosis Date Noted   Hypercholesterolemia 10/13/2014   Insomnia 10/31/2014   Temporal arteritis ([name]) 03/12/2016   BPH (benign prostatic hyperplasia) 03/12/2016   History of compression fracture of spine 01/13/2017   Atrial fibrillation with controlled ventricular response ([name]) 06/03/2017   Cerebral ventriculomegaly 06/11/2017   Elevated erythrocyte sedimentation rate 01/29/2016   History of colon cancer 02/07/2017   DOE (dyspnea on exertion) 08/19/2016   CKD (chronic kidney disease) stage 3, GFR 30-59 ml/min ([name]) 03/03/2020   Other thrombophilia ([name]) 03/04/2020   Aortic atherosclerosis ([name]) 03/04/2020   Memory loss 08/01/2020   Orthostatic hypotension 08/01/2020   Vertigo 08/01/2020   Excessive daytime sleepiness 08/01/2020   Abnormality of gait 07/16/2017   Essential hypertension 07/16/2017   Obstructive hydrocephalus ([name]) 07/16/2017   Syncope and collapse 07/16/2017   Headache 07/16/2017   Hyperthyroidism 10/27/2020   Constipation 10/27/2020   Anemia 03/29/2021   Other fatigue 03/29/2021   Acute metabolic encephalopathy 07/05/2021   Hypertensive urgency 07/05/2021   Falls, initial encounter 07/05/2021   Dementia ([name]) 07/05/2021   Chronic anticoagulation 07/05/2021   AKI (acute kidney injury) ([name]) 07/05/2021   Nephrolithiasis 07/05/2021   Hydronephrosis with urinary obstruction due to ureteral calculus, left 07/05/2021   Hematuria 07/08/2021   Ureteral stone    Resolved Ambulatory Problems    Diagnosis Date Noted   Plantar fasciitis, right 02/03/2014   Colon cancer ([name]) 10/13/2014    Impacted cerumen 10/13/2014   Numbness and tingling of right face 04/25/2015   Palpitations 06/25/2016   Fatigue 06/25/2016   Atypical chest pain 08/19/2016   Chronic cough 01/22/2016   FUO (fever of unknown origin) 01/22/2016   Other constipation 02/06/2017   Unstable angina ([name]) 12/01/2018   Past Medical History:  Diagnosis Date   Chronic constipation    Chronic kidney disease, stage 3 ([name])    Coronary artery disease    Dysphagia    Dyspnea    Dysrhythmia    GERD (gastroesophageal reflux disease)    Hydronephrosis with ureteral stricture    Hyperlipidemia    Hypertension    Myasthenia gravis ([name])    Perihilar cholangiocarcinoma ([name]) 1995   PVC (premature ventricular contraction)    Thoracic compression fracture ([name]) 2018   Thyroid  disease    Vascular dementia ([name])    SOCIAL HX:  Social History   Tobacco Use   Smoking status: Never   Smokeless tobacco: Never  Substance Use Topics   Alcohol use: Not Currently    Alcohol/week: 4.0 standard drinks    Types: 4 Cans of beer per week   FAMILY HX:  Family History  Problem Relation Age of Onset   Colon cancer Brother  ALLERGIES: No Known Allergies   PERTINENT MEDICATIONS:  Outpatient Encounter Medications as of 07/24/2021  Medication Sig   acetaminophen  (TYLENOL ) 500 MG tablet Take 1,000 mg by mouth every 8 (eight) hours as needed for moderate pain.   albuterol  (VENTOLIN  HFA) 108 (90 Base) MCG/ACT inhaler Inhale 2 puffs into the lungs every 6 (six) hours as needed for wheezing or shortness of breath.   docusate sodium  (COLACE) 100 MG capsule Take 100 mg by mouth daily as needed.   hydrALAZINE  (APRESOLINE ) 25 MG tablet Take 25 mg by mouth 3 (three) times daily.   metoprolol  succinate (TOPROL -XL) 50 MG 24 hr tablet Take 1 tablet (50 mg total) by mouth daily. Take with or immediately following a meal.   nitroGLYCERIN  (NITROSTAT ) 0.4 MG SL tablet Place 1 tablet (0.4 mg total) under the tongue every 5 (five)  minutes x 3 doses as needed for chest pain.   ondansetron  (ZOFRAN -ODT) 4 MG disintegrating tablet Take 1 tablet (4 mg total) by mouth every 8 (eight) hours as needed for nausea or vomiting.   pantoprazole  (PROTONIX ) 40 MG tablet Take 1 tablet (40 mg total) by mouth daily.   pyridostigmine  (MESTINON ) 60 MG tablet TAKE 1/2 TO 1 TABLET BY MOUTH 3 TIMES A DAY. Call [phone] to schedule follow up for future refills (Patient taking differently: Take 60 mg by mouth in the morning, at noon, and at bedtime. 1 TABLET BY MOUTH 3 TIMES A DAY. Call [phone] to schedule follow up for future refills)   QUEtiapine  (SEROQUEL ) 25 MG tablet Take 1 tablet (25 mg total) by mouth 2 (two) times daily.   tamsulosin  (FLOMAX ) 0.4 MG CAPS capsule Take 1 capsule (0.4 mg total) by mouth daily.   No facility-administered encounter medications on file as of 07/24/2021.   Thank you for the opportunity to participate in the care of Mr. Honore.  The palliative care team will continue to follow. Please call our office at [phone] if we can be of additional assistance.   Lamarr Welby Sharps, NP , DNP, AGPCNP-BC  COVID-19 PATIENT SCREENING TOOL Asked and negative response unless otherwise noted:  Have you had symptoms of covid, tested positive or been in contact with someone with symptoms/positive test in the past 5-10 days?

## 2021-07-26 ENCOUNTER — Encounter (HOSPITAL_COMMUNITY): Payer: Self-pay | Admitting: Urgent Care

## 2021-07-26 ENCOUNTER — Inpatient Hospital Stay
Admission: RE | Admit: 2021-07-26 | Discharge: 2021-07-26 | Disposition: A | Discharge status: Home / Self Care | Payer: Medicare Other | Source: Ambulatory Visit

## 2021-07-26 ENCOUNTER — Encounter: Payer: Medicare Other | Admitting: Urology

## 2021-07-26 DIAGNOSIS — G9341 Metabolic encephalopathy: Secondary | ICD-10-CM | POA: Diagnosis not present

## 2021-07-26 DIAGNOSIS — N132 Hydronephrosis with renal and ureteral calculous obstruction: Secondary | ICD-10-CM | POA: Diagnosis not present

## 2021-07-26 DIAGNOSIS — I10 Essential (primary) hypertension: Secondary | ICD-10-CM | POA: Diagnosis not present

## 2021-07-26 NOTE — Patient Instructions (Addendum)
Your procedure is scheduled on: 07/31/21 - Tuesday ?Report to the Registration Desk on the 1st floor of the [facility]. ?To find out your arrival time, please call [phone] between 1PM - 3PM on: 08-08-2021 - Monday ?If your arrival time is 6:00 am, do not arrive prior to that time as the [facility] entrance doors do not open until 6:00 am. ? ?REMEMBER: ?Instructions that are not followed completely may result in serious medical risk, up to and including death; or upon the discretion of your surgeon and anesthesiologist your surgery may need to be rescheduled. ? ?Do not eat food or drink any fluids after midnight the night before surgery.  ?No gum chewing, lozengers or hard candies. ? ?TAKE THESE MEDICATIONS THE MORNING OF SURGERY WITH A SIP OF WATER: ? ?- hydrALAZINE (APRESOLINE) 25 MG tablet ?- metoprolol succinate (TOPROL-XL) 50 MG 24 hr tablet ?- pantoprazole (PROTONIX) 40 MG tablet, tamsulosin (FLOMAX) 0.4 MG CAPS capsule ?- QUEtiapine (SEROQUEL) 25 MG tablet ?- tamsulosin (FLOMAX) 0.4 MG CAPS capsule ?- pyridostigmine (MESTINON) 60 MG tablet ? ?Use albuterol (VENTOLIN HFA) 108 (90 Base) MCG/ACT inhaler on the day of surgery and bring to the hospital. ? ?One week prior to surgery: ?Stop Anti-inflammatories (NSAIDS) such as Advil, Aleve, Ibuprofen, Motrin, Naproxen, Naprosyn and Aspirin based products such as Excedrin, Goodys Powder, BC Powder. ? ?Stop ANY OVER THE COUNTER supplements until after surgery. ? ?You may however, continue to take Tylenol if needed for pain up until the day of surgery. ? ?No Alcohol for 24 hours before or after surgery. ? ?No Smoking including e-cigarettes for 24 hours prior to surgery.  ?No chewable tobacco products for at least 6 hours prior to surgery.  ?No nicotine patches on the day of surgery. ? ?Do not use any "recreational" drugs for at least a week prior to your surgery.  ?Please be advised that the combination of cocaine and anesthesia may have negative outcomes, up  to and including death. ?If you test positive for cocaine, your surgery will be cancelled. ? ?On the morning of surgery brush your teeth with toothpaste and water, you may rinse your mouth with mouthwash if you wish. ?Do not swallow any toothpaste or mouthwash. ?r ?Do not wear jewelry, make-up, hairpins, clips or nail polish. ? ?Do not wear lotions, powders, or perfumes.  ? ?Do not shave body from the neck down 48 hours prior to surgery just in case you cut yourself which could leave a site for infection.  ?Also, freshly shaved skin may become irritated if using the CHG soap. ? ?Contact lenses, hearing aids and dentures may not be worn into surgery. ? ?Do not bring valuables to the hospital. [facility] is not responsible for any missing/lost belongings or valuables.  ? ?Notify your doctor if there is any change in your medical condition (cold, fever, infection). ? ?Wear comfortable clothing (specific to your surgery type) to the hospital. ? ?After surgery, you can help prevent lung complications by doing breathing exercises.  ?Take deep breaths and cough every 1-2 hours. Your doctor may order a device called an Incentive Spirometer to help you take deep breaths. ?When coughing or sneezing, hold a pillow firmly against your incision with both hands. This is called ?splinting.? Doing this helps protect your incision. It also decreases belly discomfort. ? ?If you are being admitted to the hospital overnight, leave your suitcase in the car. ?After surgery it may be brought to your room. ? ?If you are being discharged the  day of surgery, you will not be allowed to drive home. ?You will need a responsible adult (18 years or older) to drive you home and stay with you that night.  ? ?If you are taking public transportation, you will need to have a responsible adult (18 years or older) with you. ?Please confirm with your physician that it is acceptable to use public transportation.  ? ?Please call the [facility]  Dept. at [phone] if you have any questions about these instructions. ? ?Surgery Visitation Policy: ? ?Patients undergoing a surgery or procedure may have two family members or support persons with them as long as the person is not COVID-19 positive or experiencing its symptoms.  ? ?Inpatient Visitation:   ? ?Visiting hours are 7 a.m. to 8 p.m. ?Up to four visitors are allowed at one time in a patient room, including children. The visitors may rotate out with other people during the day. One designated support person (adult) may remain overnight.  ?

## 2021-07-27 ENCOUNTER — Other Ambulatory Visit
Admission: RE | Admit: 2021-07-27 | Discharge: 2021-07-27 | Disposition: A | Discharge status: Home / Self Care | Payer: Medicare Other | Source: Ambulatory Visit | Attending: Urology | Admitting: Urology

## 2021-07-27 ENCOUNTER — Non-Acute Institutional Stay: Payer: Self-pay | Admitting: Primary Care

## 2021-07-27 ENCOUNTER — Telehealth: Payer: Self-pay

## 2021-07-27 DIAGNOSIS — G9341 Metabolic encephalopathy: Secondary | ICD-10-CM

## 2021-07-27 DIAGNOSIS — F03C0 Unspecified dementia, severe, without behavioral disturbance, psychotic disturbance, mood disturbance, and anxiety: Secondary | ICD-10-CM

## 2021-07-27 DIAGNOSIS — Z515 Encounter for palliative care: Secondary | ICD-10-CM

## 2021-07-27 NOTE — Telephone Encounter (Signed)
Call from pre-service center stating patient's wife states patient is not doing well and may not make it to next week for surgery. Patient currently in palliative care, per Dr. Bernardo Heater ok to cancel surgery or reschedule to a later date whatever is preferred by patient or wife.  ?Attempted to call patient's wife no answer home number was busy. Left a detailed voicemail on patient's cell number ?

## 2021-07-27 NOTE — Pre-Procedure Instructions (Signed)
Medication reviewed with no changes noted, [facility] aware to look for pre op instructions faxed today. Patient had pre admit appointment completed on 07/19/21. ?

## 2021-07-27 NOTE — Pre-Procedure Instructions (Signed)
This Probation officer received a phone call from nurse Erasmo Downer at [facility] to inform me that patient has had a decline in condition and now has a Hospice referral pending, she voices that the patients wife does not want to proceed with the surgery scheduled for 07/31/21. This Probation officer sent secure chat message to [place] at [facility] urology. ?

## 2021-07-27 NOTE — Progress Notes (Signed)
? ? ?Manufacturing engineer ?Community Palliative Care Consult Note ?Telephone: [phone]  ?Fax: [phone]  ? ? ?Date of encounter: 07/27/21 ?PATIENT NAME: Scott Guzman ?Scott Guzman   ?[phone] (home)  ?DOB: 1940/11/27 ?MRN: [id number] ?PRIMARY CARE PROVIDER:    ?[name], MD,  ?[address] [place] [address] ?[phone] ? ?REFERRING PROVIDER:   ?Dr Rica Koyanagi  ? ?RESPONSIBLE PARTY:    ?Contact Information   ? ? Name Relation Home Work Mobile  ? Scott Guzman Spouse [phone]    ? ?  ? ? ? ?I met face to face with [facility] and family in [facility]. Palliative Care was asked to follow this patient by consultation request of  Dr Rica Koyanagi to address advance care planning. ? ?                                 ASSESSMENT AND PLAN / RECOMMENDATIONS:  ? ?Advance Care Planning/Goals of Care: Goals include to maximize quality of life and symptom management. Patient/health care surrogate gave his/her permission to discuss.Our advance care planning conversation included a discussion about:    ?The value and importance of advance care planning  ?Exploration of personal, cultural or spiritual beliefs that might influence medical decisions  ?Exploration of goals of care in the event of a sudden injury or illness  ?Identification  of a healthcare agent  wife ?Review of an  advance directive document . ?Decision  de-escalate disease focused treatments due to poor prognosis. ?CODE STATUS: DNR ? ? ?I reviewed and uploaded  a MOST form today. The patient and family outlined their wishes for the following treatment decisions: ? ?Cardiopulmonary Resuscitation: Do Not Attempt Resuscitation (DNR/No CPR)  ?Medical Interventions: Comfort Measures: Keep clean, warm, and dry. Use medication by any route, positioning, wound care, and other measures to relieve pain and suffering. Use oxygen, suction and manual treatment of airway obstruction as needed for comfort. Do not transfer to the  hospital unless comfort needs cannot be met in current location.  ?Antibiotics: Determine use of limitation of antibiotics when infection occurs  ?IV Fluids: IV fluids for a defined trial period  ?Feeding Tube: No feeding tube  ? ? ? I met with patient in his nursing home room. He's clearly at end of life  PPS 10% to 20%. Still able to move some of his upper extremities and moan with discomfort. We discuss hospice services in place and what this could do for providing him a dignified death. Wife reiterates that she wants him not to suffer and be treated with dignity. He has his youngest sister here from [place] and she is in agreement with hospice services. I recommend this also for family support as wife as previously stated her two sons both live six hours away and that their family home is [place] and [place].  Patient has had a little PO for some days and continues to lose weight. He is cachectic and moribund.  ? ?Discussed case with Dr Park Breed, NP Phillis Haggis Guzman and business office RE options. Family has decided to waive the last two skilled days in difference to hospice admission today and if this is scheduled, tomorrow if not possible for today.  ? ?Follow up Palliative Care Visit: Referral to hospice ? ?I spent 60 minutes providing this consultation. More than 50% of the time in this consultation was spent in counseling and care coordination. ? ?  Thank you for the opportunity to participate in the care of Scott Guzman.  The palliative care team will continue to follow. Please call our office at [phone] if we can be of additional assistance.  ? ?Jason Coop DNP, MPH, AGPCNP-BC, ACHPN ? ? ?COVID-19 PATIENT SCREENING TOOL ?Asked and negative response unless otherwise noted:  ? ?Have you had symptoms of covid, tested positive or been in contact with someone with symptoms/positive test in the past 5-10 days?  ? ?

## 2021-07-30 ENCOUNTER — Other Ambulatory Visit: Payer: Medicare Other

## 2021-07-30 ENCOUNTER — Telehealth: Payer: Self-pay

## 2021-07-30 ENCOUNTER — Encounter: Payer: Self-pay | Admitting: Urology

## 2021-07-30 DIAGNOSIS — R0682 Tachypnea, not elsewhere classified: Secondary | ICD-10-CM | POA: Diagnosis not present

## 2021-07-30 DIAGNOSIS — G9341 Metabolic encephalopathy: Secondary | ICD-10-CM | POA: Diagnosis not present

## 2021-07-30 DIAGNOSIS — I10 Essential (primary) hypertension: Secondary | ICD-10-CM | POA: Diagnosis not present

## 2021-07-30 DIAGNOSIS — N132 Hydronephrosis with renal and ureteral calculous obstruction: Secondary | ICD-10-CM | POA: Diagnosis not present

## 2021-07-31 ENCOUNTER — Ambulatory Visit: Admission: RE | Admit: 2021-07-31 | Payer: Medicare Other | Source: Ambulatory Visit | Admitting: Urology

## 2021-07-31 ENCOUNTER — Encounter: Admission: RE | Payer: Self-pay | Source: Ambulatory Visit

## 2021-07-31 SURGERY — REMOVAL, STENT, URETER, CYSTOSCOPIC
Anesthesia: Choice | Laterality: Bilateral

## 2021-08-23 NOTE — Telephone Encounter (Signed)
Spoke with Clarene Critchley, RN with Pre-Service center who spoke with a nurse at [facility]. Reports that patients wife would like surgery scheduled for 07/31/2021 to be cancelled at this time due to patient's unlikely to survive current condition. I have placed a call to [facility] Director to confirm. I have also called patient's wife and left a message. I have cancelled the surgery for 05/09. ? ? ?

## 2021-08-23 DEATH — deceased

## 2021-08-29 ENCOUNTER — Encounter: Payer: Self-pay | Admitting: Urology

## 2021-09-04 ENCOUNTER — Other Ambulatory Visit: Payer: Self-pay | Admitting: Internal Medicine

## 2021-09-04 NOTE — Telephone Encounter (Signed)
Requested medication (s) are due for refill today - unknown  Requested medication (s) are on the active medication list -yes  Future visit scheduled -yes  Last refill: unknown  Notes to clinic: historical medication   Requested Prescriptions  Pending Prescriptions Disp Refills   mirtazapine (REMERON) 30 MG tablet [Pharmacy Med Name: MIRTAZAPINE 30 MG TABLET] 90 tablet 4    Sig: TAKE 1 TABLET (30 MG TOTAL) BY MOUTH AT BEDTIME. PLEASE SCHEDULE OFFICE VISIT BEFORE ANY FUTURE REFILLS.     Psychiatry: Antidepressants - mirtazapine Passed - 09/04/2021  1:53 AM      Passed - Valid encounter within last 6 months    Recent Outpatient Visits           2 months ago Chest pain, unspecified type   [facility] Vigg, Avanti, MD   5 months ago Hyperthyroidism   [facility] Vigg, Avanti, MD   10 months ago Hyperthyroidism   [facility] Vigg, Avanti, MD   11 months ago Hyperthyroidism   [place] Vigg, Avanti, MD   11 months ago Constipation, unspecified constipation type   [place], [place]       Future Appointments             In 3 weeks Kathrine Haddock, NP [place], [facility]               Requested Prescriptions  Pending Prescriptions Disp Refills   mirtazapine (REMERON) 30 MG tablet [Pharmacy Med Name: MIRTAZAPINE 30 MG TABLET] 90 tablet 4    Sig: TAKE 1 TABLET (30 MG TOTAL) BY MOUTH AT BEDTIME. PLEASE SCHEDULE OFFICE VISIT BEFORE ANY FUTURE REFILLS.     Psychiatry: Antidepressants - mirtazapine Passed - 09/04/2021  1:53 AM      Passed - Valid encounter within last 6 months    Recent Outpatient Visits           2 months ago Chest pain, unspecified type   [facility] Vigg, Avanti, MD   5 months ago Hyperthyroidism   [facility] Vigg, Avanti, MD   10 months ago Hyperthyroidism   [facility] Vigg, Avanti, MD   11 months ago Hyperthyroidism    [place] Vigg, Avanti, MD   11 months ago Constipation, unspecified constipation type   [place], MD       Future Appointments             In 3 weeks Kathrine Haddock, NP [facility], [place]

## 2021-09-26 ENCOUNTER — Ambulatory Visit: Payer: Self-pay | Admitting: Unknown Physician Specialty

## 2022-07-30 IMAGING — CR DG CHEST 2V
1 series · 2 of 2 positions shown · non-contrast
Comparison: 01/14/2021

CLINICAL DATA: Chest pain, shortness of breath

EXAM:
CHEST - 2 VIEW

[Series 1: dg chest 2 view · 0.14mm/px · 2 of 2 slices shown]
[im 1/2]
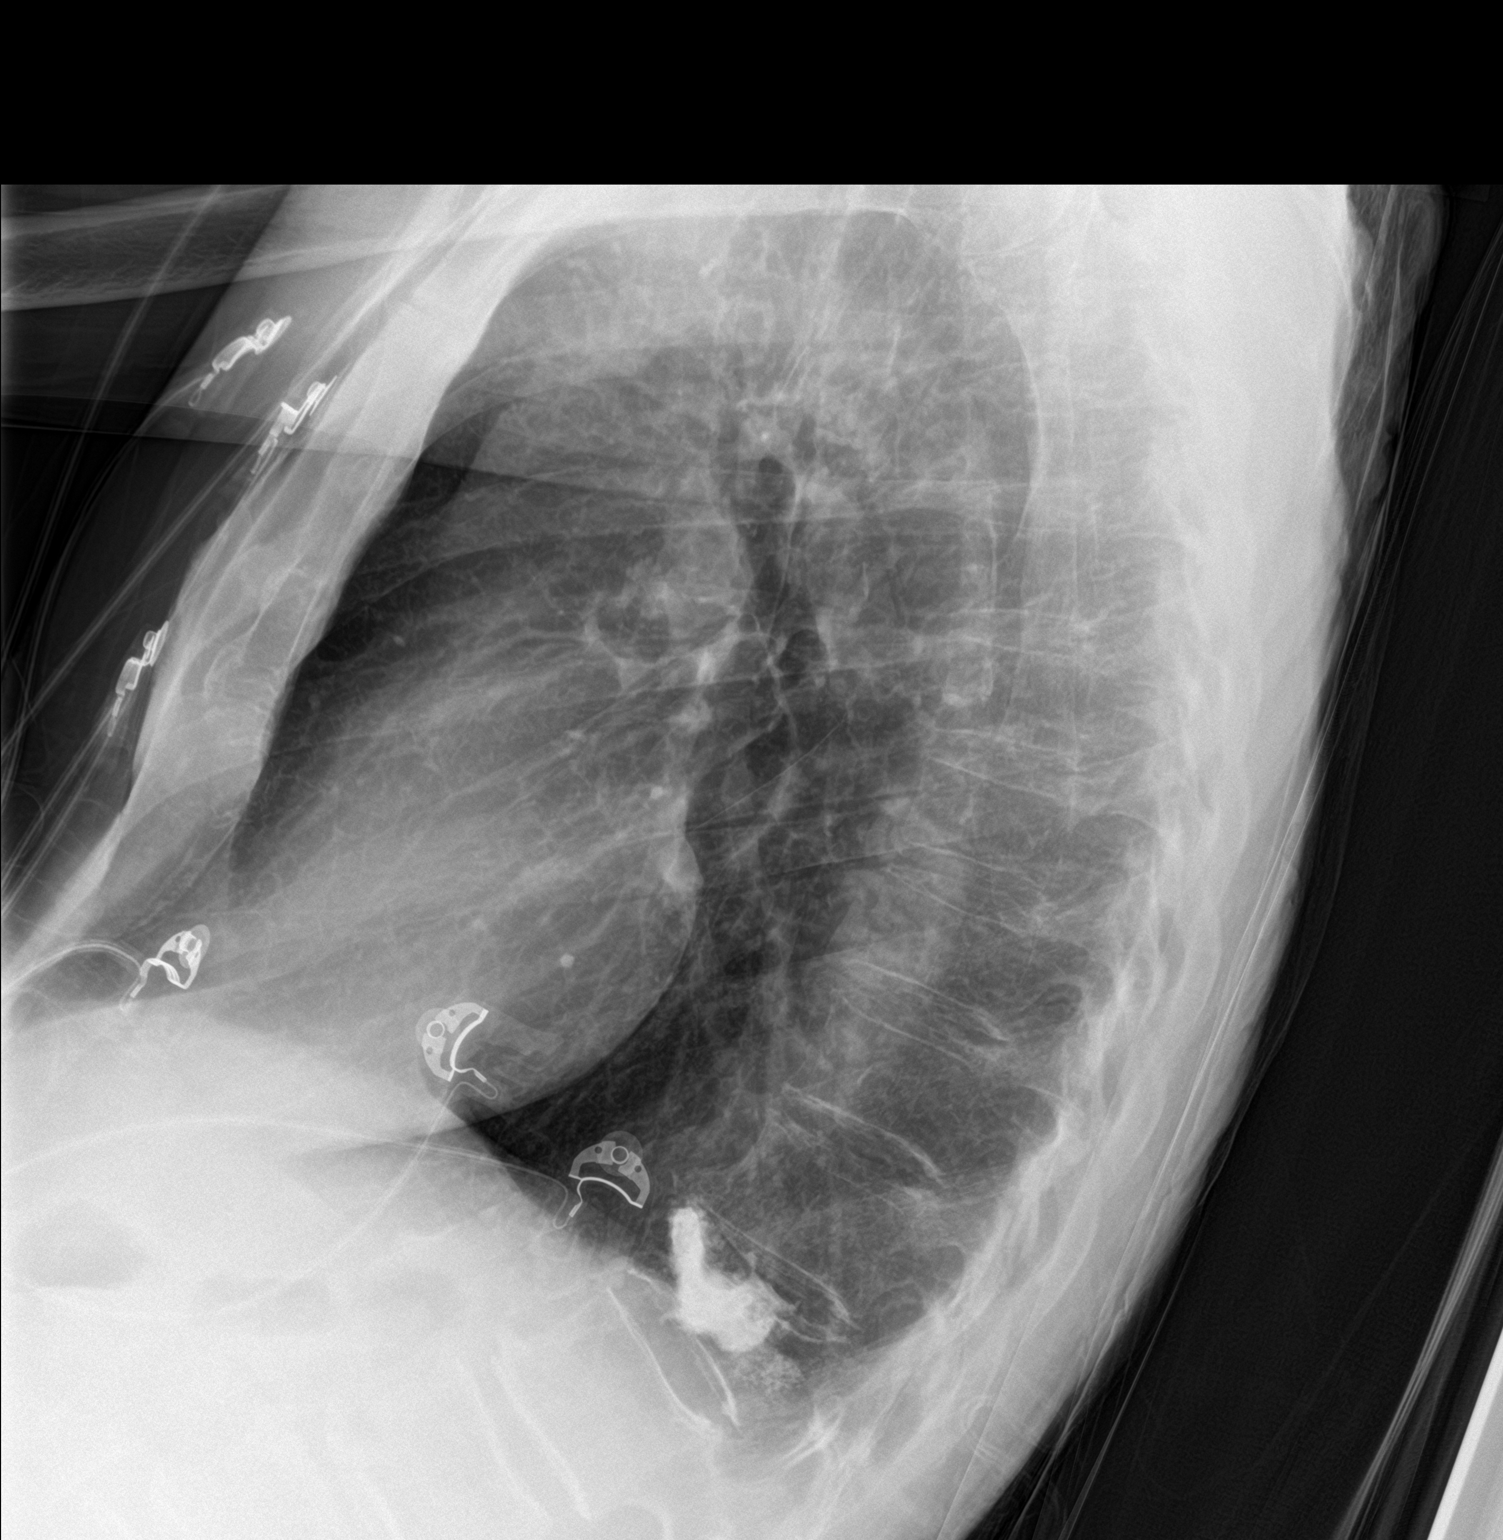
[im 2/2]
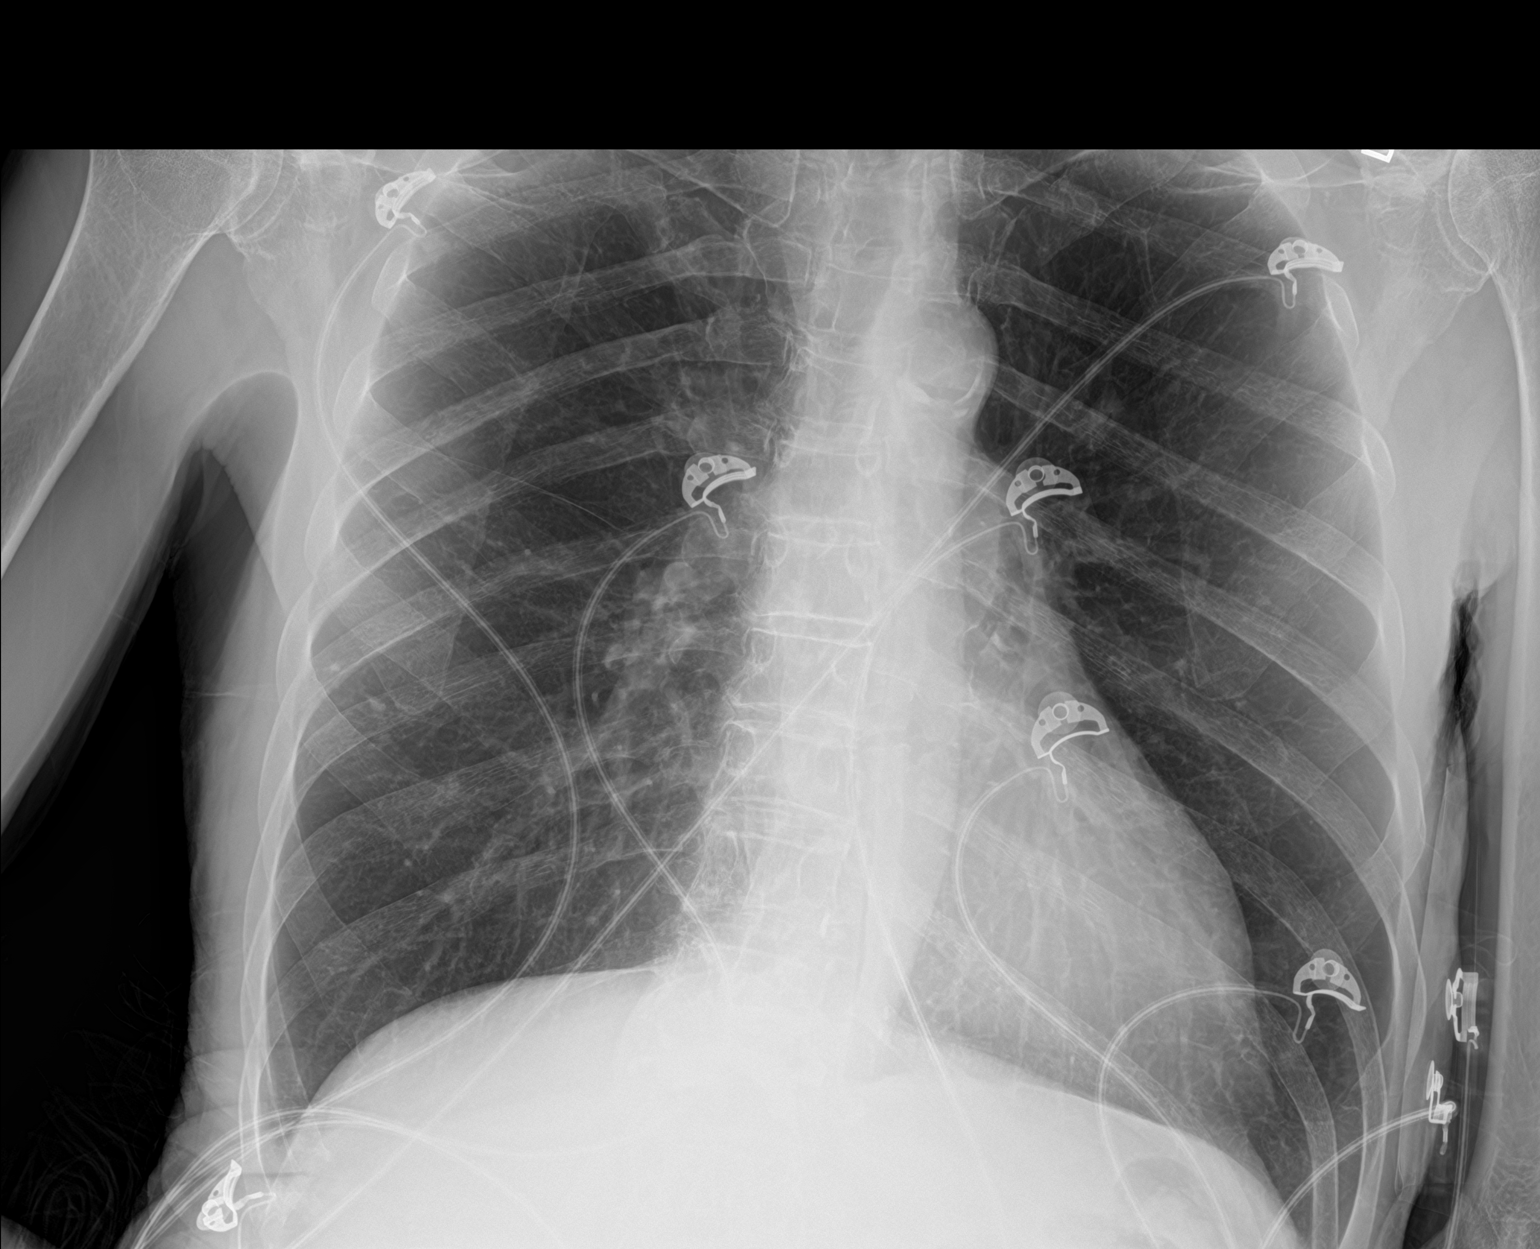

[2 of 2 positions shown; findings below may reference images not displayed]

FINDINGS: Cardiac size is within normal limits. Increase in AP diameter of
chest suggests COPD. There are no signs of pulmonary edema or new
focal infiltrates. There is no pleural effusion or pneumothorax.
There is previous kyphoplasty in 1 of the lower thoracic vertebral
bodies with no significant change.
IMPRESSION: COPD.  There are no new infiltrates or signs of pulmonary edema.
